# Patient Record
Sex: Male | Born: 1945
Health system: Southern US, Community
[De-identification: ages and names within clinical notes are randomized; demographics above are authoritative.]

## PROBLEM LIST (undated history)

## (undated) DIAGNOSIS — C61 Malignant neoplasm of prostate: Secondary | ICD-10-CM

## (undated) DIAGNOSIS — R351 Nocturia: Secondary | ICD-10-CM

## (undated) DIAGNOSIS — E119 Type 2 diabetes mellitus without complications: Secondary | ICD-10-CM

## (undated) DIAGNOSIS — Z923 Personal history of irradiation: Secondary | ICD-10-CM

## (undated) DIAGNOSIS — E785 Hyperlipidemia, unspecified: Secondary | ICD-10-CM

## (undated) DIAGNOSIS — H9313 Tinnitus, bilateral: Secondary | ICD-10-CM

## (undated) DIAGNOSIS — F1021 Alcohol dependence, in remission: Secondary | ICD-10-CM

## (undated) DIAGNOSIS — T884XXA Failed or difficult intubation, initial encounter: Secondary | ICD-10-CM

## (undated) HISTORY — DX: Failed or difficult intubation, initial encounter: T88.4XXA

## (undated) HISTORY — DX: Hyperlipidemia, unspecified: E78.5

---

## 1998-02-09 ENCOUNTER — Ambulatory Visit (HOSPITAL_COMMUNITY): Admission: RE | Admit: 1998-02-09 | Discharge: 1998-02-09 | Payer: Self-pay | Admitting: Family Medicine

## 2002-02-07 HISTORY — PX: COLONOSCOPY: SHX174

## 2005-06-29 ENCOUNTER — Ambulatory Visit: Payer: Self-pay | Admitting: Internal Medicine

## 2005-07-05 ENCOUNTER — Ambulatory Visit: Payer: Self-pay | Admitting: Internal Medicine

## 2005-07-28 ENCOUNTER — Ambulatory Visit: Payer: Self-pay | Admitting: Internal Medicine

## 2005-08-02 ENCOUNTER — Encounter: Admission: RE | Admit: 2005-08-02 | Discharge: 2005-10-31 | Payer: Self-pay | Admitting: Internal Medicine

## 2005-11-25 ENCOUNTER — Ambulatory Visit: Payer: Self-pay | Admitting: Internal Medicine

## 2005-11-25 LAB — CONVERTED CEMR LAB
ALT: 16 units/L (ref 0–40)
AST: 25 units/L (ref 0–37)
Creatinine,U: 58 mg/dL
Hgb A1c MFr Bld: 7 % — ABNORMAL HIGH (ref 4.6–6.0)
Microalb Creat Ratio: 3.4 mg/g (ref 0.0–30.0)
Microalb, Ur: 0.2 mg/dL (ref 0.0–1.9)

## 2005-11-29 ENCOUNTER — Encounter: Admission: RE | Admit: 2005-11-29 | Discharge: 2006-02-27 | Payer: Self-pay | Admitting: Internal Medicine

## 2011-10-27 ENCOUNTER — Encounter: Payer: Self-pay | Admitting: Gastroenterology

## 2011-10-28 ENCOUNTER — Encounter: Payer: Self-pay | Admitting: Internal Medicine

## 2011-10-28 DIAGNOSIS — E1165 Type 2 diabetes mellitus with hyperglycemia: Secondary | ICD-10-CM

## 2011-10-28 DIAGNOSIS — E119 Type 2 diabetes mellitus without complications: Secondary | ICD-10-CM | POA: Insufficient documentation

## 2011-10-28 DIAGNOSIS — Z0000 Encounter for general adult medical examination without abnormal findings: Secondary | ICD-10-CM | POA: Insufficient documentation

## 2011-11-02 ENCOUNTER — Encounter: Payer: Self-pay | Admitting: Internal Medicine

## 2011-11-02 ENCOUNTER — Other Ambulatory Visit (INDEPENDENT_AMBULATORY_CARE_PROVIDER_SITE_OTHER): Payer: Medicare HMO

## 2011-11-02 ENCOUNTER — Ambulatory Visit (INDEPENDENT_AMBULATORY_CARE_PROVIDER_SITE_OTHER): Payer: Medicare HMO | Admitting: Internal Medicine

## 2011-11-02 ENCOUNTER — Other Ambulatory Visit: Payer: Self-pay | Admitting: Internal Medicine

## 2011-11-02 VITALS — BP 142/98 | HR 67 | Temp 97.1°F | Ht 71.0 in | Wt 193.5 lb

## 2011-11-02 DIAGNOSIS — F22 Delusional disorders: Secondary | ICD-10-CM

## 2011-11-02 DIAGNOSIS — R972 Elevated prostate specific antigen [PSA]: Secondary | ICD-10-CM

## 2011-11-02 DIAGNOSIS — R030 Elevated blood-pressure reading, without diagnosis of hypertension: Secondary | ICD-10-CM

## 2011-11-02 DIAGNOSIS — Z125 Encounter for screening for malignant neoplasm of prostate: Secondary | ICD-10-CM

## 2011-11-02 DIAGNOSIS — Z0000 Encounter for general adult medical examination without abnormal findings: Secondary | ICD-10-CM

## 2011-11-02 DIAGNOSIS — IMO0001 Reserved for inherently not codable concepts without codable children: Secondary | ICD-10-CM

## 2011-11-02 DIAGNOSIS — Z7289 Other problems related to lifestyle: Secondary | ICD-10-CM | POA: Insufficient documentation

## 2011-11-02 LAB — CBC WITH DIFFERENTIAL/PLATELET
Basophils Relative: 0.6 % (ref 0.0–3.0)
Eosinophils Absolute: 0.1 10*3/uL (ref 0.0–0.7)
Eosinophils Relative: 1 % (ref 0.0–5.0)
Hemoglobin: 14 g/dL (ref 13.0–17.0)
Lymphocytes Relative: 15.2 % (ref 12.0–46.0)
MCHC: 32 g/dL (ref 30.0–36.0)
Neutro Abs: 5.3 10*3/uL (ref 1.4–7.7)
Neutrophils Relative %: 76.9 % (ref 43.0–77.0)
RBC: 5.34 Mil/uL (ref 4.22–5.81)
WBC: 6.8 10*3/uL (ref 4.5–10.5)

## 2011-11-02 LAB — URINALYSIS, ROUTINE W REFLEX MICROSCOPIC
Ketones, ur: NEGATIVE
Specific Gravity, Urine: 1.01 (ref 1.000–1.030)
Total Protein, Urine: NEGATIVE
Urine Glucose: NEGATIVE
Urobilinogen, UA: 0.2 (ref 0.0–1.0)
pH: 7 (ref 5.0–8.0)

## 2011-11-02 LAB — BASIC METABOLIC PANEL
BUN: 16 mg/dL (ref 6–23)
Calcium: 9.5 mg/dL (ref 8.4–10.5)
Creatinine, Ser: 1 mg/dL (ref 0.4–1.5)

## 2011-11-02 LAB — HEPATIC FUNCTION PANEL
ALT: 14 U/L (ref 0–53)
Bilirubin, Direct: 0.1 mg/dL (ref 0.0–0.3)
Total Bilirubin: 0.6 mg/dL (ref 0.3–1.2)
Total Protein: 7.5 g/dL (ref 6.0–8.3)

## 2011-11-02 LAB — LIPID PANEL
Cholesterol: 183 mg/dL (ref 0–200)
HDL: 55 mg/dL (ref >39.00)
LDL Cholesterol: 116 mg/dL — ABNORMAL HIGH (ref 0–99)
Total CHOL/HDL Ratio: 3
Triglycerides: 60 mg/dL (ref 0.0–149.0)

## 2011-11-02 LAB — PSA: PSA: 5.19 ng/mL — ABNORMAL HIGH (ref 0.10–4.00)

## 2011-11-02 LAB — TSH: TSH: 0.34 u[IU]/mL — ABNORMAL LOW (ref 0.35–5.50)

## 2011-11-02 LAB — HEMOGLOBIN A1C: Hgb A1c MFr Bld: 7.1 % — ABNORMAL HIGH (ref 4.6–6.5)

## 2011-11-02 LAB — MICROALBUMIN / CREATININE URINE RATIO: Microalb Creat Ratio: 1.6 mg/g (ref 0.0–30.0)

## 2011-11-02 MED ORDER — ASPIRIN 81 MG PO TBEC: 81.0000 mg | DELAYED_RELEASE_TABLET | Freq: Every day | ORAL | Status: DC

## 2011-11-02 NOTE — Assessment & Plan Note (Addendum)
Overall doing well, age appropriate education and counseling updated, referrals for preventative services and immunizations addressed, dietary and smoking counseling addressed, most recent labs and ECG reviewed.  I have personally reviewed and have noted: 1) the patient's medical and social history 2) The pt's use of alcohol, tobacco, and illicit drugs 3) The patient's current medications and supplements 4) Functional ability including ADL's, fall risk, home safety risk, hearing and visual impairment 5) Diet and physical activities 6) Evidence for depression or mood disorder 7) The patient's height, weight, and BMI have been recorded in the chart I have made referrals, and provided counseling and education based on review of the above Due for colonoscopy  - will refer, ECG reviewed as per emr

## 2011-11-02 NOTE — Patient Instructions (Addendum)
Please start the Aspirin at 81 mg per day - Enteric Coated only Please call if you change your mind about medication for blood pressure, and the flu shot, and the pneumonia shot Please check your blood pressure on a regular basis; your goal is to be less than 140/90 Please go to LAB in the Basement for the blood and/or urine tests to be done today You will be contacted by phone if any changes need to be made immediately.  Otherwise, you will receive a letter about your results with an explanation. Please remember to sign up for My Chart at your earliest convenience, as this will be important to you in the future with finding out test results. You will be contacted regarding the referral for: colonoscopy Please return in 1 year for your yearly visit, or sooner if needed, with Lab testing done 3-5 days before

## 2011-11-05 ENCOUNTER — Encounter: Payer: Self-pay | Admitting: Internal Medicine

## 2011-11-05 DIAGNOSIS — R030 Elevated blood-pressure reading, without diagnosis of hypertension: Secondary | ICD-10-CM | POA: Insufficient documentation

## 2011-11-05 NOTE — Assessment & Plan Note (Signed)
Pt declines low dose ace/arb today,  to f/u any worsening symptoms or concerns

## 2011-11-05 NOTE — Progress Notes (Signed)
Subjective:    Patient ID: Samuel Krueger, male    DOB: Jan 26, 1946, 66 y.o.   MRN: [id number]  HPI  Here for wellness and to establish as new pt;  Overall doing ok;  Pt denies CP, worsening SOB, DOE, wheezing, orthopnea, PND, worsening LE edema, palpitations, dizziness or syncope.  Pt denies neurological change such as new Headache, facial or extremity weakness.  Pt denies polydipsia, polyuria, or low sugar symptoms. Pt states overall good compliance with treatment and medications, good tolerability, and trying to follow lower cholesterol diet.  Pt denies worsening depressive symptoms, suicidal ideation or panic. No fever, wt loss, night sweats, loss of appetite, or other constitutional symptoms.  Pt states good ability with ADL's, low fall risk, home safety reviewed and adequate, no significant changes in hearing or vision, and occasionally active with exercise.  No acute complaints.  Due for colonoscopy, declines immunizations.  Past Medical History  Diagnosis Date  . Type II or unspecified type diabetes mellitus without mention of complication, uncontrolled 10/28/2011  . Abuse, drug or alcohol   . Alcohol use 11/02/2011    None for 20 yrs, s/p rehab  . Elevated PSA 11/02/2011   History reviewed. No pertinent past surgical history.  reports that he has never smoked. He has never used smokeless tobacco. He reports that he does not drink alcohol or use illicit drugs. family history includes Alcohol abuse in his other; Heart disease in his other; Hypertension in his other; and Mental illness in his other. Allergies  Allergen Reactions  . Metformin And Related Other (See Comments)    Chest tightness   No current outpatient prescriptions on file prior to visit.   Review of Systems Review of Systems  Constitutional: Negative for diaphoresis, activity change, appetite change and unexpected weight change.  HENT: Negative for hearing loss, ear pain, facial swelling, mouth sores and neck stiffness.     Eyes: Negative for pain, redness and visual disturbance.  Respiratory: Negative for shortness of breath and wheezing.   Cardiovascular: Negative for chest pain and palpitations.  Gastrointestinal: Negative for diarrhea, blood in stool, abdominal distention and rectal pain.  Genitourinary: Negative for hematuria, flank pain and decreased urine volume.  Musculoskeletal: Negative for myalgias and joint swelling.  Skin: Negative for color change and wound.  Neurological: Negative for syncope and numbness.  Hematological: Negative for adenopathy.  Psychiatric/Behavioral: Negative for hallucinations, self-injury, decreased concentration and agitation.      Objective:   Physical Exam BP 142/98  Pulse 67  Temp 97.1 F (36.2 C) (Oral)  Ht 5\' 11"  (1.803 m)  Wt 193 lb 8 oz (87.771 kg)  BMI 26.99 kg/m2  SpO2 98% Physical Exam  VS noted Constitutional: Pt is oriented to person, place, and time. Appears well-developed and well-nourished.  HENT:  Head: Normocephalic and atraumatic.  Right Ear: External ear normal.  Left Ear: External ear normal.  Nose: Nose normal.  Mouth/Throat: Oropharynx is clear and moist.  Eyes: Conjunctivae and EOM are normal. Pupils are equal, round, and reactive to light.  Neck: Normal range of motion. Neck supple. No JVD present. No tracheal deviation present.  Cardiovascular: Normal rate, regular rhythm, normal heart sounds and intact distal pulses.   Pulmonary/Chest: Effort normal and breath sounds normal.  Abdominal: Soft. Bowel sounds are normal. There is no tenderness.  Musculoskeletal: Normal range of motion. Exhibits no edema.  Lymphadenopathy:  Has no cervical adenopathy.  Neurological: Pt is alert and oriented to person, place, and time. Pt has normal reflexes.  No cranial nerve deficit.  Skin: Skin is warm and dry. No rash noted.  Psychiatric:  Has  normal mood and affect. Behavior is normal.     Assessment & Plan:

## 2011-11-05 NOTE — Assessment & Plan Note (Signed)
Diet controlled, to check a1c

## 2011-11-10 ENCOUNTER — Encounter: Payer: Self-pay | Admitting: Internal Medicine

## 2012-03-24 ENCOUNTER — Other Ambulatory Visit: Payer: Self-pay

## 2012-09-12 ENCOUNTER — Other Ambulatory Visit: Payer: Self-pay

## 2012-12-13 ENCOUNTER — Other Ambulatory Visit: Payer: Self-pay

## 2014-03-25 ENCOUNTER — Ambulatory Visit: Payer: Medicare HMO | Admitting: Internal Medicine

## 2014-03-26 DIAGNOSIS — C61 Malignant neoplasm of prostate: Secondary | ICD-10-CM | POA: Diagnosis not present

## 2014-03-26 HISTORY — PX: PROSTATE BIOPSY: SHX241

## 2014-05-19 DIAGNOSIS — C61 Malignant neoplasm of prostate: Secondary | ICD-10-CM | POA: Diagnosis not present

## 2014-05-21 ENCOUNTER — Encounter: Payer: Self-pay | Admitting: Radiation Oncology

## 2014-05-21 NOTE — Progress Notes (Signed)
GU Location of Tumor / Histology: Adenocarcinoma of the Prostate   If Prostate Cancer, Gleason Score is (3 + 4)  In 5 and 10% of 2 cores at the right apex and gleason 6 at the left apex with HGPIN at the right base. PSA is (7.75)  Frazier Richards     Past/Anticipated interventions by urology, if any:  Dr. Lowella Bandy - Biopsy of the Prostate  Past/Anticipated interventions by medical oncology, if any: None  Weight changes, if any:   Bowel/Bladder complaints, if any:  Nocturia x 2, frequency, weak stream intermittently.    Nausea/Vomiting, if any:   Pain issues, if any:    SAFETY ISSUES:  Prior radiation? No  Pacemaker/ICD? No  Possible current pregnancy? N/A  Is the patient on methotrexate? No  Current Complaints / other details:

## 2014-05-22 ENCOUNTER — Ambulatory Visit
Admission: RE | Admit: 2014-05-22 | Discharge: 2014-05-22 | Disposition: A | Discharge status: Home / Self Care | Payer: Commercial Managed Care - HMO | Source: Ambulatory Visit | Attending: Radiation Oncology | Admitting: Radiation Oncology

## 2014-05-22 ENCOUNTER — Ambulatory Visit: Payer: Commercial Managed Care - HMO

## 2014-05-22 DIAGNOSIS — C61 Malignant neoplasm of prostate: Secondary | ICD-10-CM | POA: Insufficient documentation

## 2014-05-22 HISTORY — DX: Malignant neoplasm of prostate: C61

## 2014-05-27 ENCOUNTER — Encounter: Payer: Self-pay | Admitting: Radiation Oncology

## 2014-05-27 ENCOUNTER — Ambulatory Visit
Admission: RE | Admit: 2014-05-27 | Discharge: 2014-05-27 | Disposition: A | Discharge status: Home / Self Care | Payer: Commercial Managed Care - HMO | Source: Ambulatory Visit | Attending: Radiation Oncology | Admitting: Radiation Oncology

## 2014-05-27 VITALS — BP 140/83 | HR 77 | Temp 98.6°F | Ht 71.0 in | Wt 194.8 lb

## 2014-05-27 DIAGNOSIS — C61 Malignant neoplasm of prostate: Secondary | ICD-10-CM | POA: Insufficient documentation

## 2014-05-27 NOTE — Progress Notes (Signed)
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 male who is seen today through the courtesy of Dr. Janice Norrie for discussion of possible radiation therapy in the management of his stage TIc intermediate risk adenocarcinoma prostate.  He presented with a PSA of 5.7 and was found have Gleason 6 (3+3) disease in 3 of 12 biopsies.  He elected for active surveillance but was poorly compliant for follow-up.  He returned for follow-up in November 2015 and his PSA was 7.75.  He underwent ultrasound-guided biopsies on 03/26/2014 and he was found have Gleason 7 (3+4) involving 5% and 10% of 2 cores from right lateral apex and Gleason 6 (3+3) in 5% of one core from the left lateral apex.  He is doing well from a GU and GI standpoint.  He states that he is able to have erections but he is not sexually active.  His I PSS score today is 8.  PREVIOUS RADIATION THERAPY: No   PAST MEDICAL HISTORY:  has a past medical history of Type II or unspecified type diabetes mellitus without mention of complication, uncontrolled (10/28/2011); Abuse, drug or alcohol; Alcohol use (11/02/2011); Elevated PSA (11/02/2011); and Prostate cancer.     PAST SURGICAL HISTORY:  Past Surgical History  Procedure Laterality Date  . Prostate biopsy  03/26/14     FAMILY HISTORY: family history includes Alcohol abuse in his mother; HIV/AIDS in his brother; Heart disease in his mother; Hypertension in his mother; Mental illness in his other.     SOCIAL HISTORY:  reports that he has never smoked. He has never used smokeless tobacco. He reports that he does not drink alcohol or use illicit drugs.  Widower for the past 32  years, one daughter age 62, 2 grandchildren ages 57 and 6.  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   ALLERGIES: Metformin and related   MEDICATIONS:  Current Outpatient Prescriptions  Medication Sig Dispense Refill  . aspirin 81 MG EC tablet Take 1 tablet (81 mg total) by mouth daily. Swallow whole. 30 tablet 12   No current facility-administered medications for this encounter.     REVIEW OF SYSTEMS:  Pertinent items are noted in HPI.    PHYSICAL EXAM:  height is 5\' 11"  (1.803 m) and weight is 194 lb 12.8 oz (88.361 kg). His temperature is 98.6 F (37 C). His blood pressure is 140/83 and his pulse is 77.   Alert and oriented 69 year old African-American male appearing younger than his stated age.  Nodes: Without palpable cervical or supraclavicular lymphadenopathy.  Chest: Lungs clear.  Abdomen: Without masses organomegaly.  Genitalia: Unremarkable to inspection.  Rectal: The prostate gland is slightly enlarged and is without focal induration or nodularity.  Extremities: Without edema.   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  I explained to the patient that his prognosis is related to his stage, PSA level, and Gleason score.  His stage and PSA level are favorable while his Gleason score of 7 is of intermediate favorability.  Other prognostic factors include disease volume.  He has low volume Gleason 7.  Management options include robotic surgery, continued active surveillance, or  radiation therapy.  Radiation therapy options include seed implantation with or without 5 weeks of external beam radiation therapy (3-D conformal) or 8 weeks of external beam/IMRT.  Since he does have low volume disease, I feel that he would be a candidate for seed implantation alone.  We discussed the potential acute and late toxicities of radiation therapy.  He does have a large gland volume, but he may not need downsizing if he has an open pubic arch.  After careful consideration, he would like to consider seed implantation alone.  We will move ahead with a CT arch study today.  Consent is signed today.  If he has a narrow arch relative to his prostate volume then he may be a candidate for downsizing of his gland for 3 months with androgen deprivation therapy.  We can discuss this further depending on the results of his CT arch study.   PLAN: As discussed above.  We will move ahead with his CT arch study.   I spent 45  minutes face to face with the patient and more than 50% of that time was spent in counseling and/or coordination of care.

## 2014-05-27 NOTE — Progress Notes (Signed)
CC: Dr. Lowella Bandy  Treatment planning/simulation note: The patient was taken to the CT simulator.  His pelvis was scanned.  The CT data set was sent to the planning system where contoured his prostate.  This was projected over the pubic arch.  His pubic arch is open.  His prostate volume is 66.6 mL compared to Dr. Sammie Bench measurement of 57.5 mL.  He is a candidate for seed implantation.  I plan to deliver 14,500 cGy utilizing I-125 seeds with the [employer] system.  We will get him scheduled with Dr. Janice Norrie in approximately 4-6 weeks.

## 2014-05-27 NOTE — Addendum Note (Signed)
Encounter addended by: Benn Moulder, RN on: 05/27/2014  3:27 PM<BR>     Documentation filed: Charges VN, Flowsheet VN

## 2014-05-28 ENCOUNTER — Telehealth: Payer: Self-pay | Admitting: *Deleted

## 2014-05-28 ENCOUNTER — Other Ambulatory Visit: Payer: Self-pay | Admitting: Urology

## 2014-05-28 NOTE — Telephone Encounter (Signed)
CALLED PATIENT TO INFORM OF IMPLANT DATE, LVM FOR A RETURN CALL 

## 2014-05-29 ENCOUNTER — Encounter (HOSPITAL_BASED_OUTPATIENT_CLINIC_OR_DEPARTMENT_OTHER): Admission: RE | Admit: 2014-05-29 | Payer: Commercial Managed Care - HMO | Source: Ambulatory Visit

## 2014-05-29 ENCOUNTER — Telehealth: Payer: Self-pay | Admitting: *Deleted

## 2014-05-29 ENCOUNTER — Ambulatory Visit (HOSPITAL_BASED_OUTPATIENT_CLINIC_OR_DEPARTMENT_OTHER)
Admission: RE | Admit: 2014-05-29 | Discharge: 2014-05-29 | Disposition: A | Discharge status: Home / Self Care | Payer: Commercial Managed Care - HMO | Source: Ambulatory Visit | Attending: Urology | Admitting: Urology

## 2014-05-29 DIAGNOSIS — E119 Type 2 diabetes mellitus without complications: Secondary | ICD-10-CM | POA: Insufficient documentation

## 2014-05-29 NOTE — Telephone Encounter (Signed)
Called patient to inform that implant has been moved from 07-08-14 to 07-15-14, spoke with patient and he is aware of this change.

## 2014-07-03 ENCOUNTER — Telehealth: Payer: Self-pay | Admitting: *Deleted

## 2014-07-03 NOTE — Telephone Encounter (Signed)
CALLED PATIENT TO REMIND OF LABS FOR 07-08-14 FOR IMPLANT ON 07-15-14, SPOKE WITH PATIENT AND HE IS AWARE OF THESE LABS

## 2014-07-04 ENCOUNTER — Encounter (HOSPITAL_BASED_OUTPATIENT_CLINIC_OR_DEPARTMENT_OTHER): Payer: Self-pay | Admitting: *Deleted

## 2014-07-08 ENCOUNTER — Encounter (HOSPITAL_BASED_OUTPATIENT_CLINIC_OR_DEPARTMENT_OTHER): Payer: Self-pay | Admitting: *Deleted

## 2014-07-08 DIAGNOSIS — R351 Nocturia: Secondary | ICD-10-CM | POA: Diagnosis not present

## 2014-07-08 DIAGNOSIS — Z91013 Allergy to seafood: Secondary | ICD-10-CM | POA: Diagnosis not present

## 2014-07-08 DIAGNOSIS — Z888 Allergy status to other drugs, medicaments and biological substances status: Secondary | ICD-10-CM | POA: Diagnosis not present

## 2014-07-08 DIAGNOSIS — Z7982 Long term (current) use of aspirin: Secondary | ICD-10-CM | POA: Diagnosis not present

## 2014-07-08 DIAGNOSIS — E119 Type 2 diabetes mellitus without complications: Secondary | ICD-10-CM | POA: Diagnosis not present

## 2014-07-08 DIAGNOSIS — C61 Malignant neoplasm of prostate: Secondary | ICD-10-CM | POA: Diagnosis not present

## 2014-07-08 DIAGNOSIS — Z87891 Personal history of nicotine dependence: Secondary | ICD-10-CM | POA: Diagnosis not present

## 2014-07-08 DIAGNOSIS — F1010 Alcohol abuse, uncomplicated: Secondary | ICD-10-CM | POA: Diagnosis not present

## 2014-07-08 LAB — CBC
HEMATOCRIT: 43.5 % (ref 39.0–52.0)
HEMOGLOBIN: 13.8 g/dL (ref 13.0–17.0)
MCH: 26.1 pg (ref 26.0–34.0)
MCHC: 31.7 g/dL (ref 30.0–36.0)
MCV: 82.2 fL (ref 78.0–100.0)
Platelets: 175 10*3/uL (ref 150–400)
RBC: 5.29 MIL/uL (ref 4.22–5.81)
RDW: 13.5 % (ref 11.5–15.5)
WBC: 7.4 10*3/uL (ref 4.0–10.5)

## 2014-07-08 LAB — COMPREHENSIVE METABOLIC PANEL
ALT: 13 U/L — AB (ref 17–63)
AST: 22 U/L (ref 15–41)
Albumin: 4.3 g/dL (ref 3.5–5.0)
Alkaline Phosphatase: 50 U/L (ref 38–126)
Anion gap: 9 (ref 5–15)
BUN: 17 mg/dL (ref 6–20)
CALCIUM: 9.5 mg/dL (ref 8.9–10.3)
CO2: 27 mmol/L (ref 22–32)
CREATININE: 0.94 mg/dL (ref 0.61–1.24)
Chloride: 104 mmol/L (ref 101–111)
GFR calc Af Amer: >60 mL/min (ref >60)
GFR calc non Af Amer: >60 mL/min (ref >60)
Glucose, Bld: 162 mg/dL — ABNORMAL HIGH (ref 65–99)
POTASSIUM: 4.2 mmol/L (ref 3.5–5.1)
Sodium: 140 mmol/L (ref 135–145)
Total Bilirubin: 0.5 mg/dL (ref 0.3–1.2)
Total Protein: 7.8 g/dL (ref 6.5–8.1)

## 2014-07-08 LAB — PROTIME-INR
INR: 0.99 (ref 0.00–1.49)
PROTHROMBIN TIME: 13.3 s (ref 11.6–15.2)

## 2014-07-08 LAB — APTT: aPTT: 33 seconds (ref 24–37)

## 2014-07-08 NOTE — Progress Notes (Signed)
NPO AFTER MN.  ARRIVE AT 0800.  CURRENT LAB WORK DONE TODAY, CXR , AND EKG IN CHART AND EPIC. WILL TAKE FLEET ENEMA AM DOS.

## 2014-07-11 ENCOUNTER — Telehealth: Payer: Self-pay | Admitting: *Deleted

## 2014-07-11 NOTE — Telephone Encounter (Signed)
CALLED PATIENT TO REMIND OF PROCEDURE FOR 07-15-14, SPOKE WITH PATIENT AND HE IS AWARE OF THIS PROCEDURE.

## 2014-07-14 DIAGNOSIS — C61 Malignant neoplasm of prostate: Secondary | ICD-10-CM | POA: Diagnosis not present

## 2014-07-14 NOTE — H&P (Signed)
Urology Admission H&P  Chief Complaint: Adenocarcinoma of prostate, stage TIc  History of Present Illness: Patient is a 69 years old male who was diagnosed with adenocarcinoma of prostate Gleason score 6 in February 2014. His PSA at that time was 5.71. He did not return for his follow-up visit. He came back to the office 2 years later. He came back to the office 2 years later. In February 2016 he had another prostate biopsy that revealed Gleason 3+4 at the right apex and 3+3 at the left apex. He was seen in consultation by Dr. Valere Dross and he elected then to have seeds implantation. He is scheduled today for the procedure.  Past Medical History  Diagnosis Date  . Recovering alcoholic in remission     since rehab 20 yrs ago-- approx 1995  . Prostate cancer urologist-- dr Janice Norrie    active survellance since dx Feb 2014--  now has Stage T1c, Gleason (3+4)7, PSA 7.75  . Tinnitus of both ears   . Type 2 diabetes, diet controlled   . Nocturia    Past Surgical History  Procedure Laterality Date  . Prostate biopsy  03/26/14    Home Medications:  No prescriptions prior to admission   Allergies:  Allergies  Allergen Reactions  . Metformin And Related Other (See Comments)    Chest tightness  . Shrimp [Shellfish Allergy] Other (See Comments)    "strange GI reaction"    Family History  Problem Relation Age of Onset  . Alcohol abuse Mother   . Heart disease Mother   . Hypertension Mother   . Mental illness Other   . HIV/AIDS Brother    Social History:  reports that he quit smoking about 14 years ago. His smoking use included Cigarettes. He quit after .8 years of use. He has never used smokeless tobacco. He reports that he does not drink alcohol or use illicit drugs.  Review of Systems  Constitutional: Negative.   HENT: Negative.   Eyes: Negative.   Respiratory: Negative.   Cardiovascular: Negative.   Gastrointestinal: Negative.   Genitourinary: Negative.   Musculoskeletal: Negative.    Neurological: Negative.   Endo/Heme/Allergies: Negative.   Psychiatric/Behavioral: Negative.     Physical Exam:  Vital signs in last 24 hours:   Physical Exam  Constitutional: He is oriented to person, place, and time. He appears well-developed and well-nourished.  HENT:  Head: Normocephalic.  Eyes: Pupils are equal, round, and reactive to light.  Neck: Normal range of motion. No tracheal deviation present. No thyromegaly present.  Cardiovascular: Normal rate and normal heart sounds.  Exam reveals no gallop.   No murmur heard. Respiratory: Breath sounds normal. No respiratory distress. He exhibits no tenderness.  GI: Soft. Bowel sounds are normal. He exhibits no distension and no mass. There is no tenderness. There is no rebound and no guarding.  Genitourinary: Rectum normal, prostate normal and penis normal.  Musculoskeletal: Normal range of motion. He exhibits no edema.  Lymphadenopathy:    He has no cervical adenopathy.  Neurological: He is alert and oriented to person, place, and time.  Skin: Skin is warm and dry.  Psychiatric: He has a normal mood and affect.    Laboratory Data:  No results found for this or any previous visit (from the past 24 hour(s)). No results found for this or any previous visit (from the past 240 hour(s)). Creatinine:  Recent Labs  07/08/14 0849  CREATININE 0.94     Impression/Assessment:  Adenocarcinoma of prostate, stage TIc  Plan:  I-125 seeds implantation.  Arvil Persons 07/14/2014, 7:23 PM

## 2014-07-15 ENCOUNTER — Ambulatory Visit (HOSPITAL_COMMUNITY): Payer: Commercial Managed Care - HMO

## 2014-07-15 ENCOUNTER — Encounter: Payer: Self-pay | Admitting: Radiation Oncology

## 2014-07-15 ENCOUNTER — Encounter (HOSPITAL_BASED_OUTPATIENT_CLINIC_OR_DEPARTMENT_OTHER): Payer: Self-pay | Admitting: Anesthesiology

## 2014-07-15 ENCOUNTER — Ambulatory Visit (HOSPITAL_BASED_OUTPATIENT_CLINIC_OR_DEPARTMENT_OTHER): Payer: Commercial Managed Care - HMO | Admitting: Anesthesiology

## 2014-07-15 ENCOUNTER — Encounter (HOSPITAL_BASED_OUTPATIENT_CLINIC_OR_DEPARTMENT_OTHER)
Admission: RE | Disposition: A | Discharge status: Home / Self Care | Payer: Self-pay | Source: Ambulatory Visit | Attending: Urology

## 2014-07-15 ENCOUNTER — Ambulatory Visit (HOSPITAL_BASED_OUTPATIENT_CLINIC_OR_DEPARTMENT_OTHER)
Admission: RE | Admit: 2014-07-15 | Discharge: 2014-07-15 | Disposition: A | Discharge status: Home / Self Care | Payer: Commercial Managed Care - HMO | Source: Ambulatory Visit | Attending: Urology | Admitting: Urology

## 2014-07-15 DIAGNOSIS — Z91013 Allergy to seafood: Secondary | ICD-10-CM | POA: Diagnosis not present

## 2014-07-15 DIAGNOSIS — Z888 Allergy status to other drugs, medicaments and biological substances status: Secondary | ICD-10-CM | POA: Diagnosis not present

## 2014-07-15 DIAGNOSIS — Z87891 Personal history of nicotine dependence: Secondary | ICD-10-CM | POA: Insufficient documentation

## 2014-07-15 DIAGNOSIS — C61 Malignant neoplasm of prostate: Secondary | ICD-10-CM | POA: Insufficient documentation

## 2014-07-15 DIAGNOSIS — E119 Type 2 diabetes mellitus without complications: Secondary | ICD-10-CM | POA: Diagnosis not present

## 2014-07-15 DIAGNOSIS — Z923 Personal history of irradiation: Secondary | ICD-10-CM

## 2014-07-15 DIAGNOSIS — R351 Nocturia: Secondary | ICD-10-CM | POA: Insufficient documentation

## 2014-07-15 DIAGNOSIS — Z7982 Long term (current) use of aspirin: Secondary | ICD-10-CM | POA: Insufficient documentation

## 2014-07-15 DIAGNOSIS — Z01818 Encounter for other preprocedural examination: Secondary | ICD-10-CM

## 2014-07-15 DIAGNOSIS — F1010 Alcohol abuse, uncomplicated: Secondary | ICD-10-CM | POA: Insufficient documentation

## 2014-07-15 HISTORY — DX: Nocturia: R35.1

## 2014-07-15 HISTORY — DX: Alcohol dependence, in remission: F10.21

## 2014-07-15 HISTORY — DX: Personal history of irradiation: Z92.3

## 2014-07-15 HISTORY — DX: Type 2 diabetes mellitus without complications: E11.9

## 2014-07-15 HISTORY — DX: Tinnitus, bilateral: H93.13

## 2014-07-15 HISTORY — PX: RADIOACTIVE SEED IMPLANT: SHX5150

## 2014-07-15 LAB — GLUCOSE, CAPILLARY
Glucose-Capillary: 95 mg/dL (ref 65–99)
Glucose-Capillary: 172 mg/dL — ABNORMAL HIGH (ref 65–99)

## 2014-07-15 SURGERY — INSERTION, RADIATION SOURCE, PROSTATE
Anesthesia: General | Site: Rectum

## 2014-07-15 MED ORDER — LACTATED RINGERS IV SOLN
INTRAVENOUS | Status: DC
  Administered 2014-07-15 (×3): via INTRAVENOUS
  Filled 2014-07-15: qty 1000

## 2014-07-15 MED ORDER — EPHEDRINE SULFATE 50 MG/ML IJ SOLN
INTRAMUSCULAR | Status: DC | PRN
  Administered 2014-07-15: 10 mg via INTRAVENOUS

## 2014-07-15 MED ORDER — IOHEXOL 350 MG/ML SOLN
INTRAVENOUS | Status: DC | PRN
  Administered 2014-07-15: 7 mL

## 2014-07-15 MED ORDER — SUCCINYLCHOLINE CHLORIDE 20 MG/ML IJ SOLN
INTRAMUSCULAR | Status: DC | PRN
  Administered 2014-07-15: 100 mg via INTRAVENOUS

## 2014-07-15 MED ORDER — ACETAMINOPHEN 10 MG/ML IV SOLN
INTRAVENOUS | Status: DC | PRN
  Administered 2014-07-15: 1000 mg via INTRAVENOUS

## 2014-07-15 MED ORDER — ONDANSETRON HCL 4 MG/2ML IJ SOLN
INTRAMUSCULAR | Status: DC | PRN
  Administered 2014-07-15: 4 mg via INTRAVENOUS

## 2014-07-15 MED ORDER — DEXAMETHASONE SODIUM PHOSPHATE 4 MG/ML IJ SOLN
INTRAMUSCULAR | Status: DC | PRN
  Administered 2014-07-15: 10 mg via INTRAVENOUS

## 2014-07-15 MED ORDER — FLEET ENEMA 7-19 GM/118ML RE ENEM
1.0000 | ENEMA | Freq: Once | RECTAL | Status: DC
Start: 2014-07-16 — End: 2014-07-15
  Filled 2014-07-15: qty 1

## 2014-07-15 MED ORDER — CIPROFLOXACIN IN D5W 400 MG/200ML IV SOLN
INTRAVENOUS | Status: AC
  Filled 2014-07-15: qty 200

## 2014-07-15 MED ORDER — FENTANYL CITRATE (PF) 100 MCG/2ML IJ SOLN
INTRAMUSCULAR | Status: AC
  Filled 2014-07-15: qty 4

## 2014-07-15 MED ORDER — KETOROLAC TROMETHAMINE 30 MG/ML IJ SOLN
INTRAMUSCULAR | Status: DC | PRN
  Administered 2014-07-15: 30 mg via INTRAVENOUS

## 2014-07-15 MED ORDER — LIDOCAINE HCL (CARDIAC) 20 MG/ML IV SOLN
INTRAVENOUS | Status: DC | PRN
  Administered 2014-07-15: 100 mg via INTRAVENOUS

## 2014-07-15 MED ORDER — STERILE WATER FOR IRRIGATION IR SOLN
Status: DC | PRN
  Administered 2014-07-15: 3500 mL

## 2014-07-15 MED ORDER — CIPROFLOXACIN IN D5W 400 MG/200ML IV SOLN
400.0000 mg | INTRAVENOUS | Status: AC
  Administered 2014-07-15: 400 mg via INTRAVENOUS
  Filled 2014-07-15: qty 200

## 2014-07-15 MED ORDER — FENTANYL CITRATE (PF) 100 MCG/2ML IJ SOLN
INTRAMUSCULAR | Status: DC | PRN
  Administered 2014-07-15 (×2): 50 ug via INTRAVENOUS

## 2014-07-15 MED ORDER — PROPOFOL 10 MG/ML IV BOLUS
INTRAVENOUS | Status: DC | PRN
Start: 2014-07-15 — End: 2014-07-15
  Administered 2014-07-15: 200 mg via INTRAVENOUS
  Administered 2014-07-15: 50 mg via INTRAVENOUS
  Administered 2014-07-15: 150 mg via INTRAVENOUS
  Administered 2014-07-15: 50 mg via INTRAVENOUS

## 2014-07-15 MED ORDER — MIDAZOLAM HCL 2 MG/2ML IJ SOLN
INTRAMUSCULAR | Status: AC
  Filled 2014-07-15: qty 2

## 2014-07-15 MED ORDER — NEOSTIGMINE METHYLSULFATE 10 MG/10ML IV SOLN
INTRAVENOUS | Status: DC | PRN
  Administered 2014-07-15: 5 mg via INTRAVENOUS

## 2014-07-15 MED ORDER — GLYCOPYRROLATE 0.2 MG/ML IJ SOLN
INTRAMUSCULAR | Status: DC | PRN
  Administered 2014-07-15: 0.6 mg via INTRAVENOUS
  Administered 2014-07-15: 0.2 mg via INTRAVENOUS

## 2014-07-15 MED ORDER — ROCURONIUM BROMIDE 100 MG/10ML IV SOLN
INTRAVENOUS | Status: DC | PRN
  Administered 2014-07-15: 50 mg via INTRAVENOUS
  Administered 2014-07-15: 10 mg via INTRAVENOUS

## 2014-07-15 SURGICAL SUPPLY — 25 items
BAG URINE DRAINAGE (UROLOGICAL SUPPLIES) ×3 IMPLANT
BLADE CLIPPER SURG (BLADE) ×3 IMPLANT
CATH FOLEY 2WAY SLVR  5CC 16FR (CATHETERS) ×4
CATH FOLEY 2WAY SLVR 5CC 16FR (CATHETERS) ×2 IMPLANT
CATH ROBINSON RED A/P 20FR (CATHETERS) ×3 IMPLANT
CLOTH BEACON ORANGE TIMEOUT ST (SAFETY) ×3 IMPLANT
COVER BACK TABLE 60X90IN (DRAPES) ×3 IMPLANT
COVER MAYO STAND STRL (DRAPES) ×3 IMPLANT
DRSG TEGADERM 4X4.75 (GAUZE/BANDAGES/DRESSINGS) ×3 IMPLANT
DRSG TEGADERM 8X12 (GAUZE/BANDAGES/DRESSINGS) ×3 IMPLANT
GLOVE BIO SURGEON STRL SZ 6.5 (GLOVE) ×1 IMPLANT
GLOVE BIO SURGEON STRL SZ7 (GLOVE) ×6 IMPLANT
GLOVE BIO SURGEON STRL SZ7.5 (GLOVE) ×8 IMPLANT
GLOVE BIO SURGEONS STRL SZ 6.5 (GLOVE) ×1
GLOVE BIOGEL PI IND STRL 6.5 (GLOVE) IMPLANT
GLOVE BIOGEL PI INDICATOR 6.5 (GLOVE) ×2
GLOVE SURG SS PI 7.5 STRL IVOR (GLOVE) ×2 IMPLANT
HOLDER FOLEY CATH W/STRAP (MISCELLANEOUS) ×3 IMPLANT
IV WATER IRRIG STERILE 3000ML (IV SOLUTION) ×3 IMPLANT
PACK CYSTO (CUSTOM PROCEDURE TRAY) ×3 IMPLANT
SPONGE GAUZE 4X4 12PLY STER LF (GAUZE/BANDAGES/DRESSINGS) ×2 IMPLANT
SYRINGE 10CC LL (SYRINGE) ×3 IMPLANT
UNDERPAD 30X30 INCONTINENT (UNDERPADS AND DIAPERS) ×6 IMPLANT
WATER STERILE IRR 500ML POUR (IV SOLUTION) ×3 IMPLANT
selectSeed I-125 ×148 IMPLANT

## 2014-07-15 NOTE — Progress Notes (Signed)
CC: Dr. 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  Narrative: The patient appears to have undergone a successful Nucletron seed implant with Dr. Janice Norrie.  Plan: Follow-up visit with Dr. Janice Norrie and myself and approximate 3 weeks.  We will obtain a CT scan at that time to assess the quality of his implant by performing post implant dosimetry.

## 2014-07-15 NOTE — Anesthesia Preprocedure Evaluation (Addendum)
Anesthesia Evaluation  Patient identified by MRN, date of birth, ID band Patient awake    Reviewed: Allergy & Precautions, NPO status , Patient's Chart, lab work & pertinent test results  Airway Mallampati: II  TM Distance: >3 FB Neck ROM: Full    Dental no notable dental hx. (+) Teeth Intact, Poor Dentition, Dental Advisory Given,    Pulmonary neg pulmonary ROS, former smoker,  breath sounds clear to auscultation  Pulmonary exam normal       Cardiovascular Exercise Tolerance: Good negative cardio ROS Normal cardiovascular examRhythm:Regular Rate:Normal     Neuro/Psych negative neurological ROS  negative psych ROS   GI/Hepatic negative GI ROS, Neg liver ROS,   Endo/Other  diabetes, Well Controlled, Type 2  Renal/GU negative Renal ROS  negative genitourinary   Musculoskeletal negative musculoskeletal ROS (+)   Abdominal   Peds negative pediatric ROS (+)  Hematology negative hematology ROS (+)   Anesthesia Other Findings Pt states he is a  "Recovering alcoholic"  Post 16+ yrs (please taper pain medicine as needed).  Reproductive/Obstetrics negative OB ROS                           Anesthesia Physical Anesthesia Plan  ASA: II  Anesthesia Plan: General   Post-op Pain Management:    Induction: Intravenous  Airway Management Planned: LMA  Additional Equipment:   Intra-op Plan:   Post-operative Plan: Extubation in OR  Informed Consent: I have reviewed the patients History and Physical, chart, labs and discussed the procedure including the risks, benefits and alternatives for the proposed anesthesia with the patient or authorized representative who has indicated his/her understanding and acceptance.   Dental advisory given  Plan Discussed with: CRNA and Surgeon  Anesthesia Plan Comments:         Anesthesia Quick Evaluation

## 2014-07-15 NOTE — Progress Notes (Signed)
[facility] Brachytherapy Operative Procedure Note  Name: Samuel Krueger MRN: [id number]  Date:   05/28/2014           DOB: Jul 14, 1945  Status:outpatient    YV:OPFYT Jenny Reichmann, MD  Dr. Lowella Bandy DIAGNOSIS: A 69 year old gentlemen with stage T1c adenocarcinoma of the prostate with a Gleason of 7 and a PSA of 7.75.  PROCEDURE: Insertion of radioactive I-125 seeds into the prostate gland.  RADIATION DOSE: 145 Gy, definitive therapy.  TECHNIQUE: Travaughn Vue was brought to the operating room with Dr. Janice Norrie. He was placed in the dorsolithotomy position. He was catheterized and a rectal tube was inserted. The perineum was shaved, prepped and draped. The ultrasound probe was then introduced into the rectum to see the prostate gland.  TREATMENT DEVICE: A needle grid was attached to the ultrasound probe stand and anchor needles were placed.  COMPLEX ISODOSE CALCULATION: The prostate was imaged in 3D using a sagittal sweep of the prostate probe. These images were transferred to the planning computer. There, the prostate, urethra and rectum were defined on each axial reconstructed image. Then, the software created an optimized plan and a few seed positions were adjusted. Then the accepted plan was uploaded to the seed Selectron afterloading unit.  SPECIAL TREATMENT PROCEDURE/SUPERVISION AND HANDLING: The Nucletron FIRST system was used to place the needles under sagittal guidance. A total of 30 needles were used to deposit 74 seeds in the prostate gland. The individual seed activity was 0.658 mCi for a total implant activity of 48.69 mCi.  COMPLEX SIMULATION: At the end of the procedure, an anterior radiograph of the pelvis was obtained to document seed positioning and count. Cystoscopy was performed to check the urethra and bladder.  MICRODOSIMETRY: At the end of the procedure, the patient was emitting 0.14 mrem/hr at 1 meter. Accordingly, he was considered safe for hospital  discharge.  PLAN: The patient will return to the radiation oncology clinic for post implant CT dosimetry in three weeks.

## 2014-07-15 NOTE — Anesthesia Postprocedure Evaluation (Signed)
  Anesthesia Post-op Note  Patient: Samuel Krueger  Procedure(s) Performed: Procedure(s) (LRB): RADIOACTIVE SEED IMPLANT/BRACHYTHERAPY IMPLANT (N/A)  Patient Location: PACU  Anesthesia Type: General  Level of Consciousness: awake and alert   Airway and Oxygen Therapy: Patient Spontanous Breathing  Post-op Pain: mild  Post-op Assessment: Post-op Vital signs reviewed, Patient's Cardiovascular Status Stable, Respiratory Function Stable, Patent Airway and No signs of Nausea or vomiting  Last Vitals:  Filed Vitals:   07/15/14 1219  BP: 133/84  Pulse: 77  Temp: 35.6 C  Resp: 13    Post-op Vital Signs: stable   Complications: No apparent anesthesia complications

## 2014-07-15 NOTE — Anesthesia Procedure Notes (Signed)
Procedure Name: Intubation Date/Time: 07/15/2014 9:45 AM Performed by: Wanita Chamberlain Pre-anesthesia Checklist: Patient identified, Timeout performed, Emergency Drugs available, Patient being monitored and Suction available Patient Re-evaluated:Patient Re-evaluated prior to inductionOxygen Delivery Method: Circle system utilized Preoxygenation: Pre-oxygenation with 100% oxygen Intubation Type: IV induction and Cricoid Pressure applied Ventilation: Mask ventilation without difficulty Laryngoscope Size: Mac and 4 Grade View: Grade III Tube type: Oral Tube size: 8.0 mm Number of attempts: 4 Airway Equipment and Method: Bougie stylet and Stylet Placement Confirmation: ETT inserted through vocal cords under direct vision,  positive ETCO2 and breath sounds checked- equal and bilateral Secured at: 23 cm Tube secured with: Tape Dental Injury: Teeth and Oropharynx as per pre-operative assessment  Difficulty Due To: Difficulty was unanticipated and Difficult Airway- due to reduced neck mobility Comments: "floppy epiglottis" Laryngoscopy X 2 Vayda Dungee one with bougie. Dr Kalman Shan laryngoscopy X 2 intubated with bougie

## 2014-07-15 NOTE — Op Note (Signed)
Samuel Krueger is a 69 y.o.   07/15/2014  General  Preop diagnosis: Adenocarcinoma of prostate, stage TIc.  Postop diagnosis: Same  Procedure done: I-125 seeds implantation, cystoscopy  Surgeons: Charlene Brooke. Conner Muegge and Arloa Koh  Anesthesia: Gen.  Indication: Patient is a 69 years old male who was diagnosed with prostate cancer Gleason 6 in February 2014. His PSA at diagnosis was 5.71. He was on active surveillance. His PSA went up to 7.75 in November 2015. Repeat biopsy showed a Gleason 7 at the right apex and 6 at the left apex. He then so Dr. Valere Dross in consultation and he elected to have seeds implantation. He is scheduled today for the procedure.  Procedure: Patient was identified by his wrist band and proper timeout was taken.  Under general anesthesia he was prepped and draped and placed in the dorsolithotomy position. A #16 French Foley catheter was inserted in the bladder. Then ultrasound planning was done by Dr. Valere Dross. When planning was completed on ultrasound guidance and with the Nucletron a total of 74 seeds through 30 needles were inserted in the prostate. There appears to be good seeds distribution on fluoroscopy. Then the Foley catheter was removed. A flexible cystoscope was then inserted in the bladder. The anterior urethra is normal. There is trilobar prostatic hypertrophy. The bladder mucosa is normal. There is no stone or tumor in the bladder. The ureteral orifices are in normal position and shape. There are no seeds in the bladder. The cystoscope was then removed.  A #16 French Foley catheter was then reinserted in the bladder.  Patient tolerated the procedure well and left the OR in satisfactory condition to postanesthesia care unit   EBL: Minimal

## 2014-07-15 NOTE — Transfer of Care (Signed)
Immediate Anesthesia Transfer of Care Note  Patient: Samuel Krueger  Procedure(s) Performed: Procedure(s): RADIOACTIVE SEED IMPLANT/BRACHYTHERAPY IMPLANT (N/A)  Patient Location: PACU  Anesthesia Type:General  Level of Consciousness: awake, alert , oriented and patient cooperative  Airway & Oxygen Therapy: Patient Spontanous Breathing and Patient connected to nasal cannula oxygen  Post-op Assessment: Report given to RN and Post -op Vital signs reviewed and stable  Post vital signs: Reviewed and stable  Last Vitals:  Filed Vitals:   07/15/14 0900  BP: 142/80  Pulse: 62  Temp: 37 C  Resp: 16    Complications: No apparent anesthesia complications

## 2014-07-15 NOTE — Discharge Instructions (Signed)
Radioactive Seed Implant Home Care Instructions   Activity:    Rest for the remainder of the day.  Do not drive or operate equipment today.  You may resume normal  activities in a few days as instructed by your physician, without risk of harmful radiation exposure to those around you, provided you follow the time and distance precautions on the Radiation Oncology Instruction Sheet.   Meals: Drink plenty of lipuids and eat light foods, such as gelatin or soup this evening .  You may return to normal meal plan tomorrow.  Return To Work: You may return to work as instructed by [employer].  Special Instruction:   If any seeds are found, use tweezers to pick up seeds and place in a glass container of any kind and bring to your physician's office.  Call your physician if any of these symptoms occur:   Persistent or heavy bleeding  Urine stream diminishes or stops completely after catheter is removed  Fever equal to or greater than 101 degrees F  Cloudy urine with a strong foul odor  Severe pain  You may feel some burning pain and/or hesitancy when you urinate after the catheter is removed.  These symptoms may increase over the next few weeks, but should diminish within forur to six weeks.  Applying moist heat to the lower abdomen or a hot tub bath may help relieve the pain.  If the discomfort becomes severe, please call your physician for additional medications.  Follow-up (Date of Return Visit to Physician):   Indwelling Urinary Catheter Care You have been given a flexible tube (catheter) used to drain the bladder. Catheters are often used when a person has difficulty urinating due to blockage, bleeding, infection, or inability to control bladder or bowel movements (incontinence). A catheter requires daily care to prevent infection and blockage. HOME CARE INSTRUCTIONS  Do the following to reduce the risk of infection. Antibiotic medicines cannot prevent infections. Limit the number  of bacteria entering your bladder  Wash your hands for 2 minutes with soapy water before and after handling the catheter.  Wash your bottom and the entire catheter twice daily, as well as after each bowel movement. Wash the tip of the penis or just above the vaginal opening with soap and warm water, rinse, and then wash the rectal area. Always wash from front to back.  When changing from the leg bag to overnight bag or from the overnight bag to leg bag, thoroughly clean the end of the catheter where it connects to the tubing with an alcohol wipe.  Clean the leg bag and overnight bag daily after use. Replace your drainage bags weekly.  Always keep the tubing and bag below the level of your bladder. This allows your urine to drain properly. Lifting the bag or tubing above the level of your bladder will cause dirty urine to flow back into your bladder. If you must briefly lift the bag higher than your bladder, pinch the catheter or tubing to prevent backflow.  Drink enough water and fluids to keep your urine clear or pale yellow, or as directed by your caregiver. This will flush bacteria out of the bladder. Protect tissues from injury  Attach the catheter to your leg so there is no tension on the catheter. Use adhesive tape or a leg strap. If you are using adhesive tape, remove any sticky residue left behind by the previous tape you used.  Place your leg bag on your lower leg. Fasten the straps  securely and comfortably.  Do not remove the catheter yourself unless you have been instructed how to do so. Keep the urinary pathway open  Check throughout the day to be sure your catheter is working and urine is draining freely. Make sure the tubing does not become kinked.  Do not let the drainage bag overfill. SEEK IMMEDIATE MEDICAL CARE IF:   The catheter becomes blocked. Urine is not draining.  Urine is leaking.  You have any pain.  You have a fever. Document Released: 01/24/2005 Document  Revised: 01/11/2012 Document Reviewed: 06/25/2009 [facility] Patient Information 2015 [place], [place]. This information is not intended to replace advice given to you by your health care provider. Make sure you discuss any questions you have with your health care provider.        Post Anesthesia Home Care Instructions  Activity: Get plenty of rest for the remainder of the day. A responsible adult should stay with you for 24 hours following the procedure.  For the next 24 hours, DO NOT: -Drive a car -Paediatric nurse -Drink alcoholic beverages -Take any medication unless instructed by your physician -Make any legal decisions or sign important papers.  Meals: Start with liquid foods such as gelatin or soup. Progress to regular foods as tolerated. Avoid greasy, spicy, heavy foods. If nausea and/or vomiting occur, drink only clear liquids until the nausea and/or vomiting subsides. Call your physician if vomiting continues.  Special Instructions/Symptoms: Your throat may feel dry or sore from the anesthesia or the breathing tube placed in your throat during surgery. If this causes discomfort, gargle with warm salt water. The discomfort should disappear within 24 hours.  If you had a scopolamine patch placed behind your ear for the management of post- operative nausea and/or vomiting:  1. The medication in the patch is effective for 72 hours, after which it should be removed.  Wrap patch in a tissue and discard in the trash. Wash hands thoroughly with soap and water. 2. You may remove the patch earlier than 72 hours if you experience unpleasant side effects which may include dry mouth, dizziness or visual disturbances. 3. Avoid touching the patch. Wash your hands with soap and water after contact with the patch.

## 2014-07-16 ENCOUNTER — Encounter (HOSPITAL_BASED_OUTPATIENT_CLINIC_OR_DEPARTMENT_OTHER): Payer: Self-pay | Admitting: Urology

## 2014-07-17 DIAGNOSIS — N401 Enlarged prostate with lower urinary tract symptoms: Secondary | ICD-10-CM | POA: Diagnosis not present

## 2014-08-04 ENCOUNTER — Other Ambulatory Visit: Payer: Self-pay

## 2014-08-05 ENCOUNTER — Encounter: Payer: Self-pay | Admitting: Radiation Oncology

## 2014-08-06 ENCOUNTER — Telehealth: Payer: Self-pay | Admitting: *Deleted

## 2014-08-06 NOTE — Telephone Encounter (Signed)
Called patient to remind of appts. For 08-07-14, spoke with patient and he is aware of these appts.

## 2014-08-07 ENCOUNTER — Ambulatory Visit
Admission: RE | Admit: 2014-08-07 | Discharge: 2014-08-07 | Disposition: A | Discharge status: Home / Self Care | Payer: Commercial Managed Care - HMO | Source: Ambulatory Visit | Attending: Radiation Oncology | Admitting: Radiation Oncology

## 2014-08-07 VITALS — BP 124/69 | HR 77 | Temp 98.7°F | Ht 71.0 in | Wt 192.6 lb

## 2014-08-07 DIAGNOSIS — C61 Malignant neoplasm of prostate: Secondary | ICD-10-CM

## 2014-08-07 HISTORY — DX: Personal history of irradiation: Z92.3

## 2014-08-07 NOTE — Progress Notes (Signed)
Complex simulation note: The patient was taken to the CT simulator.  His pelvis was scanned.  The CT data set was sent to the planning system where contoured his prostate and rectum.  He is now ready for post implant dosimetry to assess the quality of his implant.

## 2014-08-07 NOTE — Progress Notes (Signed)
CC: Dr. Lowella Bandy, Dr. Cathlean Cower  Follow-up note:  History: Mr. Lowenthal returns today almost 4 weeks following his prostate seed implant with Dr. Janice Norrie in the management of his intermediate risk adenocarcinoma prostate.  Following his implant he went into urinary retention and had a catheter for a week.  He was started on tamsulosin.  He is now entering his bladder without difficulty although he does have occasional hesitancy.  No GI difficulties.  His CT scan from this afternoon shows what appears to be a satisfactory seed distribution.  Physical examination: Alert and oriented. Filed Vitals:   08/07/14 1434  BP: 124/69  Pulse: 77  Temp: 98.7 F (37.1 C)   Rectal examination not performed today.  Impression: Satisfactory progress although he did have urinary retention following his implant.  Hopefully, his urination will improve.  I did explain to him that the half-life of I-125 seeds approximate 2 months so he may notice more urgency and frequency over the next one to 2 months.  He would like to stop his tamsulosin and I told him that he could discuss this with Dr. Janice Norrie and we will see tomorrow.  Plan: Follow visit with Dr. Janice Norrie tomorrow.  We will move ahead with his post implant dosimetry and forward his results to Dr. Janice Norrie in the near future.  I've not scheduled the patient for a formal follow-up visit and I ask that Dr. Janice Norrie keep me posted on his progress.

## 2014-08-07 NOTE — Progress Notes (Signed)
Samuel Krueger here s/p prostate deed implant completed on 07/15/14.  He reported that after his catheter was removed he had urinary retention and had re-insertion of catheter for 1 week.  After 2nd removal he was able to void.  He reports that he is emptying his bladder witout difficutly, but at times his stream will stop, and he has to wait a "few" minutes for his stream to restart.  However, he states flow is getting better.  He had an episode of ejaculation without any semen and he read that this can happen with the use of Flomax, therefore he wants to stop taking this med at this time.  Denies any diarrhea at this time, nor rectal irritation.  Good appetite. "I feel a little fatigue at this time."

## 2014-08-08 DIAGNOSIS — B961 Klebsiella pneumoniae [K. pneumoniae] as the cause of diseases classified elsewhere: Secondary | ICD-10-CM | POA: Diagnosis not present

## 2014-08-08 DIAGNOSIS — N390 Urinary tract infection, site not specified: Secondary | ICD-10-CM | POA: Diagnosis not present

## 2014-08-27 ENCOUNTER — Encounter: Payer: Self-pay | Admitting: Radiation Oncology

## 2014-08-27 DIAGNOSIS — C61 Malignant neoplasm of prostate: Secondary | ICD-10-CM | POA: Diagnosis not present

## 2014-08-27 NOTE — Progress Notes (Signed)
CC: Dr. Lowella Bandy, Dr. Cathlean Cower  Post implant CT dosimetry note:  The patient completed post implant dosimetry to assess the quality of his prostate seed implant.  His intraoperative prostate volume by ultrasound was 71.9 mL and his postoperative prostate volume by CT was 69.9, a good correlation.  Dose volume histograms were obtained to assess the quality of his implant.  His prostate D 90 is 97.7%, and V100  88.3%, both excellent.  Only 0.01 mL of rectum received the prescribed dose of 14,500 cGy.  In summary, the patient appears to have excellent post implant dosimetry with a low risk for late rectal toxicity.

## 2014-10-28 DIAGNOSIS — C61 Malignant neoplasm of prostate: Secondary | ICD-10-CM | POA: Diagnosis not present

## 2014-12-22 ENCOUNTER — Emergency Department (HOSPITAL_COMMUNITY)
Admission: EM | Admit: 2014-12-22 | Discharge: 2014-12-22 | Disposition: A | Discharge status: Home / Self Care | Payer: Commercial Managed Care - HMO | Attending: Emergency Medicine | Admitting: Emergency Medicine

## 2014-12-22 ENCOUNTER — Emergency Department (HOSPITAL_COMMUNITY): Payer: Commercial Managed Care - HMO

## 2014-12-22 ENCOUNTER — Encounter (HOSPITAL_COMMUNITY): Payer: Self-pay | Admitting: Emergency Medicine

## 2014-12-22 DIAGNOSIS — Z8546 Personal history of malignant neoplasm of prostate: Secondary | ICD-10-CM | POA: Diagnosis not present

## 2014-12-22 DIAGNOSIS — M064 Inflammatory polyarthropathy: Secondary | ICD-10-CM | POA: Diagnosis not present

## 2014-12-22 DIAGNOSIS — Y9289 Other specified places as the place of occurrence of the external cause: Secondary | ICD-10-CM | POA: Insufficient documentation

## 2014-12-22 DIAGNOSIS — M13842 Other specified arthritis, left hand: Secondary | ICD-10-CM | POA: Diagnosis not present

## 2014-12-22 DIAGNOSIS — Z7982 Long term (current) use of aspirin: Secondary | ICD-10-CM | POA: Insufficient documentation

## 2014-12-22 DIAGNOSIS — Z79899 Other long term (current) drug therapy: Secondary | ICD-10-CM | POA: Insufficient documentation

## 2014-12-22 DIAGNOSIS — Y9389 Activity, other specified: Secondary | ICD-10-CM | POA: Insufficient documentation

## 2014-12-22 DIAGNOSIS — R2232 Localized swelling, mass and lump, left upper limb: Secondary | ICD-10-CM | POA: Diagnosis not present

## 2014-12-22 DIAGNOSIS — Z87891 Personal history of nicotine dependence: Secondary | ICD-10-CM | POA: Diagnosis not present

## 2014-12-22 DIAGNOSIS — X58XXXA Exposure to other specified factors, initial encounter: Secondary | ICD-10-CM | POA: Insufficient documentation

## 2014-12-22 DIAGNOSIS — M7989 Other specified soft tissue disorders: Secondary | ICD-10-CM | POA: Diagnosis not present

## 2014-12-22 DIAGNOSIS — Z8669 Personal history of other diseases of the nervous system and sense organs: Secondary | ICD-10-CM | POA: Diagnosis not present

## 2014-12-22 DIAGNOSIS — E119 Type 2 diabetes mellitus without complications: Secondary | ICD-10-CM | POA: Diagnosis not present

## 2014-12-22 DIAGNOSIS — S6992XA Unspecified injury of left wrist, hand and finger(s), initial encounter: Secondary | ICD-10-CM | POA: Diagnosis not present

## 2014-12-22 DIAGNOSIS — M79642 Pain in left hand: Secondary | ICD-10-CM | POA: Diagnosis not present

## 2014-12-22 DIAGNOSIS — M1990 Unspecified osteoarthritis, unspecified site: Secondary | ICD-10-CM

## 2014-12-22 DIAGNOSIS — Y998 Other external cause status: Secondary | ICD-10-CM | POA: Diagnosis not present

## 2014-12-22 DIAGNOSIS — M25532 Pain in left wrist: Secondary | ICD-10-CM | POA: Diagnosis not present

## 2014-12-22 LAB — BASIC METABOLIC PANEL
ANION GAP: 10 (ref 5–15)
BUN: 16 mg/dL (ref 6–20)
CO2: 24 mmol/L (ref 22–32)
Calcium: 9.6 mg/dL (ref 8.9–10.3)
Chloride: 102 mmol/L (ref 101–111)
Creatinine, Ser: 1.03 mg/dL (ref 0.61–1.24)
GFR calc Af Amer: >60 mL/min (ref >60)
Glucose, Bld: 139 mg/dL — ABNORMAL HIGH (ref 65–99)
Potassium: 4.1 mmol/L (ref 3.5–5.1)
SODIUM: 136 mmol/L (ref 135–145)

## 2014-12-22 LAB — CBC WITH DIFFERENTIAL/PLATELET
BASOS PCT: 0 %
Basophils Absolute: 0 10*3/uL (ref 0.0–0.1)
Eosinophils Absolute: 0.1 10*3/uL (ref 0.0–0.7)
Eosinophils Relative: 1 %
HCT: 44.6 % (ref 39.0–52.0)
HEMOGLOBIN: 14.6 g/dL (ref 13.0–17.0)
LYMPHS PCT: 8 %
Lymphs Abs: 0.8 10*3/uL (ref 0.7–4.0)
MCH: 26.6 pg (ref 26.0–34.0)
MCHC: 32.7 g/dL (ref 30.0–36.0)
MCV: 81.4 fL (ref 78.0–100.0)
MONOS PCT: 7 %
Monocytes Absolute: 0.7 10*3/uL (ref 0.1–1.0)
NEUTROS ABS: 8.8 10*3/uL — AB (ref 1.7–7.7)
Neutrophils Relative %: 84 %
PLATELETS: 175 10*3/uL (ref 150–400)
RBC: 5.48 MIL/uL (ref 4.22–5.81)
RDW: 13.3 % (ref 11.5–15.5)
WBC: 10.4 10*3/uL (ref 4.0–10.5)

## 2014-12-22 LAB — URIC ACID: URIC ACID, SERUM: 5 mg/dL (ref 4.4–7.6)

## 2014-12-22 MED ORDER — CEFAZOLIN SODIUM 1-5 GM-% IV SOLN
1.0000 g | Freq: Once | INTRAVENOUS | Status: AC
  Administered 2014-12-22: 1 g via INTRAVENOUS
  Filled 2014-12-22: qty 50

## 2014-12-22 MED ORDER — PREDNISONE 20 MG PO TABS
40.0000 mg | ORAL_TABLET | Freq: Once | ORAL | Status: AC
  Administered 2014-12-22: 40 mg via ORAL
  Filled 2014-12-22: qty 2

## 2014-12-22 MED ORDER — TRAMADOL HCL 50 MG PO TABS
50.0000 mg | ORAL_TABLET | Freq: Once | ORAL | Status: AC
  Administered 2014-12-22: 50 mg via ORAL
  Filled 2014-12-22: qty 1

## 2014-12-22 MED ORDER — SODIUM CHLORIDE 0.9 % IV SOLN
INTRAVENOUS | Status: DC
  Administered 2014-12-22: 13:00:00 via INTRAVENOUS

## 2014-12-22 MED ORDER — CEPHALEXIN 500 MG PO CAPS: 500.0000 mg | ORAL_CAPSULE | Freq: Four times a day (QID) | ORAL | Status: DC
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
## 2014-12-22 NOTE — ED Notes (Signed)
MD at bedside. 

## 2014-12-22 NOTE — Discharge Instructions (Signed)
It was our pleasure to provide your ER care today - we hope that you feel better.  Keep your hand elevated to help with the swelling.  Take keflex (antibiotic) as prescribed.  Take motrin or aleve as need for pain/inflammation.  Follow up with your primary care doctor in the next 1-2 days for recheck - call office today, or in AM tomorrow, to arrange appointment.  Return to ER right away if symptoms are getting worse, spreading redness, increased swelling, severe pain, fevers, other concern.
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
## 2014-12-27 LAB — CULTURE, BLOOD (ROUTINE X 2)
Culture: NO GROWTH
Culture: NO GROWTH

## 2015-01-21 DIAGNOSIS — C61 Malignant neoplasm of prostate: Secondary | ICD-10-CM | POA: Diagnosis not present

## 2015-02-27 ENCOUNTER — Telehealth: Payer: Self-pay | Admitting: Internal Medicine

## 2015-02-27 NOTE — Telephone Encounter (Signed)
Please disregard my last request on the grounds of the following....  This patient was last seen on September 2013. At this point, are you willing to accept him back as a patient? If so, do we need to do an OV before any referral would be done? My apologies for not paying attention to begin with

## 2015-02-27 NOTE — Telephone Encounter (Signed)
Patient states that dr Acie Fredrickson @ [facility] has retired, and he needs a new referral for dr Enid Derry.

## 2015-02-27 NOTE — Telephone Encounter (Signed)
Pt will need an OV, thanks

## 2015-03-18 NOTE — Telephone Encounter (Signed)
Ok with me, though I do not treat chronic pain so I would not be able to provide chronic narcotic medications if he is now being treated in this way

## 2015-03-18 NOTE — Telephone Encounter (Signed)
I just wanted to get approval from you if he can re establish with you.  Please advise

## 2015-03-19 NOTE — Telephone Encounter (Signed)
appt made

## 2015-03-26 ENCOUNTER — Encounter: Payer: Self-pay | Admitting: Internal Medicine

## 2015-03-26 ENCOUNTER — Ambulatory Visit (INDEPENDENT_AMBULATORY_CARE_PROVIDER_SITE_OTHER): Payer: Commercial Managed Care - HMO | Admitting: Internal Medicine

## 2015-03-26 ENCOUNTER — Other Ambulatory Visit (INDEPENDENT_AMBULATORY_CARE_PROVIDER_SITE_OTHER): Payer: Commercial Managed Care - HMO

## 2015-03-26 VITALS — BP 140/84 | HR 65 | Temp 98.6°F | Resp 20 | Wt 195.0 lb

## 2015-03-26 DIAGNOSIS — Z23 Encounter for immunization: Secondary | ICD-10-CM | POA: Diagnosis not present

## 2015-03-26 DIAGNOSIS — E119 Type 2 diabetes mellitus without complications: Secondary | ICD-10-CM | POA: Diagnosis not present

## 2015-03-26 DIAGNOSIS — Z0000 Encounter for general adult medical examination without abnormal findings: Secondary | ICD-10-CM | POA: Diagnosis not present

## 2015-03-26 DIAGNOSIS — R030 Elevated blood-pressure reading, without diagnosis of hypertension: Secondary | ICD-10-CM

## 2015-03-26 DIAGNOSIS — Z8546 Personal history of malignant neoplasm of prostate: Secondary | ICD-10-CM | POA: Insufficient documentation

## 2015-03-26 DIAGNOSIS — C61 Malignant neoplasm of prostate: Secondary | ICD-10-CM

## 2015-03-26 DIAGNOSIS — F1021 Alcohol dependence, in remission: Secondary | ICD-10-CM | POA: Insufficient documentation

## 2015-03-26 LAB — URINALYSIS, ROUTINE W REFLEX MICROSCOPIC
BILIRUBIN URINE: NEGATIVE
HGB URINE DIPSTICK: NEGATIVE
KETONES UR: NEGATIVE
LEUKOCYTES UA: NEGATIVE
NITRITE: NEGATIVE
Specific Gravity, Urine: 1.02 (ref 1.000–1.030)
Total Protein, Urine: NEGATIVE
UROBILINOGEN UA: 0.2 (ref 0.0–1.0)
Urine Glucose: NEGATIVE
pH: 6 (ref 5.0–8.0)

## 2015-03-26 LAB — CBC WITH DIFFERENTIAL/PLATELET
BASOS ABS: 0 10*3/uL (ref 0.0–0.1)
Basophils Relative: 0.4 % (ref 0.0–3.0)
Eosinophils Absolute: 0.1 10*3/uL (ref 0.0–0.7)
Eosinophils Relative: 1.8 % (ref 0.0–5.0)
HCT: 41.8 % (ref 39.0–52.0)
HEMOGLOBIN: 13.9 g/dL (ref 13.0–17.0)
LYMPHS ABS: 0.8 10*3/uL (ref 0.7–4.0)
Lymphocytes Relative: 12.8 % (ref 12.0–46.0)
MCHC: 33.2 g/dL (ref 30.0–36.0)
MCV: 79.2 fl (ref 78.0–100.0)
MONO ABS: 0.4 10*3/uL (ref 0.1–1.0)
MONOS PCT: 5.9 % (ref 3.0–12.0)
NEUTROS PCT: 79.1 % — AB (ref 43.0–77.0)
Neutro Abs: 4.9 10*3/uL (ref 1.4–7.7)
Platelets: 183 10*3/uL (ref 150.0–400.0)
RBC: 5.28 Mil/uL (ref 4.22–5.81)
RDW: 14 % (ref 11.5–15.5)
WBC: 6.3 10*3/uL (ref 4.0–10.5)

## 2015-03-26 LAB — HEPATIC FUNCTION PANEL
ALBUMIN: 4.3 g/dL (ref 3.5–5.2)
ALK PHOS: 45 U/L (ref 39–117)
ALT: 8 U/L (ref 0–53)
AST: 16 U/L (ref 0–37)
Bilirubin, Direct: 0.1 mg/dL (ref 0.0–0.3)
TOTAL PROTEIN: 7.3 g/dL (ref 6.0–8.3)
Total Bilirubin: 0.4 mg/dL (ref 0.2–1.2)

## 2015-03-26 LAB — TSH: TSH: 0.49 u[IU]/mL (ref 0.35–4.50)

## 2015-03-26 LAB — BASIC METABOLIC PANEL
BUN: 16 mg/dL (ref 6–23)
CALCIUM: 9.5 mg/dL (ref 8.4–10.5)
CO2: 32 meq/L (ref 19–32)
Chloride: 102 mEq/L (ref 96–112)
Creatinine, Ser: 0.98 mg/dL (ref 0.40–1.50)
GFR: 97.07 mL/min (ref >60.00)
GLUCOSE: 126 mg/dL — AB (ref 70–99)
Potassium: 4.3 mEq/L (ref 3.5–5.1)
SODIUM: 139 meq/L (ref 135–145)

## 2015-03-26 LAB — LIPID PANEL
CHOLESTEROL: 171 mg/dL (ref 0–200)
HDL: 52.4 mg/dL (ref >39.00)
LDL CALC: 106 mg/dL — AB (ref 0–99)
NonHDL: 118.83
TRIGLYCERIDES: 64 mg/dL (ref 0.0–149.0)
Total CHOL/HDL Ratio: 3
VLDL: 12.8 mg/dL (ref 0.0–40.0)

## 2015-03-26 LAB — MICROALBUMIN / CREATININE URINE RATIO
Creatinine,U: 136.5 mg/dL
MICROALB UR: 0.8 mg/dL (ref 0.0–1.9)
Microalb Creat Ratio: 0.6 mg/g (ref 0.0–30.0)

## 2015-03-26 LAB — HEMOGLOBIN A1C: HEMOGLOBIN A1C: 7.6 % — AB (ref 4.6–6.5)

## 2015-03-26 LAB — PSA: PSA: 2.31 ng/mL (ref 0.10–4.00)

## 2015-03-26 NOTE — Progress Notes (Signed)
Subjective:    Patient ID: Samuel Krueger, male    DOB: 1945-04-15, 70 y.o.   MRN: [id number]  HPI  Here for wellness and re-establish as new pt; last seen 2013;  Overall doing ok;  Pt denies Chest pain, worsening SOB, DOE, wheezing, orthopnea, PND, worsening LE edema, palpitations, dizziness or syncope.  Pt denies neurological change such as new headache, facial or extremity weakness.  Pt denies polydipsia, polyuria, or low sugar symptoms. Pt states overall good compliance with treatment and medications, good tolerability, and has been trying to follow appropriate diet.  Pt denies worsening depressive symptoms, suicidal ideation or panic. No fever, night sweats, wt loss, loss of appetite, or other constitutional symptoms.  Pt states good ability with ADL's, has low fall risk, home safety reviewed and adequate, no other significant changes in hearing or vision, and only occasionally active with exercise.  Asks for Podiatry referral with hx of DM, and Needs referral for urology f/u as prior MD Dr Janice Norrie retired. Declines flu shot and pna shot, will take tetanus.  Past Medical History  Diagnosis Date  . Recovering alcoholic in remission ([place])     since rehab 20 yrs ago-- approx 1995  . Prostate cancer [facility]) urologist-- dr Janice Norrie    active survellance since dx Feb 2014--  now has Stage T1c, Gleason (3+4)7, PSA 7.75  . Tinnitus of both ears   . Type 2 diabetes, diet controlled ([place])   . Nocturia   . S/P radiation therapy 07/15/14    Radioactive Seed Implant- Prostate/proximal seminal vesicles, 14,500 cGy utilizing 74 I-125 seeds implanted in 30 needles with an individual seed activity 0.658 mCi per seed for a total implant activity of 48.6.millicuries.   Past Surgical History  Procedure Laterality Date  . Prostate biopsy  03/26/14  . Radioactive seed implant N/A 07/15/2014    Procedure: RADIOACTIVE SEED IMPLANT/BRACHYTHERAPY IMPLANT;  Surgeon: Lowella Bandy, MD;  Location: [facility];   Service: Urology;  Laterality: N/A;    reports that he quit smoking about 14 years ago. His smoking use included Cigarettes. He quit after .8 years of use. He has never used smokeless tobacco. He reports that he does not drink alcohol or use illicit drugs. family history includes Alcohol abuse in his mother; HIV/AIDS in his brother; Heart disease in his mother; Hypertension in his mother; Mental illness in his other. Allergies  Allergen Reactions  . Metformin And Related Other (See Comments)    Chest tightness  . Shrimp [Shellfish Allergy] Other (See Comments)    "strange GI reaction"   No current outpatient prescriptions on file prior to visit.   No current facility-administered medications on file prior to visit.    Review of Systems Constitutional: Negative for increased diaphoresis, other activity, appetite or siginficant weight change other than noted HENT: Negative for worsening hearing loss, ear pain, facial swelling, mouth sores and neck stiffness.   Eyes: Negative for other worsening pain, redness or visual disturbance.  Respiratory: Negative for shortness of breath and wheezing  Cardiovascular: Negative for chest pain and palpitations.  Gastrointestinal: Negative for diarrhea, blood in stool, abdominal distention or other pain Genitourinary: Negative for hematuria, flank pain or change in urine volume.  Musculoskeletal: Negative for myalgias or other joint complaints.  Skin: Negative for color change and wound or drainage.  Neurological: Negative for syncope and numbness. other than noted Hematological: Negative for adenopathy. or other swelling Psychiatric/Behavioral: Negative for hallucinations, SI, self-injury, decreased concentration or other worsening agitation.  Objective:   Physical Exam BP 140/84 mmHg  Pulse 65  Temp(Src) 98.6 F (37 C) (Oral)  Resp 20  Wt 195 lb (88.451 kg)  SpO2 97% VS noted,  Constitutional: Pt is oriented to person, place, and time.  Appears well-developed and well-nourished, in no significant distress Head: Normocephalic and atraumatic.  Right Ear: External ear normal.  Left Ear: External ear normal.  Nose: Nose normal.  Mouth/Throat: Oropharynx is clear and moist.  Eyes: Conjunctivae and EOM are normal. Pupils are equal, round, and reactive to light.  Neck: Normal range of motion. Neck supple. No JVD present. No tracheal deviation present or significant neck LA or mass Cardiovascular: Normal rate, regular rhythm, normal heart sounds and intact distal pulses.   Pulmonary/Chest: Effort normal and breath sounds without rales or wheezing  Abdominal: Soft. Bowel sounds are normal. NT. No HSM  Musculoskeletal: Normal range of motion. Exhibits no edema.  Lymphadenopathy:  Has no cervical adenopathy.  Neurological: Pt is alert and oriented to person, place, and time. Pt has normal reflexes. No cranial nerve deficit. Motor grossly intact Skin: Skin is warm and dry. No rash noted.  Psychiatric:  Has normal mood and affect. Behavior is normal.     Assessment & Plan:

## 2015-03-26 NOTE — Progress Notes (Signed)
Pre visit review using our clinic review tool, if applicable. No additional management support is needed unless otherwise documented below in the visit note. 

## 2015-03-26 NOTE — Patient Instructions (Addendum)
Please continue all other medications as before, and refills have been done if requested.  Please have the pharmacy call with any other refills you may need.  Please continue your efforts at being more active, low cholesterol diet, and weight control.  You are otherwise up to date with prevention measures today.  Please keep your appointments with your specialists as you may have planned  You will be contacted regarding the referral for: urology, podiatry and colonoscopy  Please go to the LAB in the Basement (turn left off the elevator) for the tests to be done today  You will be contacted by phone if any changes need to be made immediately.  Otherwise, you will receive a letter about your results with an explanation, but please check with MyChart first.  Please remember to sign up for MyChart if you have not done so, as this will be important to you in the future with finding out test results, communicating by private email, and scheduling acute appointments online when needed.  Please return in 6 months, or sooner if needed, with Lab testing done 3-5 days before

## 2015-03-27 ENCOUNTER — Telehealth: Payer: Self-pay | Admitting: Internal Medicine

## 2015-03-27 LAB — HEPATITIS C ANTIBODY: HCV AB: NEGATIVE

## 2015-03-27 MED ORDER — ASPIRIN EC 81 MG PO TBEC: 81.0000 mg | DELAYED_RELEASE_TABLET | Freq: Every day | ORAL | Status: DC

## 2015-03-27 MED ORDER — GLIPIZIDE ER 2.5 MG PO TB24: 2.5000 mg | ORAL_TABLET | Freq: Every day | ORAL | Status: DC

## 2015-03-27 NOTE — Telephone Encounter (Signed)
Corinne to disregard note about metformin, as pt is allergic  To let pt know -   OK to Not take the metformin, but to take Glipizide ER 2.5 mg daily  Also, please start ASA 81 mg - 1 per day, to help reduce risk of heart disease and stroke

## 2015-03-28 NOTE — Assessment & Plan Note (Signed)
stable overall by history and exam, recent data reviewed with pt, and pt to continue medical treatment as before,  to f/u any worsening symptoms or concerns Lab Results  Component Value Date   HGBA1C 7.6* 03/26/2015

## 2015-03-28 NOTE — Assessment & Plan Note (Signed)

## 2015-03-28 NOTE — Assessment & Plan Note (Signed)
stable overall by history and exam, recent data reviewed with pt, and pt to continue medical treatment as before,  to f/u any worsening symptoms or concerns BP Readings from Last 3 Encounters:  03/26/15 140/84  12/22/14 128/83  08/07/14 124/69

## 2015-03-28 NOTE — Assessment & Plan Note (Addendum)
Also for psa as he is due,  to f/u any worsening symptoms or concerns, for urology referral

## 2015-04-15 ENCOUNTER — Encounter: Payer: Self-pay | Admitting: Internal Medicine

## 2015-04-29 ENCOUNTER — Encounter: Payer: Self-pay | Admitting: Internal Medicine

## 2015-05-06 ENCOUNTER — Encounter: Payer: Self-pay | Admitting: Podiatry

## 2015-05-06 ENCOUNTER — Ambulatory Visit (INDEPENDENT_AMBULATORY_CARE_PROVIDER_SITE_OTHER): Payer: Commercial Managed Care - HMO | Admitting: Podiatry

## 2015-05-06 VITALS — BP 122/88 | HR 77 | Resp 12

## 2015-05-06 DIAGNOSIS — M79675 Pain in left toe(s): Secondary | ICD-10-CM | POA: Diagnosis not present

## 2015-05-06 DIAGNOSIS — E0842 Diabetes mellitus due to underlying condition with diabetic polyneuropathy: Secondary | ICD-10-CM | POA: Diagnosis not present

## 2015-05-06 DIAGNOSIS — B351 Tinea unguium: Secondary | ICD-10-CM | POA: Diagnosis not present

## 2015-05-06 DIAGNOSIS — M79674 Pain in right toe(s): Secondary | ICD-10-CM | POA: Diagnosis not present

## 2015-05-06 NOTE — Patient Instructions (Signed)
Diabetes and Foot Care Diabetes may cause you to have problems because of poor blood supply (circulation) to your feet and legs. This may cause the skin on your feet to become thinner, break easier, and heal more slowly. Your skin may become dry, and the skin may peel and crack. You may also have nerve damage in your legs and feet causing decreased feeling in them. You may not notice minor injuries to your feet that could lead to infections or more serious problems. Taking care of your feet is one of the most important things you can do for yourself.  HOME CARE INSTRUCTIONS  Wear shoes at all times, even in the house. Do not go barefoot. Bare feet are easily injured.  Check your feet daily for blisters, cuts, and redness. If you cannot see the bottom of your feet, use a mirror or ask someone for help.  Wash your feet with warm water (do not use hot water) and mild soap. Then pat your feet and the areas between your toes until they are completely dry. Do not soak your feet as this can dry your skin.  Apply a moisturizing lotion or petroleum jelly (that does not contain alcohol and is unscented) to the skin on your feet and to dry, brittle toenails. Do not apply lotion between your toes.  Trim your toenails straight across. Do not dig under them or around the cuticle. File the edges of your nails with an emery board or nail file.  Do not cut corns or calluses or try to remove them with medicine.  Wear clean socks or stockings every day. Make sure they are not too tight. Do not wear knee-high stockings since they may decrease blood flow to your legs.  Wear shoes that fit properly and have enough cushioning. To break in new shoes, wear them for just a few hours a day. This prevents you from injuring your feet. Always look in your shoes before you put them on to be sure there are no objects inside.  Do not cross your legs. This may decrease the blood flow to your feet.  If you find a minor scrape,  cut, or break in the skin on your feet, keep it and the skin around it clean and dry. These areas may be cleansed with mild soap and water. Do not cleanse the area with peroxide, alcohol, or iodine.  When you remove an adhesive bandage, be sure not to damage the skin around it.  If you have a wound, look at it several times a day to make sure it is healing.  Do not use heating pads or hot water bottles. They may burn your skin. If you have lost feeling in your feet or legs, you may not know it is happening until it is too late.  Make sure your health care provider performs a complete foot exam at least annually or more often if you have foot problems. Report any cuts, sores, or bruises to your health care provider immediately. SEEK MEDICAL CARE IF:   You have an injury that is not healing.  You have cuts or breaks in the skin.  You have an ingrown nail.  You notice redness on your legs or feet.  You feel burning or tingling in your legs or feet.  You have pain or cramps in your legs and feet.  Your legs or feet are numb.  Your feet always feel cold. SEEK IMMEDIATE MEDICAL CARE IF:   There is increasing redness,   swelling, or pain in or around a wound.  There is a red line that goes up your leg.  Pus is coming from a wound.  You develop a fever or as directed by your health care provider.  You notice a bad smell coming from an ulcer or wound.   This information is not intended to replace advice given to you by your health care provider. Make sure you discuss any questions you have with your health care provider.   Document Released: 01/22/2000 Document Revised: 09/26/2012 Document Reviewed: 07/03/2012 Elsevier Interactive Patient Education 2016 [employer]  

## 2015-05-06 NOTE — Progress Notes (Signed)
   Subjective:    Patient ID: Samuel Krueger, male    DOB: [birth year], 70 y.o.   MRN: [id number]  HPI N  This patient presents today with a multiple year history of thickened and elongated toenails that have become progressively thicker and longer over time. Patient has attempted to self trim the nails, however, is unable to trim the nails at this time because of extreme thickness and deformity in the toenails and is requesting toenail debridement today.  Patient is a diabetic and denies any history of foot ulcerations, claudication or amputation  Review of Systems  Genitourinary: Positive for frequency.  Skin: Positive for color change.       Objective:   Physical Exam  Orientated 3  Vascular: No peripheral edema noted bilaterally DP pulses 2/4 bilaterally PT pulses 2/4 bilaterally Capillary reflex immediate bilaterally  Neurological: Sensation to 10 g monofilament wire intact 3/5 right 2/5 left Vibratory sensation reactive right nonreactive left Ankle reflexes equal and reactive bilaterally  Dermatological: No open skin lesions bilaterally The toenails are extremely hypertrophic, deformed, discolored up to 1/2-1 inch thick 10  Musculoskeletal: No deformities noted bilaterally No restrictions or crepitus upon range of motion of ankle, subtalar, midtarsal joints bilaterally      Assessment & Plan:   Assessment: Satisfactory vascular status Peripheral neuropathy that may be associated with diabetes or history of alcoholism Symptomatic onychomycoses 6-10 Type II diabetic  Plan: Debridement of toenails 6-10 mechanically and electrically without any bleeding  Reappoint 4 months for nail debridement or sooner if patient has concern

## 2015-06-18 DIAGNOSIS — C61 Malignant neoplasm of prostate: Secondary | ICD-10-CM | POA: Diagnosis not present

## 2015-06-18 DIAGNOSIS — Z0000 Encounter for general adult medical examination without abnormal findings: Secondary | ICD-10-CM | POA: Diagnosis not present

## 2015-09-02 ENCOUNTER — Ambulatory Visit: Payer: Commercial Managed Care - HMO | Admitting: Podiatry

## 2015-09-23 ENCOUNTER — Other Ambulatory Visit (INDEPENDENT_AMBULATORY_CARE_PROVIDER_SITE_OTHER): Payer: Commercial Managed Care - HMO

## 2015-09-23 ENCOUNTER — Ambulatory Visit (INDEPENDENT_AMBULATORY_CARE_PROVIDER_SITE_OTHER): Payer: Commercial Managed Care - HMO | Admitting: Internal Medicine

## 2015-09-23 ENCOUNTER — Encounter: Payer: Self-pay | Admitting: Internal Medicine

## 2015-09-23 VITALS — BP 140/76 | HR 73 | Temp 98.0°F | Resp 20 | Wt 193.0 lb

## 2015-09-23 DIAGNOSIS — Z0001 Encounter for general adult medical examination with abnormal findings: Secondary | ICD-10-CM

## 2015-09-23 DIAGNOSIS — E119 Type 2 diabetes mellitus without complications: Secondary | ICD-10-CM

## 2015-09-23 DIAGNOSIS — Z7289 Other problems related to lifestyle: Secondary | ICD-10-CM

## 2015-09-23 DIAGNOSIS — R6889 Other general symptoms and signs: Secondary | ICD-10-CM

## 2015-09-23 DIAGNOSIS — R030 Elevated blood-pressure reading, without diagnosis of hypertension: Secondary | ICD-10-CM | POA: Diagnosis not present

## 2015-09-23 DIAGNOSIS — Z789 Other specified health status: Secondary | ICD-10-CM | POA: Diagnosis not present

## 2015-09-23 LAB — BASIC METABOLIC PANEL
BUN: 15 mg/dL (ref 6–23)
CHLORIDE: 103 meq/L (ref 96–112)
CO2: 30 mEq/L (ref 19–32)
CREATININE: 1.05 mg/dL (ref 0.40–1.50)
Calcium: 9.7 mg/dL (ref 8.4–10.5)
GFR: 89.51 mL/min (ref >60.00)
GLUCOSE: 135 mg/dL — AB (ref 70–99)
Potassium: 4.3 mEq/L (ref 3.5–5.1)
Sodium: 138 mEq/L (ref 135–145)

## 2015-09-23 LAB — HEPATIC FUNCTION PANEL
ALBUMIN: 4.4 g/dL (ref 3.5–5.2)
ALT: 9 U/L (ref 0–53)
AST: 18 U/L (ref 0–37)
Alkaline Phosphatase: 45 U/L (ref 39–117)
BILIRUBIN TOTAL: 0.3 mg/dL (ref 0.2–1.2)
Bilirubin, Direct: 0.1 mg/dL (ref 0.0–0.3)
TOTAL PROTEIN: 7.5 g/dL (ref 6.0–8.3)

## 2015-09-23 LAB — LIPID PANEL
CHOLESTEROL: 190 mg/dL (ref 0–200)
HDL: 57.6 mg/dL (ref >39.00)
LDL CALC: 115 mg/dL — AB (ref 0–99)
NONHDL: 131.9
Total CHOL/HDL Ratio: 3
Triglycerides: 87 mg/dL (ref 0.0–149.0)
VLDL: 17.4 mg/dL (ref 0.0–40.0)

## 2015-09-23 LAB — HEMOGLOBIN A1C: HEMOGLOBIN A1C: 7.8 % — AB (ref 4.6–6.5)

## 2015-09-23 NOTE — Progress Notes (Signed)
Pre visit review using our clinic review tool, if applicable. No additional management support is needed unless otherwise documented below in the visit note. 

## 2015-09-23 NOTE — Assessment & Plan Note (Signed)
stable overall by history and exam, recent data reviewed with pt, and pt to continue medical treatment as before,  to f/u any worsening symptoms or concerns BP Readings from Last 3 Encounters:  09/23/15 140/76  05/06/15 122/88  03/26/15 140/84   Declines low dose ace today

## 2015-09-23 NOTE — Progress Notes (Signed)
Subjective:    Patient ID: Samuel Krueger, male    DOB: [birth year], 70 y.o.   MRN: [id number]  HPI  Here to f/u; overall doing ok,  Pt denies chest pain, increasing sob or doe, wheezing, orthopnea, PND, increased LE swelling, palpitations, dizziness or syncope.  Pt denies new neurological symptoms such as new headache, or facial or extremity weakness or numbness.  Pt denies polydipsia, polyuria, or low sugar episode.   Pt denies new neurological symptoms such as new headache, or facial or extremity weakness or numbness.   Pt states overall good compliance with meds, mostly trying to follow appropriate diet, with wt overall stable,  but little exercise however.  Not taking the metformin, wanted to try diet and exercise first, back to gym every day x 2wks.  No recent ETOH use, trying to abstain. Past Medical History:  Diagnosis Date  . Nocturia   . Prostate cancer [facility]) urologist-- dr Janice Norrie   active survellance since dx Feb 2014--  now has Stage T1c, Gleason (3+4)7, PSA 7.75  . Recovering alcoholic in remission ([place])    since rehab 20 yrs ago-- approx 1995  . S/P radiation therapy 07/15/14   Radioactive Seed Implant- Prostate/proximal seminal vesicles, 14,500 cGy utilizing 74 I-125 seeds implanted in 30 needles with an individual seed activity 0.658 mCi per seed for a total implant activity of 48.6.millicuries.  . Tinnitus of both ears   . Type 2 diabetes, diet controlled ([place])    Past Surgical History:  Procedure Laterality Date  . PROSTATE BIOPSY  03/26/14  . RADIOACTIVE SEED IMPLANT N/A 07/15/2014   Procedure: RADIOACTIVE SEED IMPLANT/BRACHYTHERAPY IMPLANT;  Surgeon: Lowella Bandy, MD;  Location: [facility];  Service: Urology;  Laterality: N/A;    reports that he quit smoking about 15 years ago. His smoking use included Cigarettes. He quit after 0.80 years of use. He has never used smokeless tobacco. He reports that he does not drink alcohol or use drugs. family history includes  Alcohol abuse in his mother; HIV/AIDS in his brother; Heart disease in his mother; Hypertension in his mother; Mental illness in his other. Allergies  Allergen Reactions  . Metformin And Related Other (See Comments)    Chest tightness  . Shrimp [Shellfish Allergy] Other (See Comments)    "strange GI reaction"   No current outpatient prescriptions on file prior to visit.   No current facility-administered medications on file prior to visit.    Review of Systems  Constitutional: Negative for unusual diaphoresis or night sweats HENT: Negative for ear swelling or discharge Eyes: Negative for worsening visual haziness  Respiratory: Negative for choking and stridor.   Gastrointestinal: Negative for distension or worsening eructation Genitourinary: Negative for retention or change in urine volume.  Musculoskeletal: Negative for other MSK pain or swelling Skin: Negative for color change and worsening wound Neurological: Negative for tremors and numbness other than noted  Psychiatric/Behavioral: Negative for decreased concentration or agitation other than above  '    Objective:   Physical Exam BP 140/76   Pulse 73   Temp 98 F (36.7 C) (Oral)   Resp 20   Wt 193 lb (87.5 kg)   SpO2 96%   BMI 26.92 kg/m  VS noted,  Constitutional: Pt appears in no apparent distress HENT: Head: NCAT.  Right Ear: External ear normal.  Left Ear: External ear normal.  Eyes: . Pupils are equal, round, and reactive to light. Conjunctivae and EOM are normal Neck: Normal range of  motion. Neck supple.  Cardiovascular: Normal rate and regular rhythm.   Pulmonary/Chest: Effort normal and breath sounds without rales or wheezing.  Abd:  Soft, NT, ND, + BS Neurological: Pt is alert. Not confused , motor grossly intact Skin: Skin is warm. No rash, no LE edema Psychiatric: Pt behavior is normal. No agitation.      Assessment & Plan:

## 2015-09-23 NOTE — Assessment & Plan Note (Signed)
None, ok to follow

## 2015-09-23 NOTE — Patient Instructions (Signed)

## 2015-09-23 NOTE — Assessment & Plan Note (Signed)
stable overall by history and exam, recent data reviewed with pt, and pt to continue medical treatment as before,  to f/u any worsening symptoms or concerns Lab Results  Component Value Date   HGBA1C 7.6 (H) 03/26/2015

## 2015-09-25 ENCOUNTER — Other Ambulatory Visit: Payer: Self-pay | Admitting: Internal Medicine

## 2015-09-25 MED ORDER — GLIPIZIDE ER 5 MG PO TB24: 5.0000 mg | ORAL_TABLET | Freq: Every day | ORAL | 3 refills | Status: DC

## 2015-09-25 MED ORDER — ROSUVASTATIN CALCIUM 10 MG PO TABS: 10.0000 mg | ORAL_TABLET | Freq: Every day | ORAL | 3 refills | Status: DC

## 2015-09-25 MED ORDER — ASPIRIN EC 81 MG PO TBEC: 81.0000 mg | DELAYED_RELEASE_TABLET | Freq: Every day | ORAL | 11 refills | Status: DC

## 2015-11-17 IMAGING — CR DG CHEST 2V
2 series · 2 of 2 positions shown · non-contrast
Comparison: None.

CLINICAL DATA: Diabetes.

EXAM:
CHEST  2 VIEW

[w chest pa]
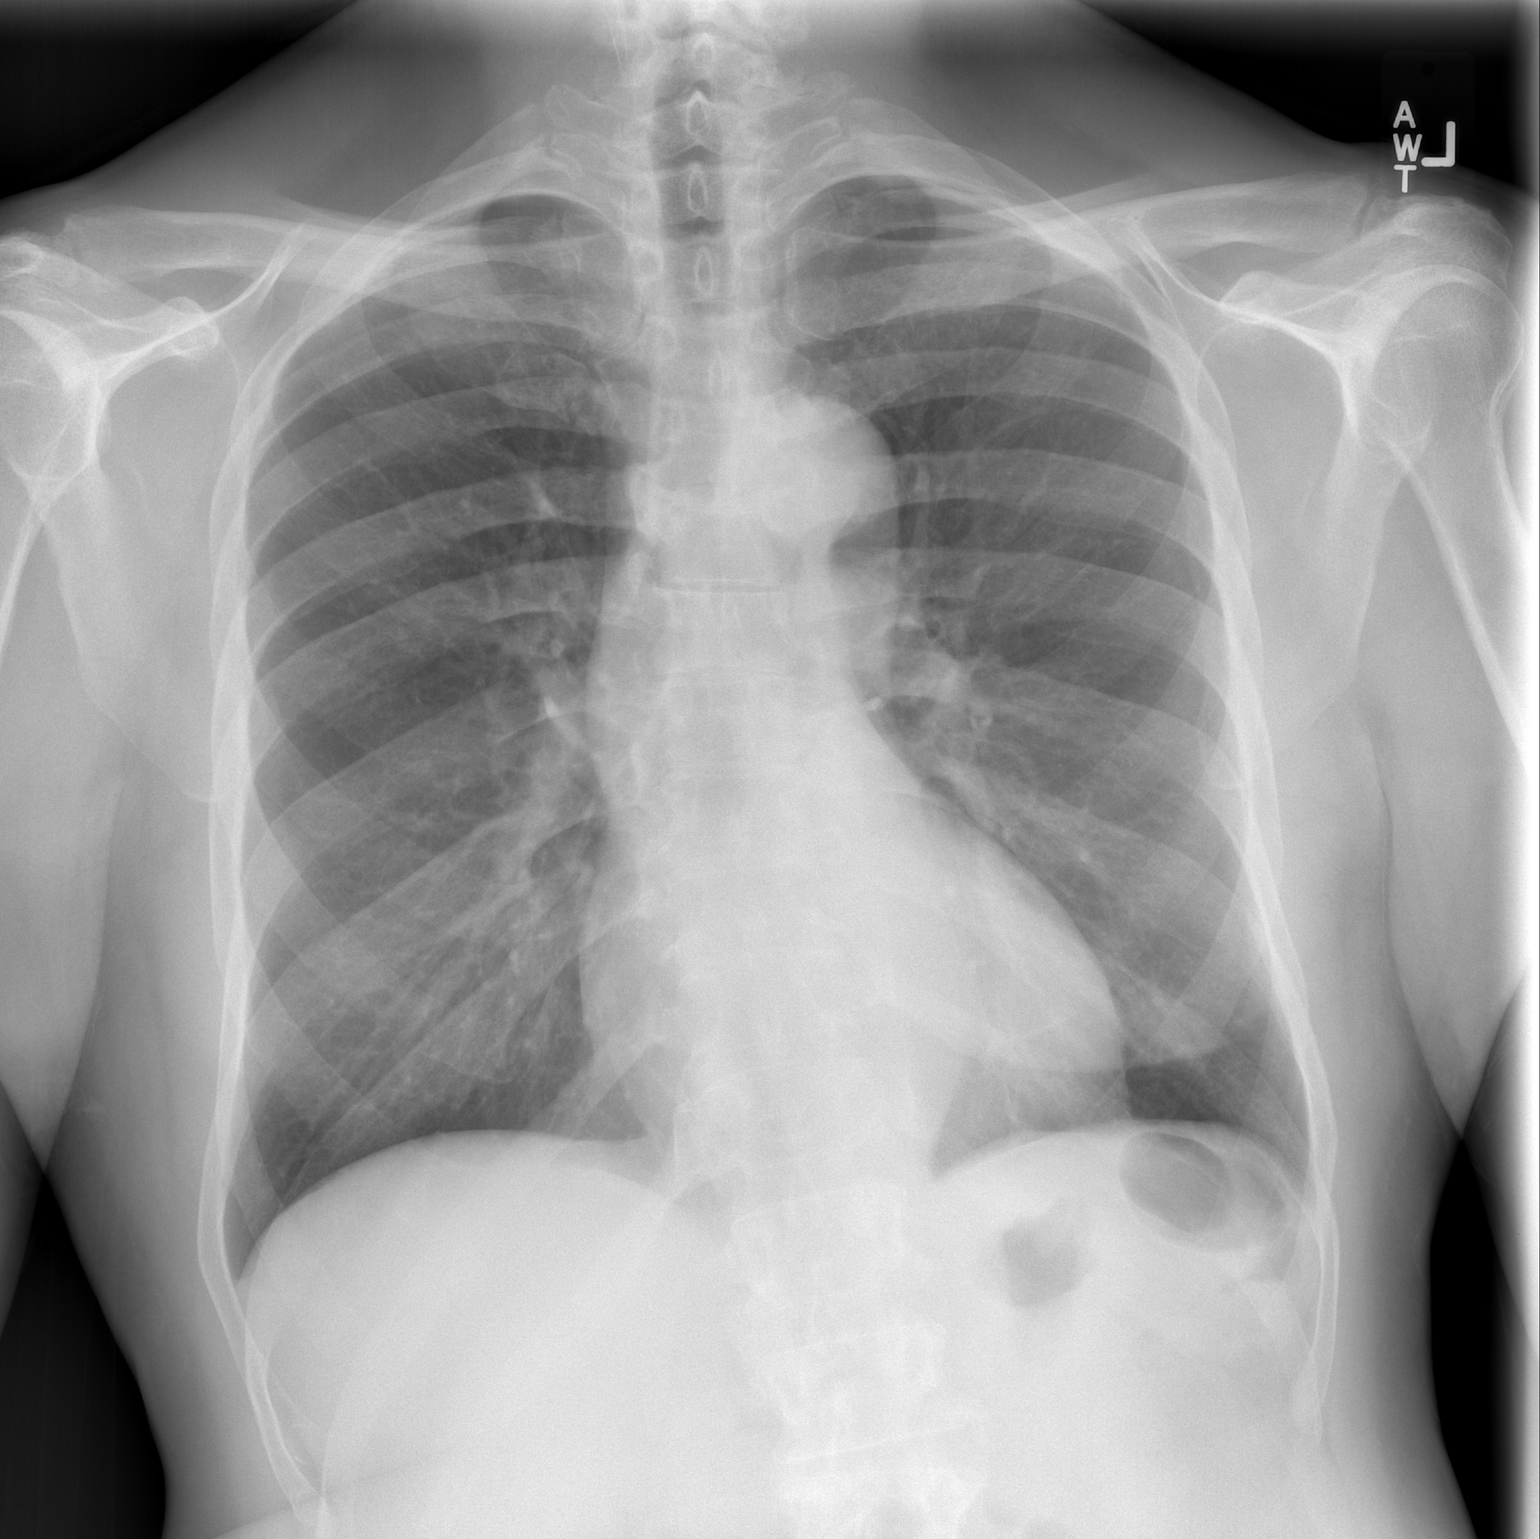

[w chest lat]
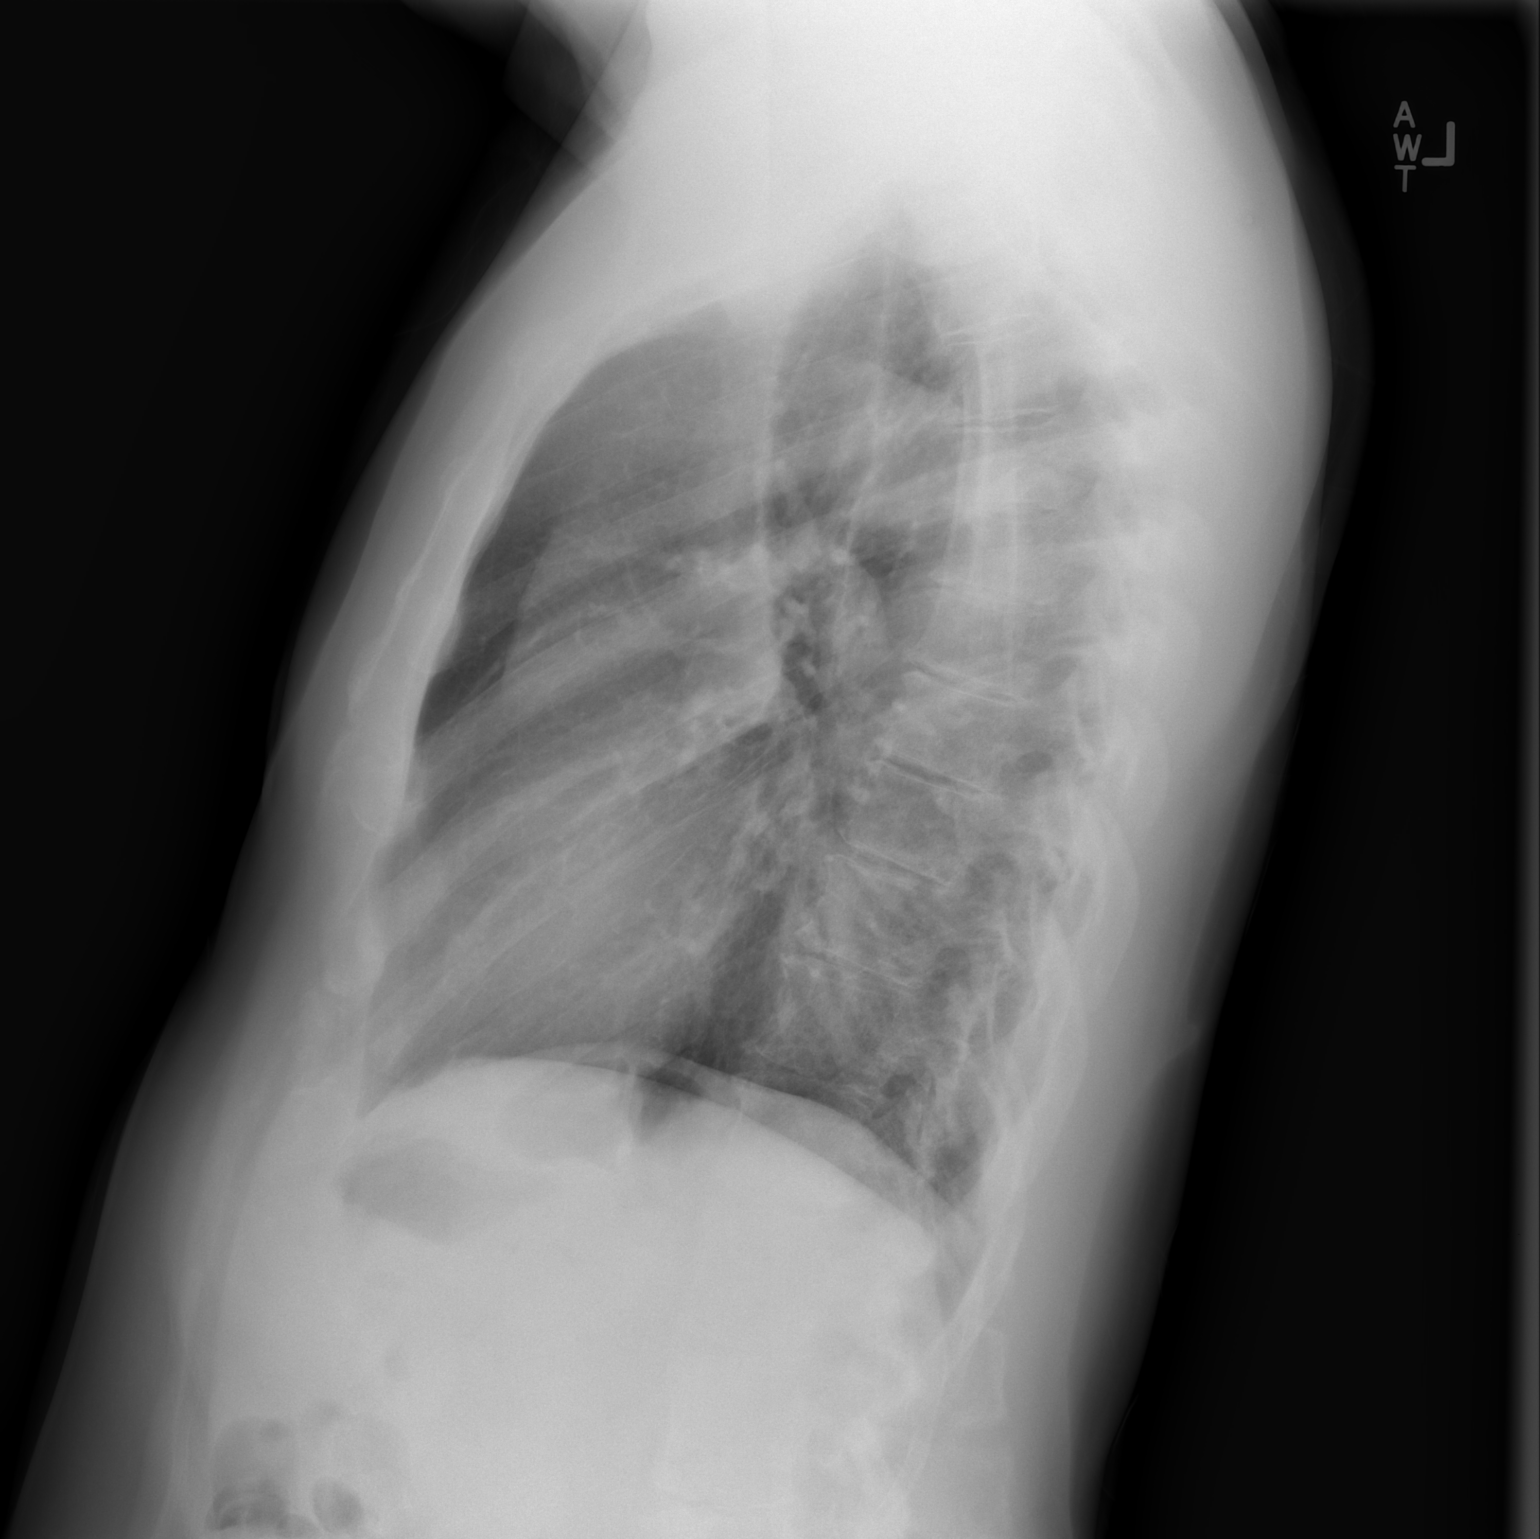

[2 of 2 positions shown; findings below may reference images not displayed]

FINDINGS: Mediastinum and hilar structures are normal. Lungs are clear. Heart
size normal. No pleural effusion or pneumothorax. Degenerative
changes thoracic spine.
IMPRESSION: No acute cardiopulmonary disease.

## 2015-12-14 DIAGNOSIS — C61 Malignant neoplasm of prostate: Secondary | ICD-10-CM | POA: Diagnosis not present

## 2015-12-21 DIAGNOSIS — C61 Malignant neoplasm of prostate: Secondary | ICD-10-CM | POA: Diagnosis not present

## 2016-03-30 ENCOUNTER — Other Ambulatory Visit: Payer: Self-pay | Admitting: Internal Medicine

## 2016-03-30 ENCOUNTER — Ambulatory Visit (INDEPENDENT_AMBULATORY_CARE_PROVIDER_SITE_OTHER): Payer: Medicare HMO | Admitting: Internal Medicine

## 2016-03-30 ENCOUNTER — Other Ambulatory Visit (INDEPENDENT_AMBULATORY_CARE_PROVIDER_SITE_OTHER): Payer: Medicare HMO

## 2016-03-30 ENCOUNTER — Encounter: Payer: Self-pay | Admitting: Internal Medicine

## 2016-03-30 VITALS — BP 138/84 | HR 73 | Temp 98.5°F | Ht 71.0 in | Wt 199.0 lb

## 2016-03-30 DIAGNOSIS — R030 Elevated blood-pressure reading, without diagnosis of hypertension: Secondary | ICD-10-CM

## 2016-03-30 DIAGNOSIS — E119 Type 2 diabetes mellitus without complications: Secondary | ICD-10-CM

## 2016-03-30 DIAGNOSIS — E785 Hyperlipidemia, unspecified: Secondary | ICD-10-CM

## 2016-03-30 DIAGNOSIS — Z0000 Encounter for general adult medical examination without abnormal findings: Secondary | ICD-10-CM

## 2016-03-30 LAB — CBC WITH DIFFERENTIAL/PLATELET
BASOS PCT: 1.3 % (ref 0.0–3.0)
Basophils Absolute: 0.1 10*3/uL (ref 0.0–0.1)
EOS PCT: 2.5 % (ref 0.0–5.0)
Eosinophils Absolute: 0.2 10*3/uL (ref 0.0–0.7)
HCT: 40 % (ref 39.0–52.0)
HEMOGLOBIN: 13.2 g/dL (ref 13.0–17.0)
Lymphocytes Relative: 15.7 % (ref 12.0–46.0)
Lymphs Abs: 1.1 10*3/uL (ref 0.7–4.0)
MCHC: 33.1 g/dL (ref 30.0–36.0)
MCV: 80.4 fl (ref 78.0–100.0)
MONO ABS: 0.5 10*3/uL (ref 0.1–1.0)
Monocytes Relative: 7.5 % (ref 3.0–12.0)
Neutro Abs: 5.2 10*3/uL (ref 1.4–7.7)
Neutrophils Relative %: 73 % (ref 43.0–77.0)
Platelets: 179 10*3/uL (ref 150.0–400.0)
RBC: 4.98 Mil/uL (ref 4.22–5.81)
RDW: 14.4 % (ref 11.5–15.5)
WBC: 7.1 10*3/uL (ref 4.0–10.5)

## 2016-03-30 LAB — URINALYSIS, ROUTINE W REFLEX MICROSCOPIC
Bilirubin Urine: NEGATIVE
Hgb urine dipstick: NEGATIVE
Ketones, ur: NEGATIVE
Leukocytes, UA: NEGATIVE
NITRITE: NEGATIVE
RBC / HPF: NONE SEEN (ref 0–?)
SPECIFIC GRAVITY, URINE: 1.02 (ref 1.000–1.030)
Total Protein, Urine: NEGATIVE
Urine Glucose: NEGATIVE
Urobilinogen, UA: 1 (ref 0.0–1.0)
WBC UA: NONE SEEN (ref 0–?)
pH: 6.5 (ref 5.0–8.0)

## 2016-03-30 LAB — HEPATIC FUNCTION PANEL
ALK PHOS: 43 U/L (ref 39–117)
ALT: 9 U/L (ref 0–53)
AST: 18 U/L (ref 0–37)
Albumin: 4.2 g/dL (ref 3.5–5.2)
BILIRUBIN DIRECT: 0.1 mg/dL (ref 0.0–0.3)
BILIRUBIN TOTAL: 0.5 mg/dL (ref 0.2–1.2)
Total Protein: 6.7 g/dL (ref 6.0–8.3)

## 2016-03-30 LAB — BASIC METABOLIC PANEL
BUN: 17 mg/dL (ref 6–23)
CO2: 30 mEq/L (ref 19–32)
Calcium: 9.1 mg/dL (ref 8.4–10.5)
Chloride: 105 mEq/L (ref 96–112)
Creatinine, Ser: 1.17 mg/dL (ref 0.40–1.50)
GFR: 78.89 mL/min (ref >60.00)
Glucose, Bld: 137 mg/dL — ABNORMAL HIGH (ref 70–99)
POTASSIUM: 4.1 meq/L (ref 3.5–5.1)
SODIUM: 140 meq/L (ref 135–145)

## 2016-03-30 LAB — TSH: TSH: 0.46 u[IU]/mL (ref 0.35–4.50)

## 2016-03-30 LAB — LIPID PANEL
CHOL/HDL RATIO: 3
Cholesterol: 180 mg/dL (ref 0–200)
HDL: 52.4 mg/dL (ref >39.00)
LDL CALC: 92 mg/dL (ref 0–99)
NONHDL: 127.99
Triglycerides: 179 mg/dL — ABNORMAL HIGH (ref 0.0–149.0)
VLDL: 35.8 mg/dL (ref 0.0–40.0)

## 2016-03-30 LAB — MICROALBUMIN / CREATININE URINE RATIO
CREATININE, U: 199.1 mg/dL
MICROALB/CREAT RATIO: 0.4 mg/g (ref 0.0–30.0)
Microalb, Ur: <0.7 mg/dL (ref 0.0–1.9)

## 2016-03-30 LAB — PSA: PSA: 0.9 ng/mL (ref 0.10–4.00)

## 2016-03-30 LAB — HEMOGLOBIN A1C: Hgb A1c MFr Bld: 7.4 % — ABNORMAL HIGH (ref 4.6–6.5)

## 2016-03-30 MED ORDER — SITAGLIPTIN PHOSPHATE 50 MG PO TABS: 50.0000 mg | ORAL_TABLET | Freq: Every day | ORAL | 3 refills | Status: DC

## 2016-03-30 NOTE — Patient Instructions (Addendum)
Please continue all other medications as before, and refills have been done if requested.  Please have the pharmacy call with any other refills you may need.  Please continue your efforts at being more active, low cholesterol diet, and weight control.  You are otherwise up to date with prevention measures today.  Please keep your appointments with your specialists as you may have planned  You will be contacted regarding the referral for: colonoscopy, and eye doctor  Please go to the LAB in the Basement (turn left off the elevator) for the tests to be done today  You will be contacted by phone if any changes need to be made immediately.  Otherwise, you will receive a letter about your results with an explanation, but please check with MyChart first.  Please remember to sign up for MyChart if you have not done so, as this will be important to you in the future with finding out test results, communicating by private email, and scheduling acute appointments online when needed.  If you have Medicare related insurance (such as traditoinal Medicare, [employer] or [employer], or similar), Please make an appointment at the Scheduling desk with Maudie Mercury, the [employer], for your Wellness Visit in this office, which is a benefit with your insurance.  Please return in 6 months, or sooner if needed, with Lab testing done 3-5 days before

## 2016-03-30 NOTE — Progress Notes (Signed)
Subjective:    Patient ID: Samuel Krueger, male    DOB: 02/06/1946, 71 y.o.   MRN: [id number]  HPI  Here for wellness and f/u;  Overall doing ok;  Pt denies Chest pain, worsening SOB, DOE, wheezing, orthopnea, PND, worsening LE edema, palpitations, dizziness or syncope.  Pt denies neurological change such as new headache, facial or extremity weakness.  Pt denies polydipsia, polyuria, or low sugar symptoms. Pt states overall good compliance with treatment and medications, good tolerability, and has been trying to follow appropriate diet.  Pt denies worsening depressive symptoms, suicidal ideation or panic. No fever, night sweats, wt loss, loss of appetite, or other constitutional symptoms.  Pt states good ability with ADL's, has low fall risk, home safety reviewed and adequate, no other significant changes in hearing or vision, and only occasionally active with exercise.  Has stable nocturia 2-3 times per night. Declines flu shot  Never took the OHA or statin, and only started the asa 3 days ago.  No other new hx Past Medical History:  Diagnosis Date  . Nocturia   . Prostate cancer [facility]) urologist-- dr Janice Norrie   active survellance since dx Feb 2014--  now has Stage T1c, Gleason (3+4)7, PSA 7.75  . Recovering alcoholic in remission ([place])    since rehab 20 yrs ago-- approx 1995  . S/P radiation therapy 07/15/14   Radioactive Seed Implant- Prostate/proximal seminal vesicles, 14,500 cGy utilizing 74 I-125 seeds implanted in 30 needles with an individual seed activity 0.658 mCi per seed for a total implant activity of 48.6.millicuries.  . Tinnitus of both ears   . Type 2 diabetes, diet controlled ([place])    Past Surgical History:  Procedure Laterality Date  . PROSTATE BIOPSY  03/26/14  . RADIOACTIVE SEED IMPLANT N/A 07/15/2014   Procedure: RADIOACTIVE SEED IMPLANT/BRACHYTHERAPY IMPLANT;  Surgeon: Lowella Bandy, MD;  Location: [facility];  Service: Urology;  Laterality: N/A;    reports that he  quit smoking about 15 years ago. His smoking use included Cigarettes. He quit after 0.80 years of use. He has never used smokeless tobacco. He reports that he does not drink alcohol or use drugs. family history includes Alcohol abuse in his mother; HIV/AIDS in his brother; Heart disease in his mother; Hypertension in his mother; Mental illness in his other. Allergies  Allergen Reactions  . Metformin And Related Other (See Comments)    Chest tightness  . Shrimp [Shellfish Allergy] Other (See Comments)    "strange GI reaction"   Current Outpatient Prescriptions on File Prior to Visit  Medication Sig Dispense Refill  . aspirin EC 81 MG tablet Take 1 tablet (81 mg total) by mouth daily. 90 tablet 11  . glipiZIDE (GLUCOTROL XL) 5 MG 24 hr tablet Take 1 tablet (5 mg total) by mouth daily with breakfast. 90 tablet 3  . rosuvastatin (CRESTOR) 10 MG tablet Take 1 tablet (10 mg total) by mouth daily. 90 tablet 3   No current facility-administered medications on file prior to visit.    Review of Systems Constitutional: Negative for increased diaphoresis, or other activity, appetite or siginficant weight change other than noted HENT: Negative for worsening hearing loss, ear pain, facial swelling, mouth sores and neck stiffness.   Eyes: Negative for other worsening pain, redness or visual disturbance.  Respiratory: Negative for choking or stridor Cardiovascular: Negative for other chest pain and palpitations.  Gastrointestinal: Negative for worsening diarrhea, blood in stool, or abdominal distention Genitourinary: Negative for hematuria, flank pain  or change in urine volume.  Musculoskeletal: Negative for myalgias or other joint complaints.  Skin: Negative for other color change and wound or drainage.  Neurological: Negative for syncope and numbness. other than noted Hematological: Negative for adenopathy. or other swelling Psychiatric/Behavioral: Negative for hallucinations, SI, self-injury,  decreased concentration or other worsening agitation.  All other system neg per pt    Objective:   Physical Exam BP 138/84   Pulse 73   Temp 98.5 F (36.9 C)   Ht 5\' 11"  (1.803 m)   Wt 199 lb (90.3 kg)   SpO2 99%   BMI 27.75 kg/m  VS noted,  Constitutional: Pt is oriented to person, place, and time. Appears well-developed and well-nourished, in no significant distress Head: Normocephalic and atraumatic  Eyes: Conjunctivae and EOM are normal. Pupils are equal, round, and reactive to light Right Ear: External ear normal.  Left Ear: External ear normal Nose: Nose normal.  Mouth/Throat: Oropharynx is clear and moist  Neck: Normal range of motion. Neck supple. No JVD present. No tracheal deviation present or significant neck LA or mass Cardiovascular: Normal rate, regular rhythm, normal heart sounds and intact distal pulses.   Pulmonary/Chest: Effort normal and breath sounds without rales or wheezing  Abdominal: Soft. Bowel sounds are normal. NT. No HSM  Musculoskeletal: Normal range of motion. Exhibits no edema Lymphadenopathy: Has no cervical adenopathy.  Neurological: Pt is alert and oriented to person, place, and time. Pt has normal reflexes. No cranial nerve deficit. Motor grossly intact Skin: Skin is warm and dry. No rash noted or new ulcers Psychiatric:  Has normal mood and affect. Behavior is normal.  No other new exam findings  ECG today I have personally intepreted Sinus  Rhythm  -RSR(V1) -nondiagnostic.  -  NORMAL    Assessment & Plan:

## 2016-04-03 NOTE — Assessment & Plan Note (Signed)
stable overall by history and exam, recent data reviewed with pt, and pt to continue medical treatment as before,  to f/u any worsening symptoms or concerns BP Readings from Last 3 Encounters:  03/30/16 138/84  09/23/15 140/76  05/06/15 122/88

## 2016-04-03 NOTE — Assessment & Plan Note (Signed)

## 2016-04-03 NOTE — Assessment & Plan Note (Signed)
Lab Results  Component Value Date   LDLCALC 92 03/30/2016   stable overall by history and exam, recent data reviewed with pt, and pt to continue medical treatment as before,  to f/u any worsening symptoms or concerns

## 2016-04-03 NOTE — Assessment & Plan Note (Signed)
stable overall by history and exam, recent data reviewed with pt, and pt to continue medical treatment as before,  to f/u any worsening symptoms or concerns Lab Results  Component Value Date   HGBA1C 7.4 (H) 03/30/2016

## 2016-04-04 ENCOUNTER — Encounter: Payer: Self-pay | Admitting: Gastroenterology

## 2016-04-28 ENCOUNTER — Ambulatory Visit (AMBULATORY_SURGERY_CENTER): Payer: Self-pay

## 2016-04-28 VITALS — Ht 70.5 in | Wt 188.0 lb

## 2016-04-28 DIAGNOSIS — Z1211 Encounter for screening for malignant neoplasm of colon: Secondary | ICD-10-CM

## 2016-04-28 MED ORDER — SUPREP BOWEL PREP KIT 17.5-3.13-1.6 GM/177ML PO SOLN: 1.0000 | Freq: Once | ORAL | 0 refills | Status: AC

## 2016-04-28 NOTE — Progress Notes (Signed)
No allergies to eggs or soy No home oxygen No diet meds No past problems with anesthesia  Declined emmi  

## 2016-05-03 ENCOUNTER — Encounter: Payer: Self-pay | Admitting: Gastroenterology

## 2016-05-12 ENCOUNTER — Encounter: Payer: Medicare HMO | Admitting: Gastroenterology

## 2016-06-11 IMAGING — DX DG HAND COMPLETE 3+V*L*
3 series · 3 of 3 positions shown · non-contrast
Comparison: None

CLINICAL DATA: Graft by the wrist very hard 2 days ago, LEFT wrist
pain, swelling LEFT wrist and hand

EXAM:
LEFT HAND - COMPLETE 3+ VIEW

[hand pa]
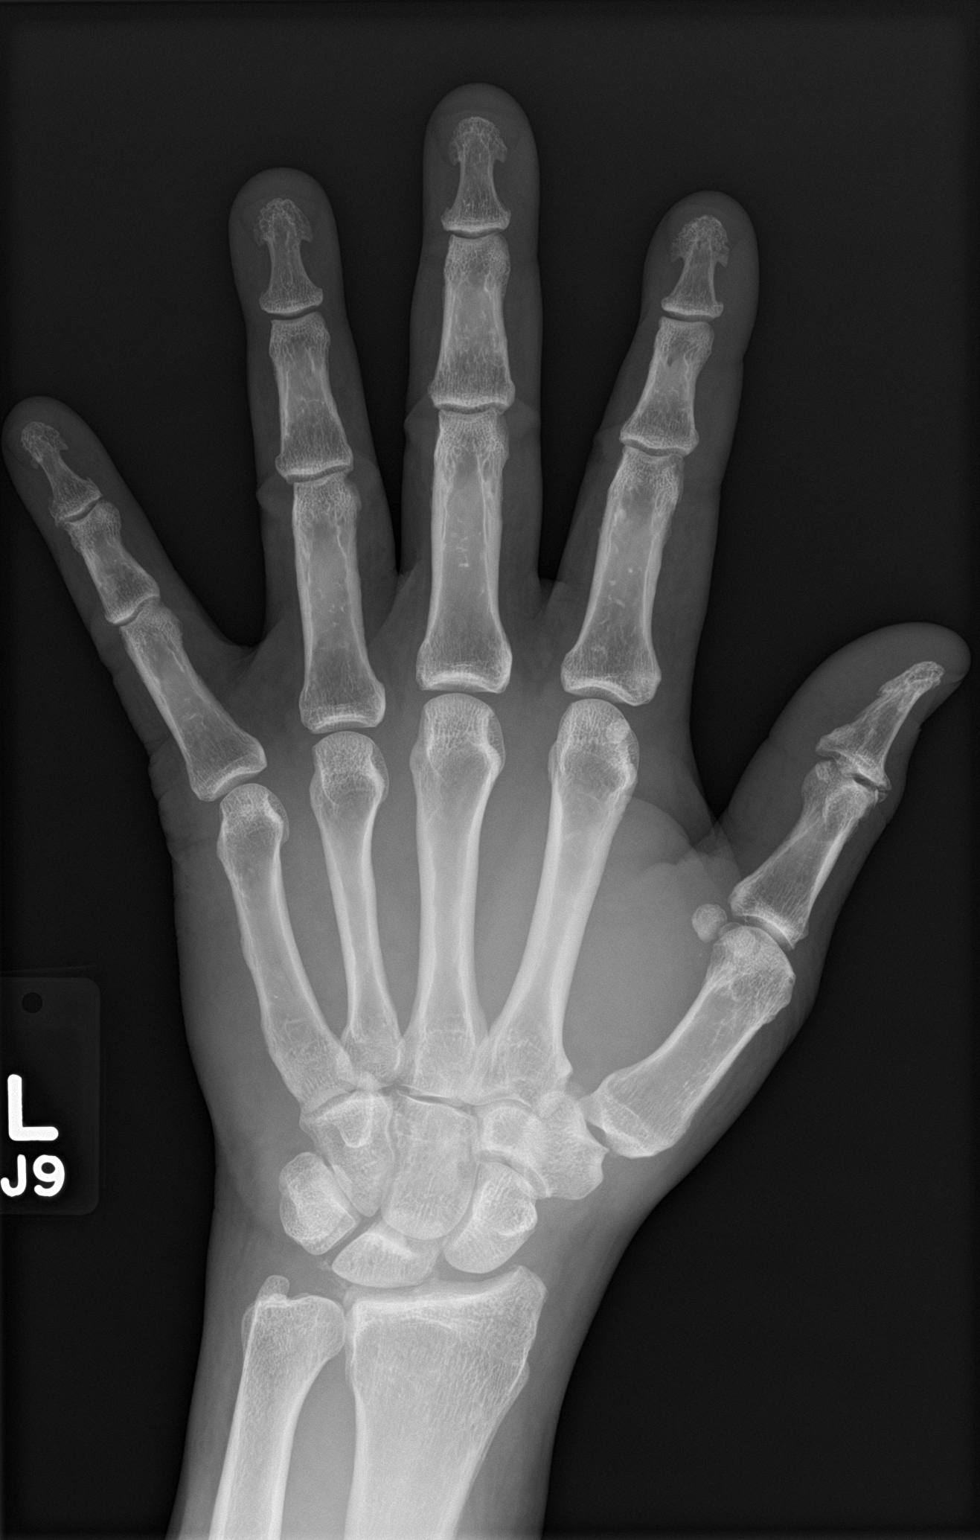

[hand obl]
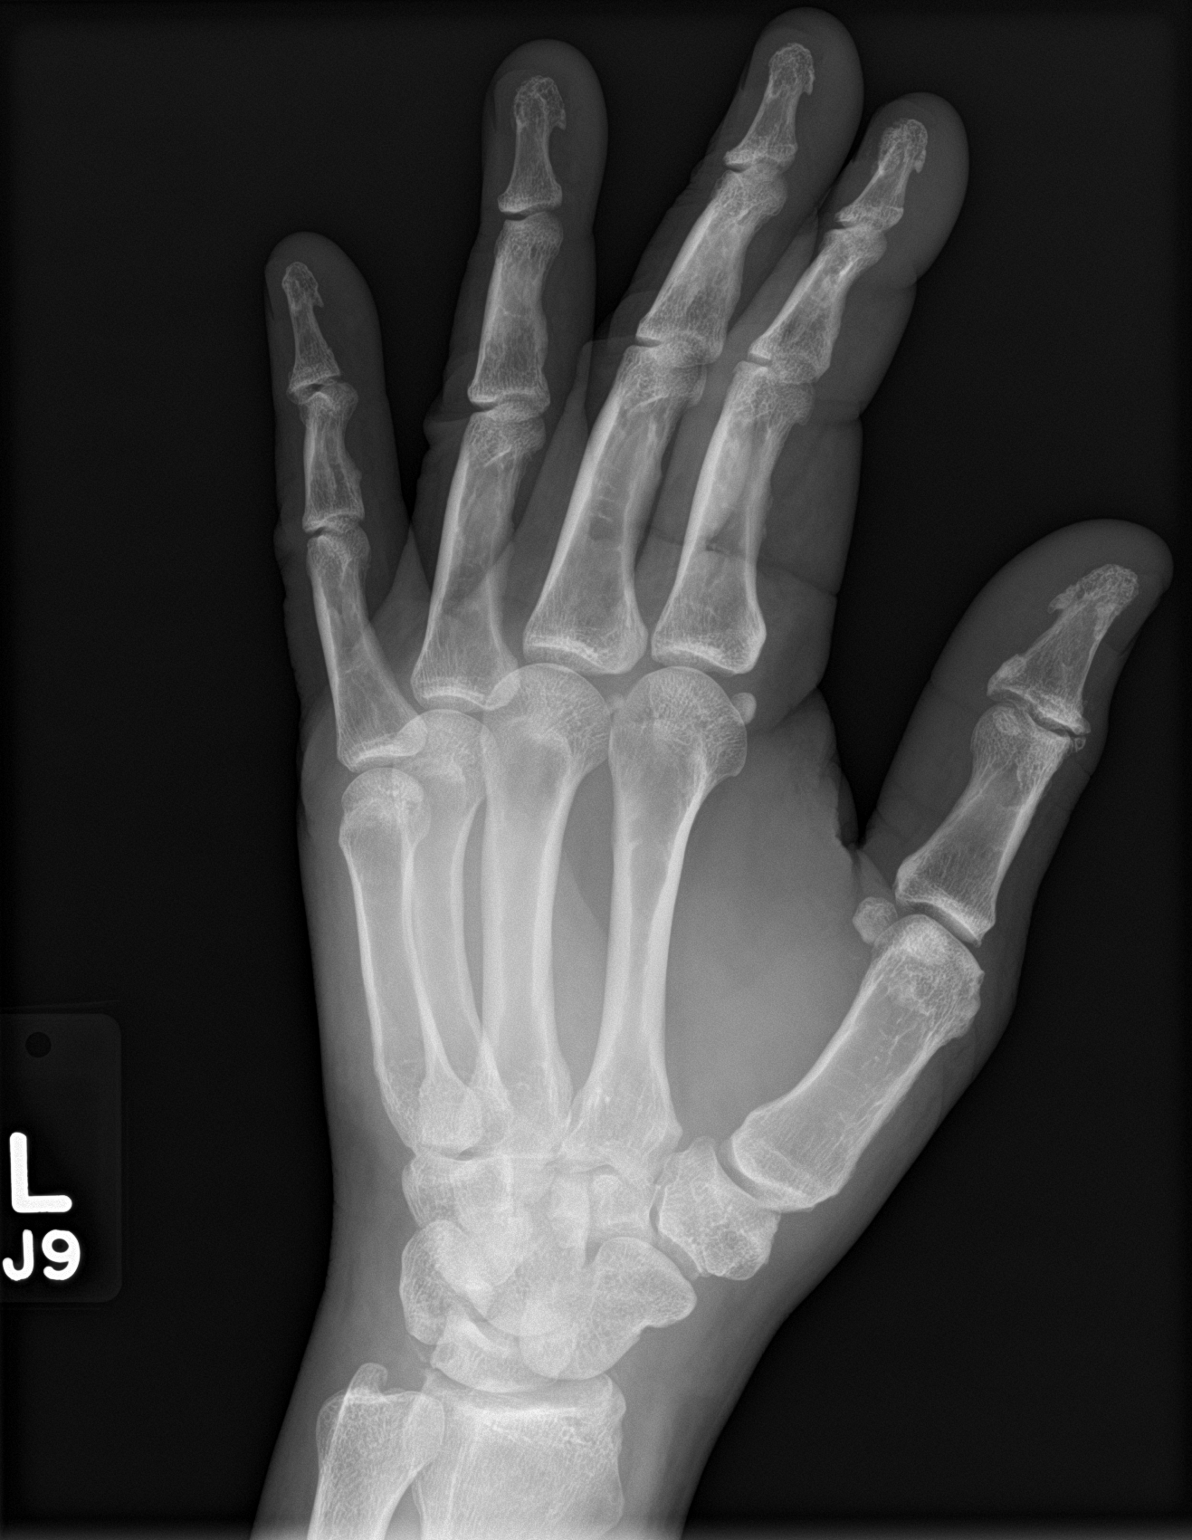

[hand lat]
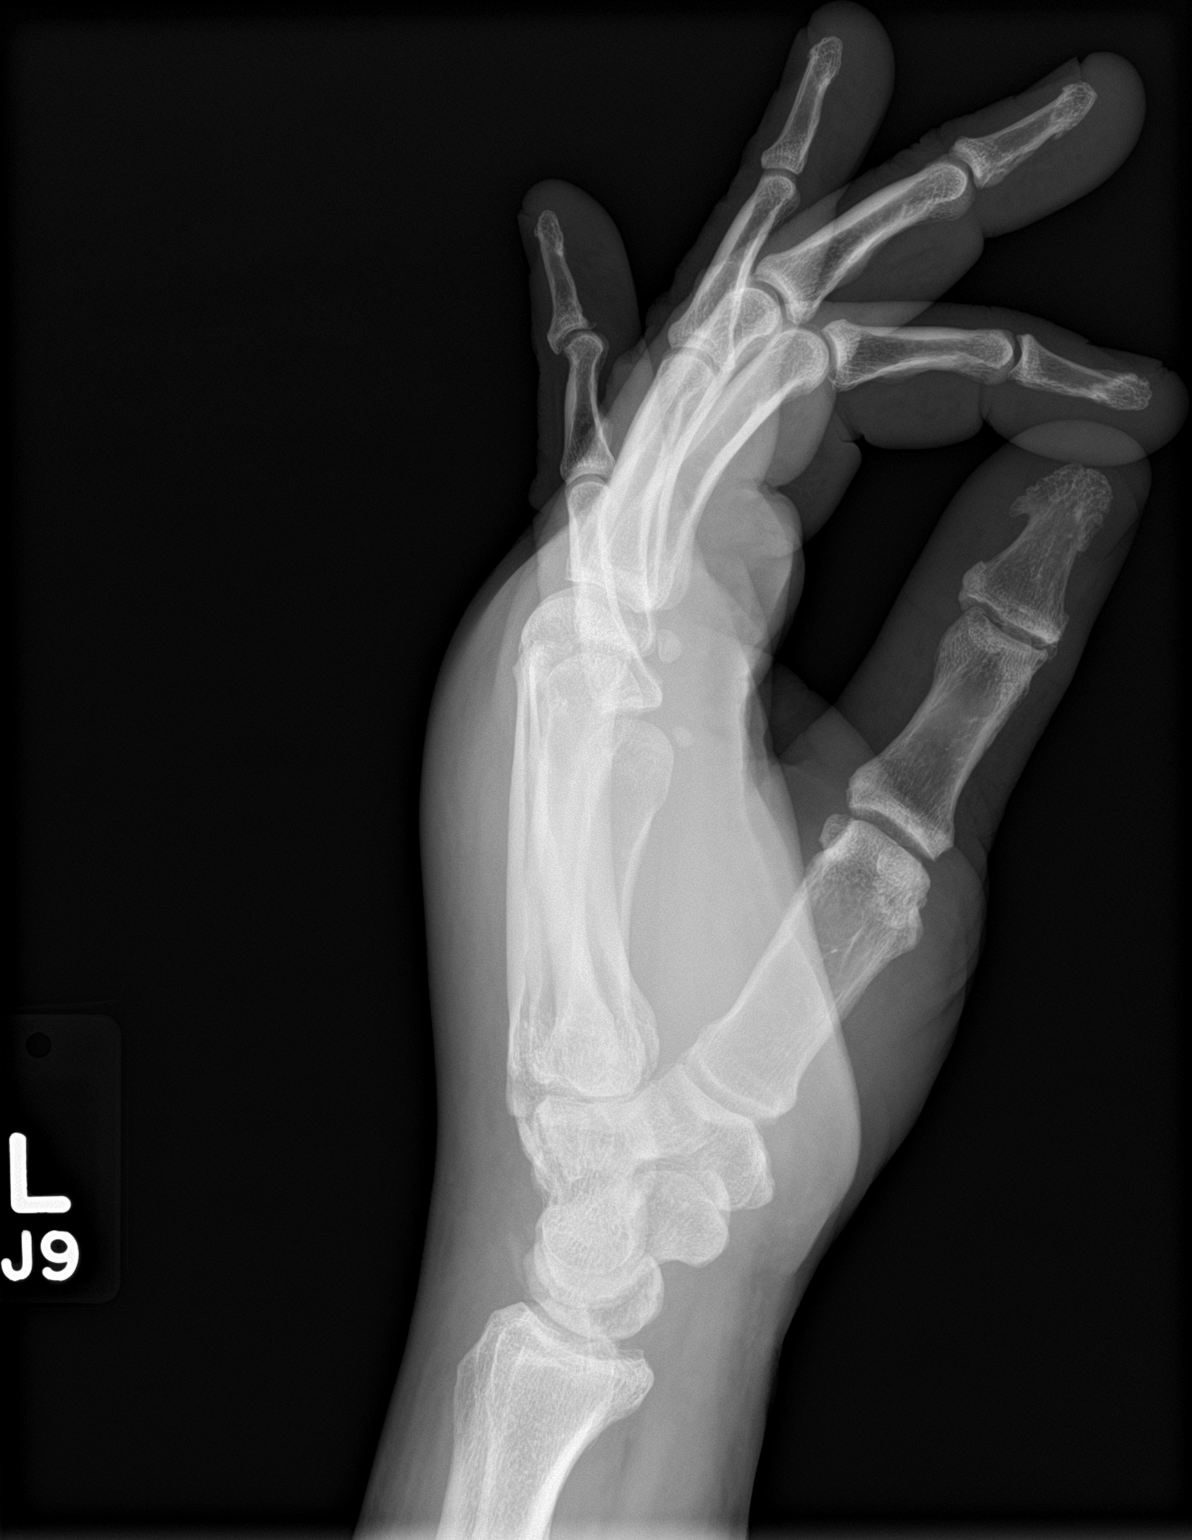

[3 of 3 positions shown; findings below may reference images not displayed]

FINDINGS: Mild soft tissue swelling.

Osseous mineralization normal.

Chondrocalcinosis at wrist.

Degenerative changes at IP joint thumb and at DIP joint little
finger.

Joint spaces otherwise preserved.

No acute fracture, dislocation or bone destruction.
IMPRESSION: Soft tissue swelling greatest at dorsum of hand without acute
fracture or dislocation.

Minimal degenerative changes at IP joints with chondrocalcinosis
question CPPD/pseudogout at wrist.

## 2016-06-20 DIAGNOSIS — C61 Malignant neoplasm of prostate: Secondary | ICD-10-CM | POA: Diagnosis not present

## 2016-07-14 NOTE — Progress Notes (Signed)
Pre visit review using our clinic review tool, if applicable. No additional management support is needed unless otherwise documented below in the visit note. 

## 2016-07-14 NOTE — Progress Notes (Addendum)
Subjective:   Samuel Krueger is a 71 y.o. male who presents for an Initial Medicare Annual Wellness Visit.  Review of Systems  No ROS.  Medicare Wellness Visit.  Cardiac Risk Factors include: advanced age (>22mn, >>101women);diabetes mellitus;hypertension Sleep patterns:  gets up 1-2 times nightly to void and sleeps 5-6 hours nightly. Patient reports insomnia issues, discussed recommended sleep tips and stress reduction tips.  Home Safety/Smoke Alarms: Feels safe in home. Smoke alarms in place.  Living environment; residence and Firearm Safety: 1-story house/ trailer, no firearms. Lives with roomates, no needs for DME Seat Belt Safety/Bike Helmet: Wears seat belt.   Counseling:   Eye Exam-  Has an upcoming appointment Dental- resources provided   Male:   CCS- Last 12/30/02, recall 10 years, [facility]  Lab Results  Component Value Date   PSA 0.90 03/30/2016   PSA 2.31 03/26/2015   PSA 5.19 (H) 11/02/2011      Objective:    Today's Vitals   07/15/16 1533  BP: 138/90  Pulse: 88  Resp: 20  SpO2: 98%  Weight: 189 lb (85.7 kg)  Height: 5' 11"  (1.803 m)   Body mass index is 26.36 kg/m.  Current Medications (verified) Outpatient Encounter Prescriptions as of 07/15/2016  Medication Sig  . aspirin EC 81 MG tablet Take 1 tablet (81 mg total) by mouth daily.  . [DISCONTINUED] glipiZIDE (GLUCOTROL XL) 5 MG 24 hr tablet Take 1 tablet (5 mg total) by mouth daily with breakfast. (Patient not taking: Reported on 04/28/2016)  . [DISCONTINUED] rosuvastatin (CRESTOR) 10 MG tablet Take 1 tablet (10 mg total) by mouth daily. (Patient not taking: Reported on 04/28/2016)   No facility-administered encounter medications on file as of 07/15/2016.     Allergies (verified) Metformin and related and Shrimp [shellfish allergy]   History: Past Medical History:  Diagnosis Date  . Hyperlipidemia   . Nocturia   . Prostate cancer ([facility] urolog[facility]-- dr nJanice Norrie  active  survellance since dx Feb 2014--  now has Stage T1c, Gleason (3+4)7, PSA 7.75  . [place]    since [place]hab 20 yrs ago-- approx 1995  . S/P radiation therapy 07/15/14   Radioactive Seed Implant- Prostate/proximal seminal vesicles, 14,500 cGy utilizing 74 I-125 seeds implanted in 30 needles with an individual seed activity 0.658 mCi per seed for a total implant activity of 48.6.millicuries.  . Tinnitus of both ears   . Type 2 diabetes, diet controlled [place]    Past Surgical History:  Procedure Laterality Date  . PROSTATE BIOPSY  03/26/14  . RADIOACTIVE SEED IMPLANT N/A 07/15/2014   Procedure: RADIOACTIVE SEED IMPLANT/BRACHYTHERAPY IMPLANT;  Surgeon: MLowella Bandy MD;  [facility]  Service: Urology;  Laterality: N/A;   Family History  Problem Relation Age of Onset  . Alcohol abuse Mother   . Heart disease Mother   . Hypertension Mother   . Mental illness Other   . HIV/AIDS Brother   . Colon cancer Neg Hx    Social History   Occupational History  . retired    Social History Main Topics  . Smoking status: Former Smoker    Years: 0.80    Types: Cigarettes    Quit date: 07/07/2000  . Smokeless tobacco: Never Used  . Alcohol use No     Comment: Recovering alcoholic since [id number] . Drug use: No  . Sexual activity: Not on file   Tobacco Counseling Counseling given: Not  Answered   Activities of Daily Living In your present state of health, do you have any difficulty performing the following activities: 07/15/2016  Hearing? N  Vision? N  Difficulty concentrating or making decisions? N  Walking or climbing stairs? N  Dressing or bathing? N  Doing errands, shopping? N  Preparing Food and eating ? N  Using the Toilet? N  In the past six months, have you accidently leaked urine? N  Do you have problems with loss of bowel control? N  Managing your Medications? N  Managing your Finances? N  Housekeeping or managing your Housekeeping? N    Some recent data might be hidden    Immunizations and Health Maintenance Immunization History  Administered Date(s) Administered  . Tdap 03/26/2015   Health Maintenance Due  Topic Date Due  . OPHTHALMOLOGY EXAM  08/09/1955  . PNA vac Low Risk Adult (1 of 2 - PCV13) 08/08/2009  . COLONOSCOPY  12/29/2012  . FOOT EXAM  03/25/2016    Patient Care Team: Biagio Borg, MD as PCP - General (Internal Medicine)  Indicate any recent Medical Services you may have received from other than [facility] providers in the past year (date may be approximate).    Assessment:   This is a routine wellness examination for [facility]. Physical assessment deferred to PCP.   Hearing/Vision screen Hearing Screening Comments:  Reports history of ringing of his ears. Able to hear conversational tones w/o difficulty.    Dietary issues and exercise activities discussed: Current Exercise Habits: Structured exercise class, Type of exercise: calisthenics;strength training/weights, Time (Minutes): 60, Frequency (Times/Week): 3, Weekly Exercise (Minutes/Week): 180, Intensity: Moderate, Exercise limited by: None identified  Diet (meal preparation, eat out, water intake, caffeinated beverages, dairy products, fruits and vegetables): in general, a "healthy" diet  , well balanced, diabetic, low fat/ cholesterol, low salt   Reviewed diabetic and heart healthy diet, encouraged patient to increase daily water intake, Diet education was attached to patient's AVS.  Goals    . Continue to be as healthy as I can possibly be          Continue to exercise and eat as healthy as possible    . Weight (lb) < 200 lb (90.7 kg)      Depression Screen PHQ 2/9 Scores 07/15/2016 03/30/2016 03/26/2015 08/07/2014  PHQ - 2 Score 0 0 1 1    Fall Risk Fall Risk  07/15/2016 03/30/2016 03/26/2015 08/07/2014 05/27/2014  Falls in the past year? No No No No No    Cognitive Function:       Ad8 score reviewed for issues:  Issues making decisions:  no  Less interest in hobbies / activities: no  Repeats questions, stories (family complaining): no  Trouble using ordinary gadgets (microwave, computer, phone): no  Forgets the month or year: no  Mismanaging finances: no  Remembering appts:no  Daily problems with thinking and/or memory: no Ad8 score is= 0  Screening Tests Health Maintenance  Topic Date Due  . OPHTHALMOLOGY EXAM  [birth year]09/1955  . PNA vac Low Risk Adult (1 of 2 - PCV13) 08/08/2009  . COLONOSCOPY  12/29/2012  . FOOT EXAM  03/25/2016  . INFLUENZA VACCINE  09/07/2016  . HEMOGLOBIN A1C  09/27/2016  . URINE MICROALBUMIN  03/30/2017  . TETANUS/TDAP  03/25/2025  . Hepatitis C Screening  Completed        Plan:    Continue doing brain stimulating activities (puzzles, reading, adult coloring books, staying active) to keep memory sharp.  Continue to eat heart healthy diet (full of fruits, vegetables, whole grains, lean protein, water--limit salt, fat, and sugar intake) and increase physical activity as tolerated.   I have personally reviewed and noted the following in the patient's chart:   . Medical and social history . Use of alcohol, tobacco or illicit drugs  . Current medications and supplements . Functional ability and status . Nutritional status . Physical activity . Advanced directives . List of other physicians . Vitals . Screenings to include cognitive, depression, and falls . Referrals and appointments  In addition, I have reviewed and discussed with patient certain preventive protocols, quality metrics, and best practice recommendations. A written personalized care plan for preventive services as well as general preventive health recommendations were provided to patient.     Michiel Cowboy, RN   07/15/2016    Medical screening examination/treatment/procedure(s) were performed by non-physician practitioner and as supervising physician I was immediately available for consultation/collaboration. I  agree with above. Binnie Rail, MD

## 2016-07-15 ENCOUNTER — Ambulatory Visit (INDEPENDENT_AMBULATORY_CARE_PROVIDER_SITE_OTHER): Payer: Medicare HMO | Admitting: *Deleted

## 2016-07-15 VITALS — BP 138/90 | HR 88 | Resp 20 | Ht 71.0 in | Wt 189.0 lb

## 2016-07-15 DIAGNOSIS — Z0000 Encounter for general adult medical examination without abnormal findings: Secondary | ICD-10-CM | POA: Diagnosis not present

## 2016-07-15 NOTE — Patient Instructions (Addendum)
Continue doing brain stimulating activities (puzzles, reading, adult coloring books, staying active) to keep memory sharp.   Continue to eat heart healthy diet (full of fruits, vegetables, whole grains, lean protein, water--limit salt, fat, and sugar intake) and increase physical activity as tolerated.   [facility] [place], [place] Address: [address], [place], [place] [address] Hours: Closed ? Tobie Poet Mon Phone: [phone] Call to make appointment to get toe nails cut  Carbohydrates 45-65 grams per meals  Diabetes Mellitus and Food It is important for you to manage your blood sugar (glucose) level. Your blood glucose level can be greatly affected by what you eat. Eating healthier foods in the appropriate amounts throughout the day at about the same time each day will help you control your blood glucose level. It can also help slow or prevent worsening of your diabetes mellitus. Healthy eating may even help you improve the level of your blood pressure and reach or maintain a healthy weight. General recommendations for healthful eating and cooking habits include:  Eating meals and snacks regularly. Avoid going long periods of time without eating to lose weight.  Eating a diet that consists mainly of plant-based foods, such as fruits, vegetables, nuts, legumes, and whole grains.  Using low-heat cooking methods, such as baking, instead of high-heat cooking methods, such as deep frying.  Work with your dietitian to make sure you understand how to use the Nutrition Facts information on food labels. How can food affect me? Carbohydrates Carbohydrates affect your blood glucose level more than any other type of food. Your dietitian will help you determine how many carbohydrates to eat at each meal and teach you how to count carbohydrates. Counting carbohydrates is important to keep your blood glucose at a healthy level, especially if you  are using insulin or taking certain medicines for diabetes mellitus. Alcohol Alcohol can cause sudden decreases in blood glucose (hypoglycemia), especially if you use insulin or take certain medicines for diabetes mellitus. Hypoglycemia can be a life-threatening condition. Symptoms of hypoglycemia (sleepiness, dizziness, and disorientation) are similar to symptoms of having too much alcohol. If your health care provider has given you approval to drink alcohol, do so in moderation and use the following guidelines:  Women should not have more than one drink per day, and men should not have more than two drinks per day. One drink is equal to: ? 12 oz of beer. ? 5 oz of wine. ? 1 oz of hard liquor.  Do not drink on an empty stomach.  Keep yourself hydrated. Have water, diet soda, or unsweetened iced tea.  Regular soda, juice, and other mixers might contain a lot of carbohydrates and should be counted.  What foods are not recommended? As you make food choices, it is important to remember that all foods are not the same. Some foods have fewer nutrients per serving than other foods, even though they might have the same number of calories or carbohydrates. It is difficult to get your body what it needs when you eat foods with fewer nutrients. Examples of foods that you should avoid that are high in calories and carbohydrates but low in nutrients include:  Trans fats (most processed foods list trans fats on the Nutrition Facts label).  Regular soda.  Juice.  Candy.  Sweets, such as cake, pie, doughnuts, and cookies.  Fried foods.  What foods can I eat? Eat nutrient-rich foods, which will nourish your body and  keep you healthy. The food you should eat also will depend on several factors, including:  The calories you need.  The medicines you take.  Your weight.  Your blood glucose level.  Your blood pressure level.  Your cholesterol level.  You should eat a variety of foods,  including:  Protein. ? Lean cuts of meat. ? Proteins low in saturated fats, such as fish, egg whites, and beans. Avoid processed meats.  Fruits and vegetables. ? Fruits and vegetables that may help control blood glucose levels, such as apples, mangoes, and yams.  Dairy products. ? Choose fat-free or low-fat dairy products, such as milk, yogurt, and cheese.  Grains, bread, pasta, and rice. ? Choose whole grain products, such as multigrain bread, whole oats, and brown rice. These foods may help control blood pressure.  Fats. ? Foods containing healthful fats, such as nuts, avocado, olive oil, canola oil, and fish.  Does everyone with diabetes mellitus have the same meal plan? Because every person with diabetes mellitus is different, there is not one meal plan that works for everyone. It is very important that you meet with a dietitian who will help you create a meal plan that is just right for you. This information is not intended to replace advice given to you by your health care provider. Make sure you discuss any questions you have with your health care provider. Document Released: 10/21/2004 Document Revised: 07/02/2015 Document Reviewed: 12/21/2012 Elsevier Interactive Patient Education  2017 [place] DASH stands for "Dietary Approaches to Stop Hypertension." The DASH eating plan is a healthy eating plan that has been shown to reduce high blood pressure (hypertension). It may also reduce your risk for type 2 diabetes, heart disease, and stroke. The DASH eating plan may also help with weight loss. What are tips for following this plan? General guidelines  Avoid eating more than 2,300 mg (milligrams) of salt (sodium) a day. If you have hypertension, you may need to reduce your sodium intake to 1,500 mg a day.  Limit alcohol intake to no more than 1 drink a day for nonpregnant women and 2 drinks a day for men. One drink equals 12 oz of beer, 5 oz of wine, or 1  oz of hard liquor.  Work with your health care provider to maintain a healthy body weight or to lose weight. Ask what an ideal weight is for you.  Get at least 30 minutes of exercise that causes your heart to beat faster (aerobic exercise) most days of the week. Activities may include walking, swimming, or biking.  Work with your health care provider or diet and nutrition specialist (dietitian) to adjust your eating plan to your individual calorie needs. Reading food labels  Check food labels for the amount of sodium per serving. Choose foods with less than 5 percent of the Daily Value of sodium. Generally, foods with less than 300 mg of sodium per serving fit into this eating plan.  To find whole grains, look for the word "whole" as the first word in the ingredient list. Shopping  Buy products labeled as "low-sodium" or "no salt added."  Buy fresh foods. Avoid canned foods and premade or frozen meals. Cooking  Avoid adding salt when cooking. Use salt-free seasonings or herbs instead of table salt or sea salt. Check with your health care provider or pharmacist before using salt substitutes.  Do not fry foods. Cook foods using healthy methods such as baking, boiling, grilling, and broiling instead.  Cook with heart-healthy oils, such as olive, canola, soybean, or sunflower oil. Meal planning   Eat a balanced diet that includes: ? 5 or more servings of fruits and vegetables each day. At each meal, try to fill half of your plate with fruits and vegetables. ? Up to 6-8 servings of whole grains each day. ? Less than 6 oz of lean meat, poultry, or fish each day. A 3-oz serving of meat is about the same size as a deck of cards. One egg equals 1 oz. ? 2 servings of low-fat dairy each day. ? A serving of nuts, seeds, or beans 5 times each week. ? Heart-healthy fats. Healthy fats called Omega-3 fatty acids are found in foods such as flaxseeds and coldwater fish, like sardines, salmon, and  mackerel.  Limit how much you eat of the following: ? Canned or prepackaged foods. ? Food that is high in trans fat, such as fried foods. ? Food that is high in saturated fat, such as fatty meat. ? Sweets, desserts, sugary drinks, and other foods with added sugar. ? Full-fat dairy products.  Do not salt foods before eating.  Try to eat at least 2 vegetarian meals each week.  Eat more home-cooked food and less restaurant, buffet, and fast food.  When eating at a restaurant, ask that your food be prepared with less salt or no salt, if possible. What foods are recommended? The items listed may not be a complete list. Talk with your dietitian about what dietary choices are best for you. Grains Whole-grain or whole-wheat bread. Whole-grain or whole-wheat pasta. Brown rice. [place]. Bulgur. Whole-grain and low-sodium cereals. Pita bread. Low-fat, low-sodium crackers. Whole-wheat flour tortillas. Vegetables Fresh or frozen vegetables (raw, steamed, roasted, or grilled). Low-sodium or reduced-sodium tomato and vegetable juice. Low-sodium or reduced-sodium tomato sauce and tomato paste. Low-sodium or reduced-sodium canned vegetables. Fruits All fresh, dried, or frozen fruit. Canned fruit in natural juice (without added sugar). Meat and other protein foods Skinless chicken or [place]. Ground chicken or [place]. Pork with fat trimmed off. Fish and seafood. Egg whites. Dried beans, peas, or lentils. Unsalted nuts, nut butters, and seeds. Unsalted canned beans. Lean cuts of beef with fat trimmed off. Low-sodium, lean deli meat. Dairy Low-fat (1%) or fat-free (skim) milk. Fat-free, low-fat, or reduced-fat cheeses. Nonfat, low-sodium ricotta or cottage cheese. Low-fat or nonfat yogurt. Low-fat, low-sodium cheese. Fats and oils Soft margarine without trans fats. Vegetable oil. Low-fat, reduced-fat, or light mayonnaise and salad dressings (reduced-sodium). Canola, safflower, olive, soybean, and  sunflower oils. Avocado. Seasoning and other foods Herbs. Spices. Seasoning mixes without salt. Unsalted popcorn and pretzels. Fat-free sweets. What foods are not recommended? The items listed may not be a complete list. Talk with your dietitian about what dietary choices are best for you. Grains Baked goods made with fat, such as croissants, muffins, or some breads. Dry pasta or rice meal packs. Vegetables Creamed or fried vegetables. Vegetables in a cheese sauce. Regular canned vegetables (not low-sodium or reduced-sodium). Regular canned tomato sauce and paste (not low-sodium or reduced-sodium). Regular tomato and vegetable juice (not low-sodium or reduced-sodium). Angie Fava. Olives. Fruits Canned fruit in a light or heavy syrup. Fried fruit. Fruit in cream or butter sauce. Meat and other protein foods Fatty cuts of meat. Ribs. Fried meat. [name]. Sausage. Bologna and other processed lunch meats. Salami. Fatback. Hotdogs. Bratwurst. Salted nuts and seeds. Canned beans with added salt. Canned or smoked fish. Whole eggs or egg yolks. Chicken or [place] with skin. Dairy  Whole or 2% milk, cream, and half-and-half. Whole or full-fat cream cheese. Whole-fat or sweetened yogurt. Full-fat cheese. Nondairy creamers. Whipped toppings. Processed cheese and cheese spreads. Fats and oils Butter. Stick margarine. Lard. Shortening. Ghee. Bacon fat. Tropical oils, such as coconut, palm kernel, or palm oil. Seasoning and other foods Salted popcorn and pretzels. Onion salt, garlic salt, seasoned salt, table salt, and sea salt. Worcestershire sauce. Tartar sauce. Barbecue sauce. Teriyaki sauce. Soy sauce, including reduced-sodium. Steak sauce. Canned and packaged gravies. Fish sauce. Oyster sauce. Cocktail sauce. Horseradish that you find on the shelf. Ketchup. Mustard. Meat flavorings and tenderizers. Bouillon cubes. Hot sauce and Tabasco sauce. Premade or packaged marinades. Premade or packaged taco seasonings.  Relishes. Regular salad dressings. Where to find more information:  National Heart, Lung, and [place]: https://wilson-eaton.com/  American Heart Association: www.heart.org Summary  The DASH eating plan is a healthy eating plan that has been shown to reduce high blood pressure (hypertension). It may also reduce your risk for type 2 diabetes, heart disease, and stroke.  With the DASH eating plan, you should limit salt (sodium) intake to 2,300 mg a day. If you have hypertension, you may need to reduce your sodium intake to 1,500 mg a day.  When on the DASH eating plan, aim to eat more fresh fruits and vegetables, whole grains, lean proteins, low-fat dairy, and heart-healthy fats.  Work with your health care provider or diet and nutrition specialist (dietitian) to adjust your eating plan to your individual calorie needs. This information is not intended to replace advice given to you by your health care provider. Make sure you discuss any questions you have with your health care provider. Document Released: 01/13/2011 Document Revised: 01/18/2016 Document Reviewed: 01/18/2016 Elsevier Interactive Patient Education  2017 [place]. Insomnia Insomnia is a sleep disorder that makes it difficult to fall asleep or to stay asleep. Insomnia can cause tiredness (fatigue), low energy, difficulty concentrating, mood swings, and poor performance at work or school. There are three different ways to classify insomnia:  Difficulty falling asleep.  Difficulty staying asleep.  Waking up too early in the morning.  Any type of insomnia can be long-term (chronic) or short-term (acute). Both are common. Short-term insomnia usually lasts for three months or less. Chronic insomnia occurs at least three times a week for longer than three months. What are the causes? Insomnia may be caused by another condition, situation, or substance, such as:  Anxiety.  Certain medicines.  Gastroesophageal reflux  disease (GERD) or other gastrointestinal conditions.  Asthma or other breathing conditions.  Restless legs syndrome, sleep apnea, or other sleep disorders.  Chronic pain.  Menopause. This may include hot flashes.  Stroke.  Abuse of alcohol, tobacco, or illegal drugs.  Depression.  Caffeine.  Neurological disorders, such as Alzheimer disease.  An overactive thyroid (hyperthyroidism).  The cause of insomnia may not be known. What increases the risk? Risk factors for insomnia include:  Gender. Women are more commonly affected than men.  Age. Insomnia is more common as you get older.  Stress. This may involve your professional or personal life.  Income. Insomnia is more common in people with lower income.  Lack of exercise.  Irregular work schedule or night shifts.  Traveling between different time zones.  What are the signs or symptoms? If you have insomnia, trouble falling asleep or trouble staying asleep is the main symptom. This may lead to other symptoms, such as:  Feeling fatigued.  Feeling nervous about going to sleep.  Not feeling rested in the morning.  Having trouble concentrating.  Feeling irritable, anxious, or depressed.  How is this treated? Treatment for insomnia depends on the cause. If your insomnia is caused by an underlying condition, treatment will focus on addressing the condition. Treatment may also include:  Medicines to help you sleep.  Counseling or therapy.  Lifestyle adjustments.  Follow these instructions at home:  Take medicines only as directed by your health care provider.  Keep regular sleeping and waking hours. Avoid naps.  Keep a sleep diary to help you and your health care provider figure out what could be causing your insomnia. Include: ? When you sleep. ? When you wake up during the night. ? How well you sleep. ? How rested you feel the next day. ? Any side effects of medicines you are taking. ? What you eat  and drink.  Make your bedroom a comfortable place where it is easy to fall asleep: ? Put up shades or special blackout curtains to block light from outside. ? Use a white noise machine to block noise. ? Keep the temperature cool.  Exercise regularly as directed by your health care provider. Avoid exercising right before bedtime.  Use relaxation techniques to manage stress. Ask your health care provider to suggest some techniques that may work well for you. These may include: ? Breathing exercises. ? Routines to release muscle tension. ? Visualizing peaceful scenes.  Cut back on alcohol, caffeinated beverages, and cigarettes, especially close to bedtime. These can disrupt your sleep.  Do not overeat or eat spicy foods right before bedtime. This can lead to digestive discomfort that can make it hard for you to sleep.  Limit screen use before bedtime. This includes: ? Watching TV. ? Using your smartphone, tablet, and computer.  Stick to a routine. This can help you fall asleep faster. Try to do a quiet activity, brush your teeth, and go to bed at the same time each night.  Get out of bed if you are still awake after 15 minutes of trying to sleep. Keep the lights down, but try reading or doing a quiet activity. When you feel sleepy, go back to bed.  Make sure that you drive carefully. Avoid driving if you feel very sleepy.  Keep all follow-up appointments as directed by your health care provider. This is important. Contact a health care provider if:  You are tired throughout the day or have trouble in your daily routine due to sleepiness.  You continue to have sleep problems or your sleep problems get worse. Get help right away if:  You have serious thoughts about hurting yourself or someone else. This information is not intended to replace advice given to you by your health care provider. Make sure you discuss any questions you have with your health care provider. Document  Released: 01/22/2000 Document Revised: 06/26/2015 Document Reviewed: 10/25/2013 Elsevier Interactive Patient Education  [employer].

## 2016-07-18 DIAGNOSIS — H25813 Combined forms of age-related cataract, bilateral: Secondary | ICD-10-CM | POA: Diagnosis not present

## 2016-07-18 DIAGNOSIS — H04123 Dry eye syndrome of bilateral lacrimal glands: Secondary | ICD-10-CM | POA: Diagnosis not present

## 2016-07-18 DIAGNOSIS — H25811 Combined forms of age-related cataract, right eye: Secondary | ICD-10-CM | POA: Diagnosis not present

## 2016-07-18 DIAGNOSIS — H25812 Combined forms of age-related cataract, left eye: Secondary | ICD-10-CM | POA: Diagnosis not present

## 2016-07-18 DIAGNOSIS — H40013 Open angle with borderline findings, low risk, bilateral: Secondary | ICD-10-CM | POA: Diagnosis not present

## 2016-07-18 DIAGNOSIS — E119 Type 2 diabetes mellitus without complications: Secondary | ICD-10-CM | POA: Diagnosis not present

## 2016-09-28 ENCOUNTER — Ambulatory Visit: Payer: Medicare HMO | Admitting: Internal Medicine

## 2016-10-17 DIAGNOSIS — C61 Malignant neoplasm of prostate: Secondary | ICD-10-CM | POA: Diagnosis not present

## 2016-10-17 DIAGNOSIS — R350 Frequency of micturition: Secondary | ICD-10-CM | POA: Diagnosis not present

## 2016-10-19 ENCOUNTER — Ambulatory Visit (INDEPENDENT_AMBULATORY_CARE_PROVIDER_SITE_OTHER): Payer: Medicare HMO | Admitting: Internal Medicine

## 2016-10-19 ENCOUNTER — Other Ambulatory Visit (INDEPENDENT_AMBULATORY_CARE_PROVIDER_SITE_OTHER): Payer: Medicare HMO

## 2016-10-19 ENCOUNTER — Other Ambulatory Visit: Payer: Self-pay | Admitting: Internal Medicine

## 2016-10-19 ENCOUNTER — Encounter: Payer: Self-pay | Admitting: Internal Medicine

## 2016-10-19 VITALS — BP 122/82 | HR 85 | Temp 98.4°F | Ht 71.0 in | Wt 189.0 lb

## 2016-10-19 DIAGNOSIS — R030 Elevated blood-pressure reading, without diagnosis of hypertension: Secondary | ICD-10-CM | POA: Diagnosis not present

## 2016-10-19 DIAGNOSIS — Z0000 Encounter for general adult medical examination without abnormal findings: Secondary | ICD-10-CM

## 2016-10-19 DIAGNOSIS — E785 Hyperlipidemia, unspecified: Secondary | ICD-10-CM

## 2016-10-19 DIAGNOSIS — E119 Type 2 diabetes mellitus without complications: Secondary | ICD-10-CM

## 2016-10-19 LAB — BASIC METABOLIC PANEL
BUN: 19 mg/dL (ref 6–23)
CALCIUM: 9.7 mg/dL (ref 8.4–10.5)
CO2: 28 mEq/L (ref 19–32)
Chloride: 102 mEq/L (ref 96–112)
Creatinine, Ser: 1.24 mg/dL (ref 0.40–1.50)
GFR: 73.66 mL/min (ref >60.00)
Glucose, Bld: 140 mg/dL — ABNORMAL HIGH (ref 70–99)
Potassium: 4.2 mEq/L (ref 3.5–5.1)
SODIUM: 139 meq/L (ref 135–145)

## 2016-10-19 LAB — LIPID PANEL
CHOL/HDL RATIO: 3
Cholesterol: 171 mg/dL (ref 0–200)
HDL: 64.3 mg/dL (ref >39.00)
LDL CALC: 70 mg/dL (ref 0–99)
NonHDL: 106.37
TRIGLYCERIDES: 184 mg/dL — AB (ref 0.0–149.0)
VLDL: 36.8 mg/dL (ref 0.0–40.0)

## 2016-10-19 LAB — HEPATIC FUNCTION PANEL
ALBUMIN: 4.2 g/dL (ref 3.5–5.2)
ALT: 7 U/L (ref 0–53)
AST: 17 U/L (ref 0–37)
Alkaline Phosphatase: 52 U/L (ref 39–117)
BILIRUBIN TOTAL: 0.4 mg/dL (ref 0.2–1.2)
Bilirubin, Direct: 0 mg/dL (ref 0.0–0.3)
Total Protein: 7.4 g/dL (ref 6.0–8.3)

## 2016-10-19 LAB — HEMOGLOBIN A1C: Hgb A1c MFr Bld: 7.3 % — ABNORMAL HIGH (ref 4.6–6.5)

## 2016-10-19 MED ORDER — GLIPIZIDE ER 2.5 MG PO TB24: 2.5000 mg | ORAL_TABLET | Freq: Every day | ORAL | 3 refills | Status: DC

## 2016-10-19 NOTE — Patient Instructions (Signed)

## 2016-10-19 NOTE — Progress Notes (Signed)
Subjective:    Patient ID: Samuel Krueger, male    DOB: 02/05/1946, 71 y.o.   MRN: [id number]  HPI  Here to f/u; overall doing ok,  Pt denies chest pain, increasing sob or doe, wheezing, orthopnea, PND, increased LE swelling, palpitations, dizziness or syncope.  Pt denies new neurological symptoms such as new headache, or facial or extremity weakness but has had occasional numbness to the upper left lateral leg. No LBP or hip pain.  Pt denies polydipsia, polyuria, or low sugar episode.  Pt states overall good compliance with meds, mostly trying to follow appropriate diet, with wt overall stable. Not with regular exercise.   Did not take the [place] after reading possible side effects  Has been working much more on gym activity and diet, but wt no change. Wt Readings from Last 3 Encounters:  10/19/16 189 lb (85.7 kg)  07/15/16 189 lb (85.7 kg)  04/28/16 188 lb (85.3 kg)  Has also just seen urology 3 days ago - good psa and exam per pt.  Past Medical History:  Diagnosis Date  . Hyperlipidemia   . Nocturia   . Prostate cancer [facility]) urologist-- dr Janice Norrie   active survellance since dx Feb 2014--  now has Stage T1c, Gleason (3+4)7, PSA 7.75  . Recovering alcoholic in remission ([place])    since rehab 20 yrs ago-- approx 1995  . S/P radiation therapy 07/15/14   Radioactive Seed Implant- Prostate/proximal seminal vesicles, 14,500 cGy utilizing 74 I-125 seeds implanted in 30 needles with an individual seed activity 0.658 mCi per seed for a total implant activity of 48.6.millicuries.  . Tinnitus of both ears   . Type 2 diabetes, diet controlled ([place])    Past Surgical History:  Procedure Laterality Date  . PROSTATE BIOPSY  03/26/14  . RADIOACTIVE SEED IMPLANT N/A 07/15/2014   Procedure: RADIOACTIVE SEED IMPLANT/BRACHYTHERAPY IMPLANT;  Surgeon: Lowella Bandy, MD;  [facility]: [facility];  Service: Urology;  Laterality: N/A;    reports that he quit smoking about 16 years ago. His smoking use  included Cigarettes. He quit after 0.80 years of use. He has never used smokeless tobacco. He reports that he does not drink alcohol or use drugs. family history includes Alcohol abuse in his mother; HIV/AIDS in his brother; Heart disease in his mother; Hypertension in his mother; Mental illness in his other. Allergies  Allergen Reactions  . Metformin And Related Other (See Comments)    Chest tightness  . Shrimp [Shellfish Allergy] Other (See Comments)    "strange GI reaction"   Current Outpatient Prescriptions on File Prior to Visit  Medication Sig Dispense Refill  . aspirin EC 81 MG tablet Take 1 tablet (81 mg total) by mouth daily. 90 tablet 11   No current facility-administered medications on file prior to visit.    Review of Systems  Constitutional: Negative for other unusual diaphoresis or sweats HENT: Negative for ear discharge or swelling Eyes: Negative for other worsening visual disturbances Respiratory: Negative for stridor or other swelling  Gastrointestinal: Negative for worsening distension or other blood Genitourinary: Negative for retention or other urinary change Musculoskeletal: Negative for other MSK pain or swelling Skin: Negative for color change or other new lesions Neurological: Negative for worsening tremors and other numbness  Psychiatric/Behavioral: Negative for worsening agitation or other fatigue All other system neg per pt    Objective:   Physical Exam BP 122/82   Pulse 85   Temp 98.4 F (36.9 C) (Oral)   Ht  5\' 11"  (1.803 m)   Wt 189 lb (85.7 kg)   SpO2 99%   BMI 26.36 kg/m  VS noted,  Constitutional: Pt appears in NAD HENT: Head: NCAT.  Right Ear: External ear normal.  Left Ear: External ear normal.  Eyes: . Pupils are equal, round, and reactive to light. Conjunctivae and EOM are normal Nose: without d/c or deformity Neck: Neck supple. Gross normal ROM Cardiovascular: Normal rate and regular rhythm.   Pulmonary/Chest: Effort normal and  breath sounds without rales or wheezing.  Abd:  Soft, NT, ND, + BS, no organomegaly Neurological: Pt is alert. At baseline orientation, motor grossly intact Skin: Skin is warm. No rashes, other new lesions, no LE edema Psychiatric: Pt behavior is normal without agitation  No other exam findings Lab Results  Component Value Date   WBC 7.1 03/30/2016   HGB 13.2 03/30/2016   HCT 40.0 03/30/2016   PLT 179.0 03/30/2016   GLUCOSE 140 (H) 10/19/2016   CHOL 171 10/19/2016   TRIG 184.0 (H) 10/19/2016   HDL 64.30 10/19/2016   LDLCALC 70 10/19/2016   ALT 7 10/19/2016   AST 17 10/19/2016   NA 139 10/19/2016   K 4.2 10/19/2016   CL 102 10/19/2016   CREATININE 1.24 10/19/2016   BUN 19 10/19/2016   CO2 28 10/19/2016   TSH 0.46 03/30/2016   PSA 0.90 03/30/2016   INR 0.99 07/08/2014   HGBA1C 7.3 (H) 10/19/2016   MICROALBUR <0.7 03/30/2016      Assessment & Plan:

## 2016-10-20 ENCOUNTER — Telehealth: Payer: Self-pay

## 2016-10-20 NOTE — Telephone Encounter (Signed)
Called pt, LVM.   

## 2016-10-20 NOTE — Telephone Encounter (Signed)
-----   Message from Biagio Borg, MD sent at 10/19/2016  6:22 PM EDT ----- Left message on MyChart, pt to cont same tx except  The test results show that your current treatment is OK, except the A1c is still mildly too high.  Please start a very low dose of a medication called glipizide ER 2.5 mg per day, with the toal A1c being less than 7.    Ingra Rother to please inform pt, I will do rx

## 2016-10-22 NOTE — Assessment & Plan Note (Signed)
stable overall by history and exam, recent data reviewed with pt, and pt to continue medical treatment as before,  to f/u any worsening symptoms or concerns, consider glipizide ER 2.5 for a1c > 7

## 2016-10-22 NOTE — Assessment & Plan Note (Signed)
Lab Results  Component Value Date   LDLCALC 70 10/19/2016  stable overall by history and exam, recent data reviewed with pt, and pt to continue medical treatment as before,  to f/u any worsening symptoms or concerns

## 2016-10-22 NOTE — Assessment & Plan Note (Signed)
Improved, stable overall by history and exam, recent data reviewed with pt, and pt to continue medical treatment as before,  to f/u any worsening symptoms or concerns  

## 2017-04-19 ENCOUNTER — Ambulatory Visit (INDEPENDENT_AMBULATORY_CARE_PROVIDER_SITE_OTHER): Payer: Medicare HMO | Admitting: Internal Medicine

## 2017-04-19 ENCOUNTER — Encounter: Payer: Self-pay | Admitting: Internal Medicine

## 2017-04-19 ENCOUNTER — Other Ambulatory Visit (INDEPENDENT_AMBULATORY_CARE_PROVIDER_SITE_OTHER): Payer: Medicare HMO

## 2017-04-19 VITALS — BP 120/86 | HR 84 | Temp 97.9°F | Ht 71.0 in | Wt 184.0 lb

## 2017-04-19 DIAGNOSIS — E119 Type 2 diabetes mellitus without complications: Secondary | ICD-10-CM | POA: Diagnosis not present

## 2017-04-19 DIAGNOSIS — Z0000 Encounter for general adult medical examination without abnormal findings: Secondary | ICD-10-CM | POA: Diagnosis not present

## 2017-04-19 DIAGNOSIS — Z23 Encounter for immunization: Secondary | ICD-10-CM | POA: Diagnosis not present

## 2017-04-19 DIAGNOSIS — C61 Malignant neoplasm of prostate: Secondary | ICD-10-CM

## 2017-04-19 LAB — LIPID PANEL
CHOLESTEROL: 178 mg/dL (ref 0–200)
HDL: 64.8 mg/dL (ref >39.00)
LDL Cholesterol: 99 mg/dL (ref 0–99)
NonHDL: 113.08
Total CHOL/HDL Ratio: 3
Triglycerides: 72 mg/dL (ref 0.0–149.0)
VLDL: 14.4 mg/dL (ref 0.0–40.0)

## 2017-04-19 LAB — CBC WITH DIFFERENTIAL/PLATELET
BASOS ABS: 0.1 10*3/uL (ref 0.0–0.1)
Basophils Relative: 0.9 % (ref 0.0–3.0)
EOS PCT: 3.7 % (ref 0.0–5.0)
Eosinophils Absolute: 0.3 10*3/uL (ref 0.0–0.7)
HCT: 43.7 % (ref 39.0–52.0)
Hemoglobin: 14.2 g/dL (ref 13.0–17.0)
Lymphocytes Relative: 14.7 % (ref 12.0–46.0)
Lymphs Abs: 1 10*3/uL (ref 0.7–4.0)
MCHC: 32.6 g/dL (ref 30.0–36.0)
MCV: 80.5 fl (ref 78.0–100.0)
MONO ABS: 0.5 10*3/uL (ref 0.1–1.0)
Monocytes Relative: 6.5 % (ref 3.0–12.0)
NEUTROS ABS: 5.2 10*3/uL (ref 1.4–7.7)
Neutrophils Relative %: 74.2 % (ref 43.0–77.0)
Platelets: 186 10*3/uL (ref 150.0–400.0)
RBC: 5.43 Mil/uL (ref 4.22–5.81)
RDW: 14.6 % (ref 11.5–15.5)
WBC: 7 10*3/uL (ref 4.0–10.5)

## 2017-04-19 LAB — MICROALBUMIN / CREATININE URINE RATIO
CREATININE, U: 36.2 mg/dL
MICROALB/CREAT RATIO: 1.9 mg/g (ref 0.0–30.0)

## 2017-04-19 LAB — BASIC METABOLIC PANEL
BUN: 16 mg/dL (ref 6–23)
CO2: 31 mEq/L (ref 19–32)
CREATININE: 0.98 mg/dL (ref 0.40–1.50)
Calcium: 9.8 mg/dL (ref 8.4–10.5)
Chloride: 103 mEq/L (ref 96–112)
GFR: 96.5 mL/min (ref >60.00)
Glucose, Bld: 97 mg/dL (ref 70–99)
Potassium: 4.3 mEq/L (ref 3.5–5.1)
Sodium: 139 mEq/L (ref 135–145)

## 2017-04-19 LAB — TSH: TSH: 0.45 u[IU]/mL (ref 0.35–4.50)

## 2017-04-19 LAB — HEPATIC FUNCTION PANEL
ALBUMIN: 4.3 g/dL (ref 3.5–5.2)
ALK PHOS: 48 U/L (ref 39–117)
ALT: 10 U/L (ref 0–53)
AST: 21 U/L (ref 0–37)
Bilirubin, Direct: 0.1 mg/dL (ref 0.0–0.3)
Total Bilirubin: 0.4 mg/dL (ref 0.2–1.2)
Total Protein: 7.4 g/dL (ref 6.0–8.3)

## 2017-04-19 LAB — URINALYSIS, ROUTINE W REFLEX MICROSCOPIC
BILIRUBIN URINE: NEGATIVE
HGB URINE DIPSTICK: NEGATIVE
Ketones, ur: NEGATIVE
LEUKOCYTES UA: NEGATIVE
Nitrite: NEGATIVE
RBC / HPF: NONE SEEN (ref 0–?)
Specific Gravity, Urine: <=1.005 — AB (ref 1.000–1.030)
Total Protein, Urine: NEGATIVE
UROBILINOGEN UA: 0.2 (ref 0.0–1.0)
Urine Glucose: NEGATIVE
WBC, UA: NONE SEEN (ref 0–?)
pH: 6.5 (ref 5.0–8.0)

## 2017-04-19 LAB — PSA: PSA: 0.41 ng/mL (ref 0.10–4.00)

## 2017-04-19 LAB — HEMOGLOBIN A1C: HEMOGLOBIN A1C: 7.3 % — AB (ref 4.6–6.5)

## 2017-04-19 NOTE — Patient Instructions (Addendum)
You will be contacted regarding the referral for: colonoscopy  You had the Prevnar pneumonia shot today  Please continue all other medications as before, and refills have been done if requested.  Please have the pharmacy call with any other refills you may need.  Please continue your efforts at being more active, low cholesterol diet, and weight control.  You are otherwise up to date with prevention measures today.  Please keep your appointments with your specialists as you may have planned  Please go to the LAB in the Basement (turn left off the elevator) for the tests to be done today  You will be contacted by phone if any changes need to be made immediately.  Otherwise, you will receive a letter about your results with an explanation, but please check with MyChart first.  Please remember to sign up for MyChart if you have not done so, as this will be important to you in the future with finding out test results, communicating by private email, and scheduling acute appointments online when needed.  Please return in 6 months, or sooner if needed, with Lab testing done 3-5 days before

## 2017-04-19 NOTE — Progress Notes (Signed)
Subjective:    Patient ID: Samuel Krueger, male    DOB: 07-Jul-1945, 72 y.o.   MRN: [id number]  HPI  Here for wellness and f/u;  Overall doing ok;  Pt denies Chest pain, worsening SOB, DOE, wheezing, orthopnea, PND, worsening LE edema, palpitations, dizziness or syncope.  Pt denies neurological change such as new headache, facial or extremity weakness.  Pt denies polydipsia, polyuria, or low sugar symptoms. Pt states overall good compliance with treatment and medications, good tolerability, and has been trying to follow appropriate diet.  Pt denies worsening depressive symptoms, suicidal ideation or panic. No fever, night sweats, wt loss, loss of appetite, or other constitutional symptoms.  Pt states good ability with ADL's, has low fall risk, home safety reviewed and adequate, no other significant changes in hearing or vision, and only occasionally active with exercise. Did not start glipizide due to wariness after eading side effects.  Plans to rescheudle eye exam, has  bilat cataract, may need surgury soon.  Due for colonoscopy. Due for prevnar.  Keeps up with his 11 yo girlfriend ok. Past Medical History:  Diagnosis Date  . Hyperlipidemia   . Nocturia   . Prostate cancer [facility]) urologist-- dr Janice Norrie   active survellance since dx Feb 2014--  now has Stage T1c, Gleason (3+4)7, PSA 7.75  . Recovering alcoholic in remission ([place])    since rehab 20 yrs ago-- approx 1995  . S/P radiation therapy 07/15/14   Radioactive Seed Implant- Prostate/proximal seminal vesicles, 14,500 cGy utilizing 74 I-125 seeds implanted in 30 needles with an individual seed activity 0.658 mCi per seed for a total implant activity of 48.6.millicuries.  . Tinnitus of both ears   . Type 2 diabetes, diet controlled ([place])    Past Surgical History:  Procedure Laterality Date  . PROSTATE BIOPSY  03/26/14  . RADIOACTIVE SEED IMPLANT N/A 07/15/2014   Procedure: RADIOACTIVE SEED IMPLANT/BRACHYTHERAPY IMPLANT;  Surgeon: Lowella Bandy, MD;   Location: [facility];  Service: Urology;  Laterality: N/A;    reports that he quit smoking about 16 years ago. His smoking use included cigarettes. He quit after 0.80 years of use. he has never used smokeless tobacco. He reports that he does not drink alcohol or use drugs. family history includes Alcohol abuse in his mother; HIV/AIDS in his brother; Heart disease in his mother; Hypertension in his mother; Mental illness in his other. Allergies  Allergen Reactions  . Metformin And Related Other (See Comments)    Chest tightness  . Shrimp [Shellfish Allergy] Other (See Comments)    "strange GI reaction"   Current Outpatient Medications on File Prior to Visit  Medication Sig Dispense Refill  . aspirin EC 81 MG tablet Take 1 tablet (81 mg total) by mouth daily. 90 tablet 11   No current facility-administered medications on file prior to visit.    Review of Systems Constitutional: Negative for other unusual diaphoresis, sweats, appetite or weight changes HENT: Negative for other worsening hearing loss, ear pain, facial swelling, mouth sores or neck stiffness.   Eyes: Negative for other worsening pain, redness or other visual disturbance.  Respiratory: Negative for other stridor or swelling Cardiovascular: Negative for other palpitations or other chest pain  Gastrointestinal: Negative for worsening diarrhea or loose stools, blood in stool, distention or other pain Genitourinary: Negative for hematuria, flank pain or other change in urine volume.  Musculoskeletal: Negative for myalgias or other joint swelling.  Skin: Negative for other color change, or other wound or  worsening drainage.  Neurological: Negative for other syncope or numbness. Hematological: Negative for other adenopathy or swelling Psychiatric/Behavioral: Negative for hallucinations, other worsening agitation, SI, self-injury, or new decreased concentration All other system neg per pt    Objective:   Physical  Exam BP 120/86   Pulse 84   Temp 97.9 F (36.6 C) (Oral)   Ht 5\' 11"  (1.803 m)   Wt 184 lb (83.5 kg)   SpO2 96%   BMI 25.66 kg/m  VS noted,  Constitutional: Pt is oriented to person, place, and time. Appears well-developed and well-nourished, in no significant distress and comfortable Head: Normocephalic and atraumatic  Eyes: Conjunctivae and EOM are normal. Pupils are equal, round, and reactive to light Right Ear: External ear normal without discharge Left Ear: External ear normal without discharge Nose: Nose without discharge or deformity Mouth/Throat: Oropharynx is without other ulcerations and moist  Neck: Normal range of motion. Neck supple. No JVD present. No tracheal deviation present or significant neck LA or mass Cardiovascular: Normal rate, regular rhythm, normal heart sounds and intact distal pulses. Pulmonary/Chest: WOB normal and breath sounds without rales or wheezing  Abdominal: Soft. Bowel sounds are normal. NT. No HSM  Musculoskeletal: Normal range of motion. Exhibits no edema Lymphadenopathy: Has no other cervical adenopathy.  Neurological: Pt is alert and oriented to person, place, and time. Pt has normal reflexes. No cranial nerve deficit. Motor grossly intact, Gait intact Skin: Skin is warm and dry. No rash noted or new ulcerations Psychiatric:  Has normal mood and affect. Behavior is normal without agitation No other exam findings  Lab Results  Component Value Date   WBC 7.1 03/30/2016   HGB 13.2 03/30/2016   HCT 40.0 03/30/2016   PLT 179.0 03/30/2016   GLUCOSE 140 (H) 10/19/2016   CHOL 171 10/19/2016   TRIG 184.0 (H) 10/19/2016   HDL 64.30 10/19/2016   LDLCALC 70 10/19/2016   ALT 7 10/19/2016   AST 17 10/19/2016   NA 139 10/19/2016   K 4.2 10/19/2016   CL 102 10/19/2016   CREATININE 1.24 10/19/2016   BUN 19 10/19/2016   CO2 28 10/19/2016   TSH 0.46 03/30/2016   PSA 0.90 03/30/2016   INR 0.99 07/08/2014   HGBA1C 7.3 (H) 10/19/2016   MICROALBUR  <0.7 03/30/2016         Assessment & Plan:

## 2017-04-19 NOTE — Addendum Note (Signed)
Addended by: Juliet Rude on: 04/19/2017 02:44 PM   Modules accepted: Orders

## 2017-04-19 NOTE — Assessment & Plan Note (Signed)

## 2017-04-19 NOTE — Assessment & Plan Note (Signed)
Also for psa with labs

## 2017-04-19 NOTE — Assessment & Plan Note (Signed)
Mild elev last visit, did not take glipizide, for recheck today, consider add metformin ER 500

## 2017-04-21 ENCOUNTER — Other Ambulatory Visit: Payer: Self-pay | Admitting: Internal Medicine

## 2017-04-21 MED ORDER — EMPAGLIFLOZIN 10 MG PO TABS: 10.0000 mg | ORAL_TABLET | Freq: Every day | ORAL | 3 refills | Status: DC

## 2017-05-30 ENCOUNTER — Encounter: Payer: Self-pay | Admitting: Internal Medicine

## 2018-03-30 ENCOUNTER — Other Ambulatory Visit: Payer: Self-pay

## 2018-03-30 NOTE — Patient Outreach (Signed)
  [facility]) Care Management Chronic Special Needs Program   03/30/2018  Name: Samuel Krueger, DOB: 06-12-1945  MRN: [id number]  The client was discussed in 03/23/18's interdisciplinary care team meeting. The client's individualized care plan was developed based on completed Health Risk Assessment.   The following issues were discussed:  Key risk triggers/risk stratification and Care Plan  Participants present:   Mahlon Gammon, MSN, RN, CCM, CNS    Thea Silversmith, MSN, RN, CCM   Melissa Sandlin RN,BSN,CCM, CDE       Recommendations:  Send Advanced Directives packet. Diabetes education  Plan:  Plan to send copy of individualized care plan to client Plan to send individualized care plan to provider. Chronic Care Management Coordinator will outreach in 2-4 months  Follow-up:  2-4 months  Peter Garter RN, Jackquline Denmark, CDE Chronic Care Management Coordinator [place] [facility] [phone]

## 2018-07-12 ENCOUNTER — Other Ambulatory Visit: Payer: Self-pay

## 2018-07-12 ENCOUNTER — Encounter: Payer: Self-pay | Admitting: Internal Medicine

## 2018-07-12 ENCOUNTER — Ambulatory Visit (INDEPENDENT_AMBULATORY_CARE_PROVIDER_SITE_OTHER): Payer: HMO | Admitting: Internal Medicine

## 2018-07-12 VITALS — BP 122/82 | HR 82 | Temp 97.9°F | Ht 71.0 in | Wt 190.0 lb

## 2018-07-12 DIAGNOSIS — Z0000 Encounter for general adult medical examination without abnormal findings: Secondary | ICD-10-CM

## 2018-07-12 DIAGNOSIS — R109 Unspecified abdominal pain: Secondary | ICD-10-CM | POA: Insufficient documentation

## 2018-07-12 DIAGNOSIS — E559 Vitamin D deficiency, unspecified: Secondary | ICD-10-CM | POA: Diagnosis not present

## 2018-07-12 DIAGNOSIS — E611 Iron deficiency: Secondary | ICD-10-CM | POA: Diagnosis not present

## 2018-07-12 DIAGNOSIS — E538 Deficiency of other specified B group vitamins: Secondary | ICD-10-CM

## 2018-07-12 DIAGNOSIS — E119 Type 2 diabetes mellitus without complications: Secondary | ICD-10-CM

## 2018-07-12 DIAGNOSIS — Z23 Encounter for immunization: Secondary | ICD-10-CM

## 2018-07-12 MED ORDER — POLYETHYLENE GLYCOL 3350 17 GM/SCOOP PO POWD: 17.0000 g | Freq: Two times a day (BID) | ORAL | 1 refills | Status: DC | PRN

## 2018-07-12 NOTE — Assessment & Plan Note (Signed)

## 2018-07-12 NOTE — Assessment & Plan Note (Signed)
stable overall by history and exam, recent data reviewed with pt, and pt to continue medical treatment as before,  to f/u any worsening symptoms or concerns  

## 2018-07-12 NOTE — Progress Notes (Signed)
Subjective:    Patient ID: Samuel Krueger, male    DOB: 08-Dec-1945, 73 y.o.   MRN: [id number]  HPI  Here for wellness and f/u;  Overall doing ok;  Pt denies Chest pain, worsening SOB, DOE, wheezing, orthopnea, PND, worsening LE edema, palpitations, dizziness or syncope.  Pt denies neurological change such as new headache, facial or extremity weakness.  Pt denies polydipsia, polyuria, or low sugar symptoms. Pt states overall good compliance with treatment and medications, good tolerability, and has been trying to follow appropriate diet.  Pt denies worsening depressive symptoms, suicidal ideation or panic. No fever, night sweats, wt loss, loss of appetite, or other constitutional symptoms.  Pt states good ability with ADL's, has low fall risk, home safety reviewed and adequate, no other significant changes in hearing or vision, and only occasionally active with exercise. Plans to call for optho appt for DM check and cataracts. Also c/o 2 onset worsening mild intermitent crampy abd pains with primarily constipation but with one time of small volume loose stool as well; Denies worsening reflux, dysphagia, n/v, or overt blood.  No fever.  Denies urinary symptoms such as dysuria, frequency, urgency, flank pain, hematuria or n/v, fever, chills.  Nothing seems to make better or worse Past Medical History:  Diagnosis Date  . Hyperlipidemia   . Nocturia   . Prostate cancer [facility]) urologist-- dr Janice Norrie   active survellance since dx Feb 2014--  now has Stage T1c, Gleason (3+4)7, PSA 7.75  . Recovering alcoholic in remission ([place])    since rehab 20 yrs ago-- approx 1995  . S/P radiation therapy 07/15/14   Radioactive Seed Implant- Prostate/proximal seminal vesicles, 14,500 cGy utilizing 74 I-125 seeds implanted in 30 needles with an individual seed activity 0.658 mCi per seed for a total implant activity of 48.6.millicuries.  . Tinnitus of both ears   . Type 2 diabetes, diet controlled ([place])    Past Surgical  History:  Procedure Laterality Date  . PROSTATE BIOPSY  03/26/14  . RADIOACTIVE SEED IMPLANT N/A 07/15/2014   Procedure: RADIOACTIVE SEED IMPLANT/BRACHYTHERAPY IMPLANT;  Surgeon: Lowella Bandy, MD;  Location: [facility];  Service: Urology;  Laterality: N/A;    reports that he quit smoking about 18 years ago. His smoking use included cigarettes. He quit after 0.80 years of use. He has never used smokeless tobacco. He reports that he does not drink alcohol or use drugs. family history includes Alcohol abuse in his mother; HIV/AIDS in his brother; Heart disease in his mother; Hypertension in his mother; Mental illness in an other family member. Allergies  Allergen Reactions  . Metformin And Related Other (See Comments)    Chest tightness  . Shrimp [Shellfish Allergy] Other (See Comments)    "strange GI reaction"   Current Outpatient Medications on File Prior to Visit  Medication Sig Dispense Refill  . aspirin EC 81 MG tablet Take 1 tablet (81 mg total) by mouth daily. 90 tablet 11  . empagliflozin (JARDIANCE) 10 MG TABS tablet Take 10 mg by mouth daily. 90 tablet 3   No current facility-administered medications on file prior to visit.    Review of Systems Constitutional: Negative for other unusual diaphoresis, sweats, appetite or weight changes HENT: Negative for other worsening hearing loss, ear pain, facial swelling, mouth sores or neck stiffness.   Eyes: Negative for other worsening pain, redness or other visual disturbance.  Respiratory: Negative for other stridor or swelling Cardiovascular: Negative for other palpitations or other chest pain  Gastrointestinal: Negative for worsening diarrhea or loose stools, blood in stool, distention or other pain Genitourinary: Negative for hematuria, flank pain or other change in urine volume.  Musculoskeletal: Negative for myalgias or other joint swelling.  Skin: Negative for other color change, or other wound or worsening drainage.   Neurological: Negative for other syncope or numbness. Hematological: Negative for other adenopathy or swelling Psychiatric/Behavioral: Negative for hallucinations, other worsening agitation, SI, self-injury, or new decreased concentration All other system neg per pt    Objective:   Physical Exam BP 122/82   Pulse 82   Temp 97.9 F (36.6 C) (Oral)   Ht 5\' 11"  (1.803 m)   Wt 190 lb (86.2 kg)   SpO2 97%   BMI 26.50 kg/m  VS noted, not ill appearing Constitutional: Pt is oriented to person, place, and time. Appears well-developed and well-nourished, in no significant distress and comfortable Head: Normocephalic and atraumatic  Eyes: Conjunctivae and EOM are normal. Pupils are equal, round, and reactive to light Right Ear: External ear normal without discharge Left Ear: External ear normal without discharge Nose: Nose without discharge or deformity Mouth/Throat: Oropharynx is without other ulcerations and moist  Neck: Normal range of motion. Neck supple. No JVD present. No tracheal deviation present or significant neck LA or mass Cardiovascular: Normal rate, regular rhythm, normal heart sounds and intact distal pulses.   Pulmonary/Chest: WOB normal and breath sounds without rales or wheezing  Abdominal: Soft. Bowel sounds are normal. NT. No HSM but is somewhat bloated and distended Musculoskeletal: Normal range of motion. Exhibits no edema Lymphadenopathy: Has no other cervical adenopathy.  Neurological: Pt is alert and oriented to person, place, and time. Pt has normal reflexes. No cranial nerve deficit. Motor grossly intact, Gait intact Skin: Skin is warm and dry. No rash noted or new ulcerations Psychiatric:  Has normal mood and affect. Behavior is normal without agitation No other exam findings Lab Results  Component Value Date   WBC 7.0 04/19/2017   HGB 14.2 04/19/2017   HCT 43.7 04/19/2017   PLT 186.0 04/19/2017   GLUCOSE 97 04/19/2017   CHOL 178 04/19/2017   TRIG 72.0  04/19/2017   HDL 64.80 04/19/2017   LDLCALC 99 04/19/2017   ALT 10 04/19/2017   AST 21 04/19/2017   NA 139 04/19/2017   K 4.3 04/19/2017   CL 103 04/19/2017   CREATININE 0.98 04/19/2017   BUN 16 04/19/2017   CO2 31 04/19/2017   TSH 0.45 04/19/2017   PSA 0.41 04/19/2017   INR 0.99 07/08/2014   HGBA1C 7.3 (H) 04/19/2017   MICROALBUR <0.7 04/19/2017      Assessment & Plan:

## 2018-07-12 NOTE — Patient Instructions (Addendum)
You had the Pneumovax pneumonia shot today  Please take all new medication as prescribed - the miralax for the bowels  Please continue all other medications as before, and refills have been done if requested.  Please have the pharmacy call with any other refills you may need.  Please continue your efforts at being more active, low cholesterol diet, and weight control.  You are otherwise up to date with prevention measures today.  Please keep your appointments with your specialists as you may have planned  Please go to the LAB in the Basement (turn left off the elevator) for the tests to be done today  You will be contacted by phone if any changes need to be made immediately.  Otherwise, you will receive a letter about your results with an explanation, but please check with MyChart first.  Please remember to sign up for MyChart if you have not done so, as this will be important to you in the future with finding out test results, communicating by private email, and scheduling acute appointments online when needed.  Please return in 6 months, or sooner if needed, with Lab testing done 3-5 days before

## 2018-07-12 NOTE — Assessment & Plan Note (Signed)
Suspect likely constipation, for labs as ordered, also miralax trial asd

## 2018-07-19 ENCOUNTER — Other Ambulatory Visit: Payer: Self-pay | Admitting: Internal Medicine

## 2018-07-19 ENCOUNTER — Other Ambulatory Visit (INDEPENDENT_AMBULATORY_CARE_PROVIDER_SITE_OTHER): Payer: HMO

## 2018-07-19 DIAGNOSIS — E559 Vitamin D deficiency, unspecified: Secondary | ICD-10-CM

## 2018-07-19 DIAGNOSIS — E119 Type 2 diabetes mellitus without complications: Secondary | ICD-10-CM | POA: Diagnosis not present

## 2018-07-19 DIAGNOSIS — R109 Unspecified abdominal pain: Secondary | ICD-10-CM | POA: Diagnosis not present

## 2018-07-19 DIAGNOSIS — Z0000 Encounter for general adult medical examination without abnormal findings: Secondary | ICD-10-CM | POA: Diagnosis not present

## 2018-07-19 DIAGNOSIS — E538 Deficiency of other specified B group vitamins: Secondary | ICD-10-CM

## 2018-07-19 DIAGNOSIS — E611 Iron deficiency: Secondary | ICD-10-CM

## 2018-07-19 LAB — CBC WITH DIFFERENTIAL/PLATELET
Basophils Absolute: 0.1 10*3/uL (ref 0.0–0.1)
Basophils Relative: 0.8 % (ref 0.0–3.0)
Eosinophils Absolute: 0.1 10*3/uL (ref 0.0–0.7)
Eosinophils Relative: 1.5 % (ref 0.0–5.0)
HCT: 40.6 % (ref 39.0–52.0)
Hemoglobin: 13.2 g/dL (ref 13.0–17.0)
Lymphocytes Relative: 12.4 % (ref 12.0–46.0)
Lymphs Abs: 1 10*3/uL (ref 0.7–4.0)
MCHC: 32.5 g/dL (ref 30.0–36.0)
MCV: 80.2 fl (ref 78.0–100.0)
Monocytes Absolute: 0.8 10*3/uL (ref 0.1–1.0)
Monocytes Relative: 10 % (ref 3.0–12.0)
Neutro Abs: 6.1 10*3/uL (ref 1.4–7.7)
Neutrophils Relative %: 75.3 % (ref 43.0–77.0)
Platelets: 185 10*3/uL (ref 150.0–400.0)
RBC: 5.06 Mil/uL (ref 4.22–5.81)
RDW: 13.9 % (ref 11.5–15.5)
WBC: 8.1 10*3/uL (ref 4.0–10.5)

## 2018-07-19 LAB — BASIC METABOLIC PANEL
BUN: 12 mg/dL (ref 6–23)
CO2: 29 mEq/L (ref 19–32)
Calcium: 9.4 mg/dL (ref 8.4–10.5)
Chloride: 102 mEq/L (ref 96–112)
Creatinine, Ser: 1.04 mg/dL (ref 0.40–1.50)
GFR: 84.49 mL/min (ref >60.00)
Glucose, Bld: 127 mg/dL — ABNORMAL HIGH (ref 70–99)
Potassium: 4.2 mEq/L (ref 3.5–5.1)
Sodium: 141 mEq/L (ref 135–145)

## 2018-07-19 LAB — URINALYSIS, ROUTINE W REFLEX MICROSCOPIC
Bilirubin Urine: NEGATIVE
Hgb urine dipstick: NEGATIVE
Ketones, ur: NEGATIVE
Leukocytes,Ua: NEGATIVE
Nitrite: NEGATIVE
RBC / HPF: NONE SEEN (ref 0–?)
Specific Gravity, Urine: 1.02 (ref 1.000–1.030)
Total Protein, Urine: NEGATIVE
Urine Glucose: NEGATIVE
Urobilinogen, UA: 0.2 (ref 0.0–1.0)
WBC, UA: NONE SEEN (ref 0–?)
pH: 6 (ref 5.0–8.0)

## 2018-07-19 LAB — MICROALBUMIN / CREATININE URINE RATIO
Creatinine,U: 176.9 mg/dL
Microalb Creat Ratio: 0.7 mg/g (ref 0.0–30.0)
Microalb, Ur: 1.2 mg/dL (ref 0.0–1.9)

## 2018-07-19 LAB — HEPATIC FUNCTION PANEL
ALT: 7 U/L (ref 0–53)
AST: 16 U/L (ref 0–37)
Albumin: 4.2 g/dL (ref 3.5–5.2)
Alkaline Phosphatase: 55 U/L (ref 39–117)
Bilirubin, Direct: 0.1 mg/dL (ref 0.0–0.3)
Total Bilirubin: 0.6 mg/dL (ref 0.2–1.2)
Total Protein: 7.1 g/dL (ref 6.0–8.3)

## 2018-07-19 LAB — LIPID PANEL
Cholesterol: 179 mg/dL (ref 0–200)
HDL: 56.8 mg/dL (ref >39.00)
LDL Cholesterol: 104 mg/dL — ABNORMAL HIGH (ref 0–99)
NonHDL: 122.62
Total CHOL/HDL Ratio: 3
Triglycerides: 94 mg/dL (ref 0.0–149.0)
VLDL: 18.8 mg/dL (ref 0.0–40.0)

## 2018-07-19 LAB — HEMOGLOBIN A1C: Hgb A1c MFr Bld: 7.9 % — ABNORMAL HIGH (ref 4.6–6.5)

## 2018-07-19 LAB — TSH: TSH: 0.44 u[IU]/mL (ref 0.35–4.50)

## 2018-07-19 LAB — IBC PANEL
Iron: 28 ug/dL — ABNORMAL LOW (ref 42–165)
Saturation Ratios: 10.9 % — ABNORMAL LOW (ref 20.0–50.0)
Transferrin: 184 mg/dL — ABNORMAL LOW (ref 212.0–360.0)

## 2018-07-19 LAB — VITAMIN D 25 HYDROXY (VIT D DEFICIENCY, FRACTURES): VITD: 25.71 ng/mL — ABNORMAL LOW (ref 30.00–100.00)

## 2018-07-19 LAB — VITAMIN B12: Vitamin B-12: 771 pg/mL (ref 211–911)

## 2018-07-19 LAB — LIPASE: Lipase: 24 U/L (ref 11.0–59.0)

## 2018-07-19 LAB — PSA: PSA: 0.04 ng/mL — ABNORMAL LOW (ref 0.10–4.00)

## 2018-07-19 MED ORDER — JARDIANCE 25 MG PO TABS: 25.0000 mg | ORAL_TABLET | Freq: Every day | ORAL | 3 refills | Status: DC

## 2018-07-19 MED ORDER — VITAMIN D (ERGOCALCIFEROL) 1.25 MG (50000 UNIT) PO CAPS: 50000.0000 [IU] | ORAL_CAPSULE | ORAL | 0 refills | Status: DC

## 2018-07-20 ENCOUNTER — Telehealth: Payer: Self-pay

## 2018-07-20 NOTE — Telephone Encounter (Signed)
Pt has viewed results via MyChart  

## 2018-07-20 NOTE — Telephone Encounter (Signed)
-----   Message from Biagio Borg, MD sent at 07/19/2018  6:43 PM EDT ----- Left message on MyChart, pt to cont same tx except  The test results show that your current treatment is OK, as the a1c is still high, and the Vitamin D level is low.  We need to: 1)  Increase the jardiance to 25 mg per day 2)  Please take Vitamin D 50000 units weekly for 12 weeks, then plan to change to OTC Vitamin D3 at 2000 units per day, indefinitely.  [place] to please inform pt, I will do rx x 2

## 2018-07-26 ENCOUNTER — Other Ambulatory Visit: Payer: Self-pay

## 2018-07-26 NOTE — Patient Outreach (Signed)
  [place] [facility]) Care Management Chronic Special Needs Program  07/26/2018  Name: Samuel Krueger DOB: 1945/10/07  MRN: [id number]  [name] is enrolled in a chronic special needs plan for Diabetes. Client called with no answer No answer and HIPAA compliant message left. 1st attempt Plan for 2nd outreach call in one week Chronic care management coordinator will attempt outreach in one week.   Peter Garter RN, Jackquline Denmark, CDE Chronic Care Management Coordinator [place] [facility] [phone]

## 2018-08-01 ENCOUNTER — Other Ambulatory Visit: Payer: Self-pay

## 2018-08-01 NOTE — Patient Outreach (Signed)
  [place] [facility]) Care Management Chronic Special Needs Program  08/01/2018  Name: Samuel Krueger DOB: 07-20-1945  MRN: [id number]  Mr. Nayson Traweek is enrolled in a chronic special needs plan for Diabetes. Client called with no answer No answer and HIPAA compliant message left. 2nd attempt Plan for 3rd outreach call in 1-2 weeks Chronic care management coordinator will attempt outreach in 1-2 weeks.   Peter Garter RN, Jackquline Denmark, CDE Chronic Care Management Coordinator [place] [facility] [phone]

## 2018-08-13 ENCOUNTER — Other Ambulatory Visit: Payer: Self-pay

## 2018-08-13 NOTE — Patient Outreach (Signed)
  [place] [facility]) Care Management Chronic Special Needs Program  08/13/2018  Name: Samuel Krueger DOB: [birth year]  MRN: [id number]  Mr. [name] is enrolled in a chronic special needs plan for Diabetes. Client called with no answer No answer and HIPAA compliant message left. Plan to update individualized care plan using available data in 4-5 days if client does not respond to outreach message Chronic care management coordinator will attempt outreach in 3 Months.   Peter Garter RN, Jackquline Denmark, CDE Chronic Care Management Coordinator [place] [facility] [phone]

## 2018-08-14 ENCOUNTER — Other Ambulatory Visit: Payer: Self-pay

## 2018-08-14 NOTE — Patient Outreach (Signed)
[facility]) Care Management Chronic Special Needs Program  08/14/2018  Name: Samuel Krueger DOB: May 09, 1945  MRN: [id number]  [name] is enrolled in a chronic special needs plan for Diabetes. Chronic Care Management Coordinator telephoned client to review health risk assessment and to develop individualized care plan.  Introduced the chronic care management program, importance of client participation, and taking their care plan to all provider appointments and inpatient facilities.  Reviewed the transition of care process and possible referral to community care management.  Subjective: Client states that he stopped taking Jardiance about 3 days ago because he has a rash that he thinks is yeast and it made him go to the bathroom too much.  States his blood sugars have been running 103-130 in the morning and 160-180 after eating.  States he know why his Hemoglobin A1C went up with his last check as he had been eating a few more sweets and he had not been exercising since the [facility] closed due to Covid 19.  States he has been watching his CHO a lot closer now.  Goals Addressed            This Visit's Progress   .  Acknowledge receipt of Advanced Directive package   On track   . Advanced Care Planning complete by next 9 months   On track   . Client understands the importance of follow-up with providers by attending scheduled visits   On track   . Client will use Assistive Devices as needed and verbalize understanding of device use   On track   . Continue to be as healthy as I can possibly be   On track    Continue to exercise and eat as healthy as possible    . HEMOGLOBIN A1C < 7.0       Diabetes self management actions:  Glucose monitoring per provider recommendations  Eat Healthy  Check feet daily  Visit provider every 3-6 months as directed  Hbg A1C level every 3-6 months.  Eye Exam yearly    . Maintain timely refills of diabetic medication as  prescribed within the year .   On track   . COMPLETED: Obtain annual  Lipid Profile, LDL-C       Completed 07/19/18    . Obtain Annual Eye (retinal)  Exam    On track   . COMPLETED: Obtain Annual Foot Exam       Completed 07/12/18    . COMPLETED: Obtain annual screen for micro albuminuria (urine) , nephropathy (kidney problems)       Completed 07/19/18    . Obtain Hemoglobin A1C at least 2 times per year   On track    Checked 07/19/18    . Visit Primary Care Provider or Endocrinologist at least 2 times per year    On track    Client is not meeting diabetes self management goal of hemoglobin A1C of <7% with last reading of 7.9%.  Declines referral to social work for [employer] but would like to receive the forms.  Client has stopped Jardiance due to yeast infection and frequent urination Instructed client to notify MD that he has stopped taking Jardiance and he has a rash that he thinks is yeast. Encouraged to call and schedule his annual eye exam Reviewed CHO portions and the importance of regular exercise to help with glycemic control. Reviewed number for 24 hour nurse Line Discussed COVID19 cause, symptoms, precautions (social distancing, stay at home  order, hand washing), confirmed client knows how to contact provider  Plan:  Send successful outreach letter with a copy of their individualized care plan, Send individual care plan to provider, notify provider of stoppage of Jardiance and Send educational material-Advanced Directives   Chronic care management coordination will outreach in: 4-6 Months     [place], [employer], [place] Management Coordinator [place] [facility] [phone]

## 2019-01-11 ENCOUNTER — Ambulatory Visit: Payer: HMO | Admitting: Internal Medicine

## 2019-01-14 ENCOUNTER — Ambulatory Visit: Payer: Self-pay

## 2019-01-14 ENCOUNTER — Other Ambulatory Visit: Payer: Self-pay

## 2019-01-14 ENCOUNTER — Ambulatory Visit (INDEPENDENT_AMBULATORY_CARE_PROVIDER_SITE_OTHER): Payer: HMO | Admitting: Internal Medicine

## 2019-01-14 ENCOUNTER — Encounter: Payer: Self-pay | Admitting: Internal Medicine

## 2019-01-14 VITALS — BP 120/86 | HR 84 | Temp 98.3°F | Ht 71.0 in | Wt 187.0 lb

## 2019-01-14 DIAGNOSIS — E119 Type 2 diabetes mellitus without complications: Secondary | ICD-10-CM | POA: Diagnosis not present

## 2019-01-14 DIAGNOSIS — Z23 Encounter for immunization: Secondary | ICD-10-CM | POA: Diagnosis not present

## 2019-01-14 DIAGNOSIS — E785 Hyperlipidemia, unspecified: Secondary | ICD-10-CM | POA: Diagnosis not present

## 2019-01-14 DIAGNOSIS — E559 Vitamin D deficiency, unspecified: Secondary | ICD-10-CM

## 2019-01-14 DIAGNOSIS — E611 Iron deficiency: Secondary | ICD-10-CM | POA: Diagnosis not present

## 2019-01-14 DIAGNOSIS — R030 Elevated blood-pressure reading, without diagnosis of hypertension: Secondary | ICD-10-CM

## 2019-01-14 DIAGNOSIS — Z0000 Encounter for general adult medical examination without abnormal findings: Secondary | ICD-10-CM

## 2019-01-14 DIAGNOSIS — E538 Deficiency of other specified B group vitamins: Secondary | ICD-10-CM

## 2019-01-14 MED ORDER — ATORVASTATIN CALCIUM 10 MG PO TABS: 10.0000 mg | ORAL_TABLET | Freq: Every day | ORAL | 3 refills | Status: DC

## 2019-01-14 NOTE — Assessment & Plan Note (Addendum)
stable overall by history and exam, recent data reviewed with pt, and pt to continue medical treatment as before,  to f/u any worsening symptoms or concerns, pt hesitant to take jardiance 25, but will take Half qd

## 2019-01-14 NOTE — Assessment & Plan Note (Signed)
Mild uncontrolled, for lipitor 10 qd,  to f/u any worsening symptoms or concerns

## 2019-01-14 NOTE — Progress Notes (Signed)
Subjective:    Patient ID: Samuel Krueger, male    DOB: 1945/07/02, 73 y.o.   MRN: [id number]  HPI  Here to f/u; overall doing ok,  Pt denies chest pain, increasing sob or doe, wheezing, orthopnea, PND, increased LE swelling, palpitations, dizziness or syncope.  Pt denies new neurological symptoms such as new headache, or facial or extremity weakness or numbness.  Pt denies polydipsia, polyuria, or low sugar episode.  Pt states overall good compliance with meds, mostly trying to follow appropriate diet, with wt overall stable,  but little exercise however.  Did have a low sugar rxn x 1 with not eating with a fever one day, seen at ED, states o/w good compliance with meds.  Due for flu shot Past Medical History:  Diagnosis Date  . Hyperlipidemia   . Nocturia   . Prostate cancer [facility]) urologist-- dr Janice Norrie   active survellance since dx Feb 2014--  now has Stage T1c, Gleason (3+4)7, PSA 7.75  . Recovering alcoholic in remission ([place])    since rehab 20 yrs ago-- approx 1995  . S/P radiation therapy 07/15/14   Radioactive Seed Implant- Prostate/proximal seminal vesicles, 14,500 cGy utilizing 74 I-125 seeds implanted in 30 needles with an individual seed activity 0.658 mCi per seed for a total implant activity of 48.6.millicuries.  . Tinnitus of both ears   . Type 2 diabetes, diet controlled ([place])    Past Surgical History:  Procedure Laterality Date  . PROSTATE BIOPSY  03/26/14  . RADIOACTIVE SEED IMPLANT N/A 07/15/2014   Procedure: RADIOACTIVE SEED IMPLANT/BRACHYTHERAPY IMPLANT;  Surgeon: Lowella Bandy, MD;  Location: [facility];  Service: Urology;  Laterality: N/A;    reports that he quit smoking about 18 years ago. His smoking use included cigarettes. He quit after 0.80 years of use. He has never used smokeless tobacco. He reports that he does not drink alcohol or use drugs. family history includes Alcohol abuse in his mother; HIV/AIDS in his brother; Heart disease in his mother;  Hypertension in his mother; Mental illness in an other family member. Allergies  Allergen Reactions  . Metformin And Related Other (See Comments)    Chest tightness  . Shrimp [Shellfish Allergy] Other (See Comments)    "strange GI reaction"   Current Outpatient Medications on File Prior to Visit  Medication Sig Dispense Refill  . aspirin EC 81 MG tablet Take 1 tablet (81 mg total) by mouth daily. 90 tablet 11  . polyethylene glycol powder (GLYCOLAX/MIRALAX) 17 GM/SCOOP powder Take 17 g by mouth 2 (two) times daily as needed. 3350 g 1  . Vitamin D, Ergocalciferol, (DRISDOL) 1.25 MG (50000 UT) CAPS capsule Take 1 capsule (50,000 Units total) by mouth every 7 (seven) days. 12 capsule 0  . empagliflozin (JARDIANCE) 25 MG TABS tablet Take 25 mg by mouth daily. (Patient not taking: Reported on 01/14/2019) 90 tablet 3   No current facility-administered medications on file prior to visit.    Review of Systems  Constitutional: Negative for other unusual diaphoresis or sweats HENT: Negative for ear discharge or swelling Eyes: Negative for other worsening visual disturbances Respiratory: Negative for stridor or other swelling  Gastrointestinal: Negative for worsening distension or other blood Genitourinary: Negative for retention or other urinary change Musculoskeletal: Negative for other MSK pain or swelling Skin: Negative for color change or other new lesions Neurological: Negative for worsening tremors and other numbness  Psychiatric/Behavioral: Negative for worsening agitation or other fatigue All otherwise neg per pt  Objective:   Physical Exam BP 120/86 (BP Location: Left Arm, Patient Position: Sitting, Cuff Size: Large)   Pulse 84   Temp 98.3 F (36.8 C) (Oral)   Ht 5\' 11"  (1.803 m)   Wt 187 lb (84.8 kg)   SpO2 98%   BMI 26.08 kg/m  VS noted,  Constitutional: Pt appears in NAD HENT: Head: NCAT.  Right Ear: External ear normal.  Left Ear: External ear normal.  Eyes: .  Pupils are equal, round, and reactive to light. Conjunctivae and EOM are normal Nose: without d/c or deformity Neck: Neck supple. Gross normal ROM Cardiovascular: Normal rate and regular rhythm.   Pulmonary/Chest: Effort normal and breath sounds without rales or wheezing.  Abd:  Soft, NT, ND, + BS, no organomegaly Neurological: Pt is alert. At baseline orientation, motor grossly intact Skin: Skin is warm. No rashes, other new lesions, no LE edema Psychiatric: Pt behavior is normal without agitation  All otherwise neg per pt  Lab Results  Component Value Date   WBC 8.1 07/19/2018   HGB 13.2 07/19/2018   HCT 40.6 07/19/2018   PLT 185.0 07/19/2018   GLUCOSE 127 (H) 07/19/2018   CHOL 179 07/19/2018   TRIG 94.0 07/19/2018   HDL 56.80 07/19/2018   LDLCALC 104 (H) 07/19/2018   ALT 7 07/19/2018   AST 16 07/19/2018   NA 141 07/19/2018   K 4.2 07/19/2018   CL 102 07/19/2018   CREATININE 1.04 07/19/2018   BUN 12 07/19/2018   CO2 29 07/19/2018   TSH 0.44 07/19/2018   PSA 0.04 (L) 07/19/2018   INR 0.99 07/08/2014   HGBA1C 7.9 (H) 07/19/2018   MICROALBUR 1.2 07/19/2018      Assessment & Plan:

## 2019-01-14 NOTE — Patient Instructions (Addendum)
You had the flu shot today  OK to take half of the Jardiance  Please take all new medication as prescribed - the lipitor 10 mg per day  OK to take the Vitamin D3 OTC at 2000 units per day  Please continue all other medications as before, and refills have been done if requested.  Please have the pharmacy call with any other refills you may need.  Please continue your efforts at being more active, low cholesterol diet, and weight control..  Please keep your appointments with your specialists as you may have planned  Please return in 6 months, or sooner if needed, with Lab testing done 3-5 days before

## 2019-01-14 NOTE — Assessment & Plan Note (Signed)
stable overall by history and exam, recent data reviewed with pt, and pt to continue medical treatment as before,  to f/u any worsening symptoms or concerns  

## 2019-01-14 NOTE — Assessment & Plan Note (Signed)
For vit d3 2000 units qd

## 2019-01-22 ENCOUNTER — Other Ambulatory Visit: Payer: Self-pay

## 2019-01-22 NOTE — Patient Outreach (Signed)
  [facility]) Care Management Chronic Special Needs Program  01/22/2019  Name: Samuel Krueger DOB: 08-05-1945  MRN: [id number]  Samuel Krueger is enrolled in a chronic special needs plan for Diabetes. Client called with no answer No answer and HIPAA compliant message left. 1st attempt Plan for 2nd outreach call in 1-2 weeks Chronic care management coordinator will attempt outreach in 1-2 weeks.   Peter Garter RN, Jackquline [place], CDE Chronic Care Management Coordinator [facility] [facility] [phone]

## 2019-01-30 ENCOUNTER — Other Ambulatory Visit: Payer: Self-pay

## 2019-01-30 NOTE — Patient Outreach (Signed)
  [facility]) Care Management Chronic Special Needs Program  01/30/2019  Name: Samuel Krueger DOB: 1945-11-28  MRN: [id number]  [name] is enrolled in a chronic special needs plan for Diabetes. Reviewed and updated care plan.  Subjective: Client states that he has started taking his Jardiance 1/2 tablet now since he saw Dr. Jenny Reichmann on 01/14/19.  States he is trying to not eat any cake or cookies now.  States he is taking his cholesterol  pill now also as his doctor told him to take.  States he has not been checking his blood sugars in the last few months as he thinks it is better.   States he will call to schedule his eye exam soon.  States he has the Advanced Directives forms but he has not gotten them done  Goals Addressed            This Visit's Progress   . COMPLETED:  Acknowledge receipt of Advanced Directive package       Received forms    . COMPLETED: Advanced Care Planning complete by next 9 months   Not on track    Has not completed    . COMPLETED: Client understands the importance of follow-up with providers by attending scheduled visits   On track    Keeping scheduled appointments     . Client will use Assistive Devices as needed and verbalize understanding of device use   On track    [employer] approved meters-One Touch, Freestyle or Precision     . HEMOGLOBIN A1C < 7.0        Diabetes self management actions:  Glucose monitoring per provider recommendations  Eat Healthy  Check feet daily  Visit provider every 3-6 months as directed  Hbg A1C level every 3-6 months.  Eye Exam yearly    . COMPLETED: Maintain timely refills of diabetic medication as prescribed within the year .   On track    Refilling medications     . Obtain Annual Eye (retinal)  Exam    Not on track    Call to schedule eye exam    . Obtain Hemoglobin A1C at least 2 times per year   Not on track    Checked 07/19/18    . COMPLETED: Visit Primary Care  Provider or Endocrinologist at least 2 times per year        Primary care provider visits on 07/12/18, 01/14/19    . COMPLETED: Weight (lb) < 200 lb (90.7 kg)       Client is not meeting diabetes self management goal of hemoglobin A1C of <7% with last reading of 7.9% Encouraged to check his CBGs more frequently to help him see how he is controlling his diabetes Reinforced to take medications as his doctor has ordered Reviewed low CHO diet and to continue to avoid concentrated sweets Encouraged to get regular exercise and how exercise helps with glycemic control Encouraged to complete his Advanced Directives  Reviewed number for 24-hour nurse Line Reviewed COVID 19 precautions  Plan:  Send successful outreach letter with a copy of their individualized care plan and Send individual care plan to provider  Chronic care management coordinator will outreach in:  6-9 Months per tier level     Peter Garter RN, Jackquline Denmark, [place] Management Coordinator [place] Management [phone]

## 2019-05-03 ENCOUNTER — Ambulatory Visit: Payer: HMO | Attending: Internal Medicine

## 2019-05-03 DIAGNOSIS — Z23 Encounter for immunization: Secondary | ICD-10-CM

## 2019-05-03 NOTE — Progress Notes (Signed)
   Covid-19 Vaccination Clinic  Name:  Samuel Krueger    MRN: [id number] DOB: August 14, 1945  05/03/2019  [name] was observed post Covid-19 immunization for 15 minutes without incident. He was provided with Vaccine Information Sheet and instruction to access the V-Safe system.   [name] was instructed to call 911 with any severe reactions post vaccine: Marland Kitchen Difficulty breathing  . Swelling of face and throat  . A fast heartbeat  . A bad rash all over body  . Dizziness and weakness   Immunizations Administered    Name Date Dose VIS Date Route   Pfizer COVID-19 Vaccine 05/03/2019  4:44 PM 0.3 mL 01/18/2019 Intramuscular   Manufacturer: [place]   Lot: [id number]   [place]: [id number]

## 2019-05-28 ENCOUNTER — Ambulatory Visit: Payer: HMO | Attending: Internal Medicine

## 2019-05-28 DIAGNOSIS — Z23 Encounter for immunization: Secondary | ICD-10-CM

## 2019-05-28 NOTE — Progress Notes (Signed)
   Covid-19 Vaccination Clinic  Name:  Samuel Krueger    MRN: [id number] DOB: 1946/02/06  05/28/2019  Samuel Krueger was observed post Covid-19 immunization for 15 minutes without incident. He was provided with Vaccine Information Sheet and instruction to access the V-Safe system.   Samuel Krueger was instructed to call 911 with any severe reactions post vaccine: Marland Kitchen Difficulty breathing  . Swelling of face and throat  . A fast heartbeat  . A bad rash all over body  . Dizziness and weakness   Immunizations Administered    Name Date Dose VIS Date Route   Pfizer COVID-19 Vaccine 05/28/2019  4:20 PM 0.3 mL 04/03/2018 Intramuscular   Manufacturer: [place]   Lot: [id number]   [place]: [id number]

## 2019-07-16 ENCOUNTER — Ambulatory Visit: Payer: HMO | Admitting: Internal Medicine

## 2019-07-25 ENCOUNTER — Other Ambulatory Visit: Payer: Self-pay

## 2019-07-25 ENCOUNTER — Encounter: Payer: Self-pay | Admitting: Internal Medicine

## 2019-07-25 ENCOUNTER — Ambulatory Visit (INDEPENDENT_AMBULATORY_CARE_PROVIDER_SITE_OTHER): Payer: HMO | Admitting: Internal Medicine

## 2019-07-25 VITALS — BP 130/82 | HR 90 | Temp 98.2°F | Ht 71.0 in | Wt 185.0 lb

## 2019-07-25 DIAGNOSIS — E538 Deficiency of other specified B group vitamins: Secondary | ICD-10-CM | POA: Diagnosis not present

## 2019-07-25 DIAGNOSIS — K5900 Constipation, unspecified: Secondary | ICD-10-CM

## 2019-07-25 DIAGNOSIS — E119 Type 2 diabetes mellitus without complications: Secondary | ICD-10-CM | POA: Diagnosis not present

## 2019-07-25 DIAGNOSIS — Z0001 Encounter for general adult medical examination with abnormal findings: Secondary | ICD-10-CM

## 2019-07-25 DIAGNOSIS — E559 Vitamin D deficiency, unspecified: Secondary | ICD-10-CM | POA: Diagnosis not present

## 2019-07-25 DIAGNOSIS — C61 Malignant neoplasm of prostate: Secondary | ICD-10-CM

## 2019-07-25 DIAGNOSIS — M5416 Radiculopathy, lumbar region: Secondary | ICD-10-CM | POA: Diagnosis not present

## 2019-07-25 NOTE — Assessment & Plan Note (Addendum)
For mRI ls spine,  to f/u any worsening symptoms or concerns  I spent 31 minutes in addition to time for CPX wellness examination in preparing to see the patient by review of recent labs, imaging and procedures, obtaining and reviewing separately obtained history, communicating with the patient and family or caregiver, ordering medications, tests or procedures, and documenting clinical information in the EHR including the differential Dx, treatment, and any further evaluation and other management of left lumbar radiculopathy, dm, vit d deficiency, constipation, prostate ca

## 2019-07-25 NOTE — Progress Notes (Signed)
Subjective:    Patient ID: Samuel Krueger, male    DOB: [birth year], 74 y.o.   MRN: [id number]  HPI  Here for wellness and f/u;  Overall doing ok;  Pt denies Chest pain, worsening SOB, DOE, wheezing, orthopnea, PND, worsening LE edema, palpitations, dizziness or syncope.  Pt denies neurological change such as new headache, facial or extremity weakness.  Pt denies polydipsia, polyuria, or low sugar symptoms. Pt states overall good compliance with treatment and medications, good tolerability, and has been trying to follow appropriate diet.  Pt denies worsening depressive symptoms, suicidal ideation or panic. No fever, night sweats, wt loss, loss of appetite, or other constitutional symptoms.  Pt states good ability with ADL's, has low fall risk, home safety reviewed and adequate, no other significant changes in hearing or vision, and only occasionally active with exercise.  Denies worsening reflux, abd pain, dysphagia, n/v, bowel change or blood except for ongoing consipation, did not try the miralax yet.  Also Pt continues to have 1 mo new onset constant mild to mod  recurring left LBP without bowel or bladder change, fever, wt loss,  worsening LE weakness, or falls, but has persistent LLE pain and numbness to the foot  Denies urinary symptoms such as dysuria, frequency, urgency, flank pain, hematuria or n/v, fever, chills. Past Medical History:  Diagnosis Date  . Hyperlipidemia   . Nocturia   . Prostate cancer [facility]) urologist-- dr Janice Norrie   active survellance since dx Feb 2014--  now has Stage T1c, Gleason (3+4)7, PSA 7.75  . Recovering alcoholic in remission ([place])    since rehab 20 yrs ago-- approx 1995  . S/P radiation therapy 07/15/14   Radioactive Seed Implant- Prostate/proximal seminal vesicles, 14,500 cGy utilizing 74 I-125 seeds implanted in 30 needles with an individual seed activity 0.658 mCi per seed for a total implant activity of 48.6.millicuries.  . Tinnitus of both ears   . Type 2  diabetes, diet controlled ([place])    Past Surgical History:  Procedure Laterality Date  . PROSTATE BIOPSY  03/26/14  . RADIOACTIVE SEED IMPLANT N/A 07/15/2014   Procedure: RADIOACTIVE SEED IMPLANT/BRACHYTHERAPY IMPLANT;  Surgeon: Lowella Bandy, MD;  Location: [facility];  Service: Urology;  Laterality: N/A;    reports that he quit smoking about 19 years ago. His smoking use included cigarettes. He quit after 0.80 years of use. He has never used smokeless tobacco. He reports that he does not drink alcohol and does not use drugs. family history includes Alcohol abuse in his mother; HIV/AIDS in his brother; Heart disease in his mother; Hypertension in his mother; Mental illness in an other family member. Allergies  Allergen Reactions  . Metformin And Related Other (See Comments)    Chest tightness  . Shrimp [Shellfish Allergy] Other (See Comments)    "strange GI reaction"   Current Outpatient Medications on File Prior to Visit  Medication Sig Dispense Refill  . aspirin EC 81 MG tablet Take 1 tablet (81 mg total) by mouth daily. 90 tablet 11  . atorvastatin (LIPITOR) 10 MG tablet Take 1 tablet (10 mg total) by mouth daily. 90 tablet 3  . empagliflozin (JARDIANCE) 25 MG TABS tablet Take 25 mg by mouth daily. (Patient taking differently: Take 25 mg by mouth daily. Taking 1/2 tablet) 90 tablet 3  . polyethylene glycol powder (GLYCOLAX/MIRALAX) 17 GM/SCOOP powder Take 17 g by mouth 2 (two) times daily as needed. 3350 g 1  . Vitamin D, Ergocalciferol, (DRISDOL) 1.25 MG (  50000 UT) CAPS capsule Take 1 capsule (50,000 Units total) by mouth every 7 (seven) days. 12 capsule 0   No current facility-administered medications on file prior to visit.   Review of Systems All otherwise neg per pt     Objective:   Physical Exam BP 130/82 (BP Location: Left Arm, Patient Position: Sitting, Cuff Size: Large)   Pulse 90   Temp 98.2 F (36.8 C) (Oral)   Ht 5\' 11"  (1.803 m)   Wt 185 lb (83.9 kg)    SpO2 97%   BMI 25.80 kg/m  VS noted,  Constitutional: Pt appears in NAD HENT: Head: NCAT.  Right Ear: External ear normal.  Left Ear: External ear normal.  Eyes: . Pupils are equal, round, and reactive to light. Conjunctivae and EOM are normal Nose: without d/c or deformity Neck: Neck supple. Gross normal ROM Cardiovascular: Normal rate and regular rhythm.   Pulmonary/Chest: Effort normal and breath sounds without rales or wheezing.  Abd:  Soft, NT, ND, + BS, no organomegaly Neurological: Pt is alert. At baseline orientation, motor grossly intact Skin: Skin is warm. No rashes, other new lesions, no LE edema Psychiatric: Pt behavior is normal without agitation  All otherwise neg per pt Lab Results  Component Value Date   WBC 8.1 07/19/2018   HGB 13.2 07/19/2018   HCT 40.6 07/19/2018   PLT 185.0 07/19/2018   GLUCOSE 127 (H) 07/19/2018   CHOL 179 07/19/2018   TRIG 94.0 07/19/2018   HDL 56.80 07/19/2018   LDLCALC 104 (H) 07/19/2018   ALT 7 07/19/2018   AST 16 07/19/2018   NA 141 07/19/2018   K 4.2 07/19/2018   CL 102 07/19/2018   CREATININE 1.04 07/19/2018   BUN 12 07/19/2018   CO2 29 07/19/2018   TSH 0.44 07/19/2018   PSA 0.04 (L) 07/19/2018   INR 0.99 07/08/2014   HGBA1C 7.9 (H) 07/19/2018   MICROALBUR 1.2 07/19/2018      Assessment & Plan:

## 2019-07-25 NOTE — Patient Instructions (Signed)
Ok to take the Miralax you have at home as needed for constipation  Please continue all other medications as before, and refills have been done if requested.  Please have the pharmacy call with any other refills you may need.  Please continue your efforts at being more active, low cholesterol diet, and weight control.  You are otherwise up to date with prevention measures today.  Please keep your appointments with your specialists as you may have planned  You will be contacted regarding the referral for: MRI for the lower back  Please go to the LAB at the blood drawing area for the tests to be done - at the [place] will be contacted by phone if any changes need to be made immediately.  Otherwise, you will receive a letter about your results with an explanation, but please check with MyChart first.  Please remember to sign up for MyChart if you have not done so, as this will be important to you in the future with finding out test results, communicating by private email, and scheduling acute appointments online when needed.  Please make an Appointment to return in 6 months, or sooner if needed

## 2019-07-25 NOTE — Assessment & Plan Note (Signed)
For oral replacement 

## 2019-07-25 NOTE — Assessment & Plan Note (Signed)
[place] for miralax prn,  to f/u any worsening symptoms or concerns

## 2019-07-25 NOTE — Assessment & Plan Note (Signed)
For f/u psa, asymp

## 2019-07-25 NOTE — Assessment & Plan Note (Signed)
stable overall by history and exam, recent data reviewed with pt, and pt to continue medical treatment as before,  to f/u any worsening symptoms or concerns  

## 2019-07-25 NOTE — Assessment & Plan Note (Signed)

## 2019-07-29 ENCOUNTER — Ambulatory Visit: Payer: Self-pay

## 2019-08-02 ENCOUNTER — Other Ambulatory Visit (INDEPENDENT_AMBULATORY_CARE_PROVIDER_SITE_OTHER): Payer: HMO

## 2019-08-02 DIAGNOSIS — E538 Deficiency of other specified B group vitamins: Secondary | ICD-10-CM | POA: Diagnosis not present

## 2019-08-02 DIAGNOSIS — E119 Type 2 diabetes mellitus without complications: Secondary | ICD-10-CM

## 2019-08-02 DIAGNOSIS — Z0001 Encounter for general adult medical examination with abnormal findings: Secondary | ICD-10-CM | POA: Diagnosis not present

## 2019-08-02 DIAGNOSIS — C61 Malignant neoplasm of prostate: Secondary | ICD-10-CM

## 2019-08-02 DIAGNOSIS — E559 Vitamin D deficiency, unspecified: Secondary | ICD-10-CM

## 2019-08-02 LAB — CBC WITH DIFFERENTIAL/PLATELET
Basophils Absolute: 0.1 10*3/uL (ref 0.0–0.1)
Basophils Relative: 1.4 % (ref 0.0–3.0)
Eosinophils Absolute: 0.3 10*3/uL (ref 0.0–0.7)
Eosinophils Relative: 4 % (ref 0.0–5.0)
HCT: 41.2 % (ref 39.0–52.0)
Hemoglobin: 13.8 g/dL (ref 13.0–17.0)
Lymphocytes Relative: 18.8 % (ref 12.0–46.0)
Lymphs Abs: 1.2 10*3/uL (ref 0.7–4.0)
MCHC: 33.5 g/dL (ref 30.0–36.0)
MCV: 81.1 fl (ref 78.0–100.0)
Monocytes Absolute: 0.5 10*3/uL (ref 0.1–1.0)
Monocytes Relative: 7.4 % (ref 3.0–12.0)
Neutro Abs: 4.3 10*3/uL (ref 1.4–7.7)
Neutrophils Relative %: 68.4 % (ref 43.0–77.0)
Platelets: 187 10*3/uL (ref 150.0–400.0)
RBC: 5.08 Mil/uL (ref 4.22–5.81)
RDW: 13.8 % (ref 11.5–15.5)
WBC: 6.3 10*3/uL (ref 4.0–10.5)

## 2019-08-02 LAB — HEPATIC FUNCTION PANEL
ALT: 10 U/L (ref 0–53)
AST: 20 U/L (ref 0–37)
Albumin: 4.4 g/dL (ref 3.5–5.2)
Alkaline Phosphatase: 50 U/L (ref 39–117)
Bilirubin, Direct: 0.1 mg/dL (ref 0.0–0.3)
Total Bilirubin: 0.4 mg/dL (ref 0.2–1.2)
Total Protein: 7 g/dL (ref 6.0–8.3)

## 2019-08-02 LAB — PSA: PSA: 0.03 ng/mL — ABNORMAL LOW (ref 0.10–4.00)

## 2019-08-02 LAB — URINALYSIS, ROUTINE W REFLEX MICROSCOPIC
Bilirubin Urine: NEGATIVE
Hgb urine dipstick: NEGATIVE
Ketones, ur: NEGATIVE
Leukocytes,Ua: NEGATIVE
Nitrite: NEGATIVE
Specific Gravity, Urine: 1.02 (ref 1.000–1.030)
Total Protein, Urine: NEGATIVE
Urine Glucose: NEGATIVE
Urobilinogen, UA: 0.2 (ref 0.0–1.0)
pH: 5.5 (ref 5.0–8.0)

## 2019-08-02 LAB — LIPID PANEL
Cholesterol: 188 mg/dL (ref 0–200)
HDL: 49 mg/dL (ref >39.00)
NonHDL: 138.79
Total CHOL/HDL Ratio: 4
Triglycerides: 271 mg/dL — ABNORMAL HIGH (ref 0.0–149.0)
VLDL: 54.2 mg/dL — ABNORMAL HIGH (ref 0.0–40.0)

## 2019-08-02 LAB — VITAMIN D 25 HYDROXY (VIT D DEFICIENCY, FRACTURES): VITD: 27.71 ng/mL — ABNORMAL LOW (ref 30.00–100.00)

## 2019-08-02 LAB — MICROALBUMIN / CREATININE URINE RATIO
Creatinine,U: 100.3 mg/dL
Microalb Creat Ratio: 0.7 mg/g (ref 0.0–30.0)
Microalb, Ur: <0.7 mg/dL (ref 0.0–1.9)

## 2019-08-02 LAB — HEMOGLOBIN A1C: Hgb A1c MFr Bld: 7.7 % — ABNORMAL HIGH (ref 4.6–6.5)

## 2019-08-02 LAB — TSH: TSH: 0.51 u[IU]/mL (ref 0.35–4.50)

## 2019-08-02 LAB — BASIC METABOLIC PANEL
BUN: 19 mg/dL (ref 6–23)
CO2: 24 mEq/L (ref 19–32)
Calcium: 9.6 mg/dL (ref 8.4–10.5)
Chloride: 103 mEq/L (ref 96–112)
Creatinine, Ser: 1.09 mg/dL (ref 0.40–1.50)
GFR: 79.8 mL/min (ref >60.00)
Glucose, Bld: 173 mg/dL — ABNORMAL HIGH (ref 70–99)
Potassium: 4.2 mEq/L (ref 3.5–5.1)
Sodium: 138 mEq/L (ref 135–145)

## 2019-08-02 LAB — VITAMIN B12: Vitamin B-12: 934 pg/mL — ABNORMAL HIGH (ref 211–911)

## 2019-08-02 LAB — LDL CHOLESTEROL, DIRECT: Direct LDL: 98 mg/dL

## 2019-08-03 ENCOUNTER — Other Ambulatory Visit: Payer: Self-pay | Admitting: Internal Medicine

## 2019-08-03 MED ORDER — VITAMIN D (ERGOCALCIFEROL) 1.25 MG (50000 UNIT) PO CAPS: 50000.0000 [IU] | ORAL_CAPSULE | ORAL | 0 refills | Status: DC

## 2019-08-03 MED ORDER — GLIPIZIDE ER 2.5 MG PO TB24: 2.5000 mg | ORAL_TABLET | Freq: Every day | ORAL | 3 refills | Status: DC

## 2019-08-06 ENCOUNTER — Telehealth: Payer: Self-pay | Admitting: Internal Medicine

## 2019-08-06 ENCOUNTER — Other Ambulatory Visit: Payer: Self-pay

## 2019-08-06 MED ORDER — ONETOUCH VERIO VI STRP: ORAL_STRIP | 12 refills | Status: DC

## 2019-08-06 MED ORDER — ONETOUCH VERIO W/DEVICE KIT: PACK | 0 refills | Status: DC

## 2019-08-06 MED ORDER — LANCETS MISC: 3 refills | Status: AC

## 2019-08-06 NOTE — Telephone Encounter (Signed)
-----   Message from Dimitri Ped, RN sent at 08/06/2019  4:04 PM EDT ----- Regarding: Needs new glucometer Dr.Zamariah Seaborn, I am working with Mr.Funari as his Chronic Special Needs Program care coordinator.  He needs a new glucometer that is covered under his HTA insurance The [employer] approved meters are One Touch, Freestyle or Precision. Please send a RX to his preferred pharmacy for the meter, strips and testing supplies if you want him to self monitor. Thank you for all you do Peter Garter RN, [facility], [place] Management [phone]

## 2019-08-06 NOTE — Patient Outreach (Signed)
[place] [facility]) Care Management Chronic Special Needs Program  08/06/2019  Name: Samuel Krueger DOB: 13-Jun-1945  MRN: [id number]  Mr. Darcel Frane is enrolled in a chronic special needs plan for Diabetes. Chronic Care Management Coordinator telephoned client to review health risk assessment and to develop individualized care plan.  Introduced the chronic care management program, importance of client participation, and taking their care plan to all provider appointments and inpatient facilities.  Reviewed the transition of care process and possible referral to community care management.  Subjective: Client states that he saw his doctor last week for his check up.  States he gave him a new diabetes medication as he has not been taking the [place].  States it made him go to the bathroom too much.  States he has not gone to the drug store yet to keep it up.  States he know when his blood sugars are up and he does not check his blood sugars very often.  States he is trying to cut back on the breads and starched he eats and is eating more fresh vegetables.  States he has not been exercising regularly since the start of COVID but he would like to start walking.  States he has gotten both of his COVID shots.   Goals Addressed              This Visit's Progress     COMPLETED:  Acknowledge receipt of Advanced Directive package   On track     Received forms Sent EMMI: Advanced Directives        Client will use Assistive Devices as needed and verbalize understanding of device use   Not on track     [employer] approved meters-One Touch, Freestyle or Precision  Not checking blood sugars regularly  Messaged provider requesting order for new glucometer  Sent EMMI: Diabetes how to check your blood sugar      Continue to be as healthy as I can possibly be   On track     Continue to exercise and eat as healthy as possible      Diabetes Patient stated goal to walk 1/2-1  mile 5 times a week for the next 12 months (pt-stated)        Reviewed importance of regular exercise to help control blood sugars Call [employer]  at [phone] to enroll      HEMOGLOBIN A1C < 7         Last Hemoglobin A1C 7.7% Instructed on importance of taking diabetes medication as ordered Instructed on use and possible side effects of glipizide  Reviewed fasting blood sugar goals of 80-130 and less than 180 1 1/2-2 hours after meals Reinforced to follow a low carbohydrate low salt diet and to watch portion sizes Reviewed signs and symptoms of hypoglycemia and actions to take       Maintain timely refills of diabetic medication as prescribed within the year .   Not on track     Reinforced importance of getting medications refilled on time Instructed to go to pharmacy to pick up new prescription for glipizide today if possible       COMPLETED: Obtain annual  Lipid Profile, LDL-C   On track     Completed 08/02/19 LDL 98 The goal for LDL is less than 70 mg/dL as you are at high risk for complications Try to avoid saturated fats, trans-fats and eat more fiber Plan to take statin as ordered  Goal completed 08/06/19  Obtain Annual Eye (retinal)  Exam    Not on track     Call to schedule eye exam Reinforced importance of having annual dilated eye exam      COMPLETED: Obtain Annual Foot Exam   On track     Completed 07/25/19 Check your skin and feet every day for cuts, bruises, redness, blisters, or sores. Report to provider any problems with your feet Schedule a foot exam with your health care provider once every year Goal completed 08/06/19      COMPLETED: Obtain annual screen for micro albuminuria (urine) , nephropathy (kidney problems)   On track     Completed 08/02/19 It is important for your doctor to check your urine for protein at least every year Goal completed 08/06/19      Obtain Hemoglobin A1C at least 2 times per year   On track     Completed 08/06/19 It  is important to have your Hemoglobin A1C checked every 6 months if you are at goal and every 3 months if you are not at goal      Visit Primary Care Provider or Endocrinologist at least 2 times per year    On track     Primary care provider visits on 07/25/19 Please schedule your annual wellness visit       Plan:  Send successful outreach letter with a copy of their individualized care plan, Send individual care plan to provider and Send educational [place], Diabetes how to check your blood sugar  Client will be outreached by a [employer] (HT[employer]) RNCM in 12 months per tier level     [place], Jackquline Denmark, [place] Management [phone]

## 2019-08-07 ENCOUNTER — Other Ambulatory Visit: Payer: Self-pay

## 2019-08-07 NOTE — Patient Outreach (Signed)
  [place]) Care Management Chronic Special Needs Program    08/07/2019  Name: Samuel Krueger, DOB: 08-16-1945  MRN: [id number]   Mr. Bernhard Koskinen is enrolled in a chronic special needs plan for Diabetes. Spoke with client and HIPAA veriifed Called client to let him know that a RX for a new covered glucometer and strips was sent to his pharmacy by Dr.John yesterday.  Instructed he can go pick up  Client states he will go pick it up and his other prescriptions sometime this week.   Reviewed blood sugar goals fasting and postprandial  Client will be outreached by a [employer] ([employer]) RNCM in 12 months per tier level  [place], [facility], [place] Management [phone]

## 2019-09-04 ENCOUNTER — Other Ambulatory Visit: Payer: Self-pay

## 2019-09-04 ENCOUNTER — Ambulatory Visit
Admission: RE | Admit: 2019-09-04 | Discharge: 2019-09-04 | Disposition: A | Discharge status: Home / Self Care | Payer: HMO | Source: Ambulatory Visit | Attending: Internal Medicine | Admitting: Internal Medicine

## 2019-09-04 DIAGNOSIS — M5416 Radiculopathy, lumbar region: Secondary | ICD-10-CM

## 2019-09-04 DIAGNOSIS — M48061 Spinal stenosis, lumbar region without neurogenic claudication: Secondary | ICD-10-CM | POA: Diagnosis not present

## 2019-09-05 ENCOUNTER — Other Ambulatory Visit: Payer: Self-pay | Admitting: Internal Medicine

## 2019-09-05 ENCOUNTER — Encounter: Payer: Self-pay | Admitting: Internal Medicine

## 2019-09-05 DIAGNOSIS — M5416 Radiculopathy, lumbar region: Secondary | ICD-10-CM

## 2019-11-07 ENCOUNTER — Encounter: Payer: Self-pay | Admitting: Internal Medicine

## 2019-12-17 ENCOUNTER — Other Ambulatory Visit: Payer: Self-pay

## 2019-12-17 NOTE — Patient Outreach (Signed)
  [place] [facility]) Care Management Chronic Special Needs Program    12/17/2019  Name: Samuel Krueger, DOB: December 06, 1945  MRN: [id number]   [name] is enrolled in a chronic special needs plan for Diabetes.  [facility] will continue to provide services for this client through 02/07/2020. The [employer] care management team will assume care 02/08/2020.  Peter Garter RN, Jackquline Denmark, CDE Chronic Care Management Coordinator [place] [facility] Management [phone]

## 2020-01-28 ENCOUNTER — Telehealth: Payer: Self-pay | Admitting: Internal Medicine

## 2020-01-28 ENCOUNTER — Encounter: Payer: Self-pay | Admitting: Internal Medicine

## 2020-01-28 DIAGNOSIS — E119 Type 2 diabetes mellitus without complications: Secondary | ICD-10-CM

## 2020-01-28 DIAGNOSIS — H269 Unspecified cataract: Secondary | ICD-10-CM

## 2020-01-28 NOTE — Telephone Encounter (Signed)
Patient calling wondering if he can get a referral to ophthalmology, having issues with cataracts and wanted to also get a diabetic eye exam.  [phone]

## 2020-01-28 NOTE — Telephone Encounter (Signed)
Done referral

## 2020-01-28 NOTE — Telephone Encounter (Signed)
Sent to Dr. John. 

## 2020-02-14 ENCOUNTER — Other Ambulatory Visit: Payer: Self-pay

## 2020-02-24 ENCOUNTER — Telehealth: Payer: Self-pay

## 2020-02-24 MED ORDER — ATORVASTATIN CALCIUM 10 MG PO TABS
10.0000 mg | ORAL_TABLET | Freq: Every day | ORAL | 1 refills | Status: DC
Start: 2020-02-24 — End: 2020-11-22

## 2020-02-24 NOTE — Telephone Encounter (Signed)
Request from [facility] to close a potential gap in therapy. Request to continue Atorvastatin 10mg  Last OV 07/25/19 labs 08/02/19 Prescription expired 01/14/20

## 2020-02-24 NOTE — Telephone Encounter (Signed)
Ok this is done 

## 2020-02-24 NOTE — Addendum Note (Signed)
Addended by: Biagio Borg on: 02/24/2020 06:09 PM   Modules accepted: Orders

## 2020-07-23 ENCOUNTER — Other Ambulatory Visit: Payer: Self-pay

## 2020-07-23 ENCOUNTER — Encounter: Payer: Self-pay | Admitting: Internal Medicine

## 2020-07-23 ENCOUNTER — Ambulatory Visit (INDEPENDENT_AMBULATORY_CARE_PROVIDER_SITE_OTHER): Payer: HMO | Admitting: Internal Medicine

## 2020-07-23 VITALS — BP 140/82 | HR 63 | Ht 71.0 in | Wt 180.0 lb

## 2020-07-23 DIAGNOSIS — E7800 Pure hypercholesterolemia, unspecified: Secondary | ICD-10-CM

## 2020-07-23 DIAGNOSIS — R079 Chest pain, unspecified: Secondary | ICD-10-CM

## 2020-07-23 DIAGNOSIS — R195 Other fecal abnormalities: Secondary | ICD-10-CM | POA: Insufficient documentation

## 2020-07-23 DIAGNOSIS — M5416 Radiculopathy, lumbar region: Secondary | ICD-10-CM

## 2020-07-23 DIAGNOSIS — E119 Type 2 diabetes mellitus without complications: Secondary | ICD-10-CM | POA: Diagnosis not present

## 2020-07-23 DIAGNOSIS — Z0001 Encounter for general adult medical examination with abnormal findings: Secondary | ICD-10-CM | POA: Diagnosis not present

## 2020-07-23 DIAGNOSIS — R030 Elevated blood-pressure reading, without diagnosis of hypertension: Secondary | ICD-10-CM | POA: Diagnosis not present

## 2020-07-23 DIAGNOSIS — N529 Male erectile dysfunction, unspecified: Secondary | ICD-10-CM | POA: Insufficient documentation

## 2020-07-23 DIAGNOSIS — E538 Deficiency of other specified B group vitamins: Secondary | ICD-10-CM | POA: Diagnosis not present

## 2020-07-23 DIAGNOSIS — I499 Cardiac arrhythmia, unspecified: Secondary | ICD-10-CM

## 2020-07-23 DIAGNOSIS — N400 Enlarged prostate without lower urinary tract symptoms: Secondary | ICD-10-CM | POA: Insufficient documentation

## 2020-07-23 DIAGNOSIS — H269 Unspecified cataract: Secondary | ICD-10-CM | POA: Diagnosis not present

## 2020-07-23 DIAGNOSIS — C61 Malignant neoplasm of prostate: Secondary | ICD-10-CM | POA: Insufficient documentation

## 2020-07-23 DIAGNOSIS — E559 Vitamin D deficiency, unspecified: Secondary | ICD-10-CM | POA: Diagnosis not present

## 2020-07-23 DIAGNOSIS — Z0000 Encounter for general adult medical examination without abnormal findings: Secondary | ICD-10-CM

## 2020-07-23 DIAGNOSIS — Z1211 Encounter for screening for malignant neoplasm of colon: Secondary | ICD-10-CM

## 2020-07-23 LAB — LIPID PANEL
Cholesterol: 168 mg/dL (ref 0–200)
HDL: 48.3 mg/dL (ref >39.00)
LDL Cholesterol: 94 mg/dL (ref 0–99)
NonHDL: 119.54
Total CHOL/HDL Ratio: 3
Triglycerides: 130 mg/dL (ref 0.0–149.0)
VLDL: 26 mg/dL (ref 0.0–40.0)

## 2020-07-23 LAB — CBC WITH DIFFERENTIAL/PLATELET
Basophils Absolute: 0 10*3/uL (ref 0.0–0.1)
Basophils Relative: 1.1 % (ref 0.0–3.0)
Eosinophils Absolute: 0.1 10*3/uL (ref 0.0–0.7)
Eosinophils Relative: 2.7 % (ref 0.0–5.0)
HCT: 42 % (ref 39.0–52.0)
Hemoglobin: 13.8 g/dL (ref 13.0–17.0)
Lymphocytes Relative: 23.3 % (ref 12.0–46.0)
Lymphs Abs: 1 10*3/uL (ref 0.7–4.0)
MCHC: 32.8 g/dL (ref 30.0–36.0)
MCV: 79.9 fl (ref 78.0–100.0)
Monocytes Absolute: 0.5 10*3/uL (ref 0.1–1.0)
Monocytes Relative: 11.6 % (ref 3.0–12.0)
Neutro Abs: 2.5 10*3/uL (ref 1.4–7.7)
Neutrophils Relative %: 61.3 % (ref 43.0–77.0)
Platelets: 161 10*3/uL (ref 150.0–400.0)
RBC: 5.26 Mil/uL (ref 4.22–5.81)
RDW: 13.5 % (ref 11.5–15.5)
WBC: 4.1 10*3/uL (ref 4.0–10.5)

## 2020-07-23 LAB — BASIC METABOLIC PANEL
BUN: 13 mg/dL (ref 6–23)
CO2: 33 mEq/L — ABNORMAL HIGH (ref 19–32)
Calcium: 9.2 mg/dL (ref 8.4–10.5)
Chloride: 102 mEq/L (ref 96–112)
Creatinine, Ser: 0.98 mg/dL (ref 0.40–1.50)
GFR: 75.19 mL/min (ref >60.00)
Glucose, Bld: 119 mg/dL — ABNORMAL HIGH (ref 70–99)
Potassium: 4.3 mEq/L (ref 3.5–5.1)
Sodium: 140 mEq/L (ref 135–145)

## 2020-07-23 LAB — URINALYSIS, ROUTINE W REFLEX MICROSCOPIC
Bilirubin Urine: NEGATIVE
Hgb urine dipstick: NEGATIVE
Ketones, ur: NEGATIVE
Leukocytes,Ua: NEGATIVE
Nitrite: NEGATIVE
RBC / HPF: NONE SEEN (ref 0–?)
Specific Gravity, Urine: 1.02 (ref 1.000–1.030)
Total Protein, Urine: NEGATIVE
Urine Glucose: NEGATIVE
Urobilinogen, UA: 1 (ref 0.0–1.0)
WBC, UA: NONE SEEN (ref 0–?)
pH: 7.5 (ref 5.0–8.0)

## 2020-07-23 LAB — MAGNESIUM: Magnesium: 2 mg/dL (ref 1.5–2.5)

## 2020-07-23 LAB — HEPATIC FUNCTION PANEL
ALT: 10 U/L (ref 0–53)
AST: 24 U/L (ref 0–37)
Albumin: 4.2 g/dL (ref 3.5–5.2)
Alkaline Phosphatase: 42 U/L (ref 39–117)
Bilirubin, Direct: 0.1 mg/dL (ref 0.0–0.3)
Total Bilirubin: 0.7 mg/dL (ref 0.2–1.2)
Total Protein: 7.2 g/dL (ref 6.0–8.3)

## 2020-07-23 LAB — TSH: TSH: 0.28 u[IU]/mL — ABNORMAL LOW (ref 0.35–4.50)

## 2020-07-23 LAB — PROTIME-INR
INR: 1 ratio (ref 0.8–1.0)
Prothrombin Time: 11.2 s (ref 9.6–13.1)

## 2020-07-23 LAB — VITAMIN B12: Vitamin B-12: 1046 pg/mL — ABNORMAL HIGH (ref 211–911)

## 2020-07-23 LAB — BRAIN NATRIURETIC PEPTIDE: Pro B Natriuretic peptide (BNP): 18 pg/mL (ref 0.0–100.0)

## 2020-07-23 LAB — HEMOGLOBIN A1C: Hgb A1c MFr Bld: 7.1 % — ABNORMAL HIGH (ref 4.6–6.5)

## 2020-07-23 LAB — MICROALBUMIN / CREATININE URINE RATIO
Creatinine,U: 172 mg/dL
Microalb Creat Ratio: 0.8 mg/g (ref 0.0–30.0)
Microalb, Ur: 1.3 mg/dL (ref 0.0–1.9)

## 2020-07-23 LAB — VITAMIN D 25 HYDROXY (VIT D DEFICIENCY, FRACTURES): VITD: 43.14 ng/mL (ref 30.00–100.00)

## 2020-07-23 LAB — PSA: PSA: 0 ng/mL — ABNORMAL LOW (ref 0.10–4.00)

## 2020-07-23 NOTE — Patient Instructions (Addendum)
Your EKG was ok today  Please continue all other medications as before, and refills have been done if requested.  Please have the pharmacy call with any other refills you may need.  Please continue your efforts at being more active, low cholesterol diet, and weight control.  You are otherwise up to date with prevention measures today.  Please keep your appointments with your specialists as you may have planned  You will be contacted regarding the referral for: cardiology, neurosurgury, ophthalmology (eye doctor), echocardiogram, and cardiac even monitor  Please go to the LAB at the blood drawing area for the tests to be done  You will be contacted by phone if any changes need to be made immediately.  Otherwise, you will receive a letter about your results with an explanation, but please check with MyChart first.  Please remember to sign up for MyChart if you have not done so, as this will be important to you in the future with finding out test results, communicating by private email, and scheduling acute appointments online when needed.  Please make an Appointment to return in 3 months, or sooner if needed

## 2020-07-23 NOTE — Progress Notes (Addendum)
Patient ID: Samuel Krueger, male   DOB: [birth year], 75 y.o.   MRN: [id number]         Chief Complaint:: wellness exam and Stool Color Change  Dark stools, chest pain, irreg heart beat, dm       HPI:  Samuel Krueger is a 75 y.o. male here for wellness exam; due for yearly eye exam, colonoscopy o/w up to date with preventive referrals and immunizations.                          Also concerned about recent darker stools though not black or BRB.  Has had several recent sharp fleeting chest pains to the anterior chest mild intermittent in the past month without radiation, sob, diaphoresis, n/v or dizziness, but has had several palpitations.   Pt denies polydipsia, polyuria, or new focal neuro s/s.  Also, c/o Pt continues to have recurring lumbar LBP without bowel or bladder change, fever, wt loss,  but worsening LE pain/numbness/weakness mild, no falls.  Denies worsening reflux, abd pain, dysphagia, n/v,   Wt Readings from Last 3 Encounters:  07/23/20 180 lb (81.6 kg)  07/25/19 185 lb (83.9 kg)  01/14/19 187 lb (84.8 kg)   BP Readings from Last 3 Encounters:  07/23/20 140/82  07/25/19 130/82  01/14/19 120/86   Immunization History  Administered Date(s) Administered   Fluad Quad(high Dose 65+) 01/14/2019   PFIZER(Purple Top)SARS-COV-2 Vaccination 05/03/2019, 05/28/2019   Pneumococcal Conjugate-13 04/19/2017   Pneumococcal Polysaccharide-23 07/12/2018   Td (Adult) 09/24/2013   Tdap 09/24/2013, 03/26/2015   Health Maintenance Due  Topic Date Due   OPHTHALMOLOGY EXAM  Never done   COLONOSCOPY (Pts 45-51yr Insurance coverage will need to be confirmed)  12/29/2012      Past Medical History:  Diagnosis Date   Hyperlipidemia    Nocturia    Prostate cancer ([facility] urologist-- dr Samuel Krueger  active survellance since dx Feb 2014--  now has Stage T1c, Gleason (3+4)7, PSA 7.75   Recovering alcoholic in remission (Samuel Krueger    since rehab 20 yrs ago-- approx 1995   S/P radiation therapy 07/15/14    Radioactive Seed Implant- Prostate/proximal seminal vesicles, 14,500 cGy utilizing 74 I-125 seeds implanted in 30 needles with an individual seed activity 0.658 mCi per seed for a total implant activity of 48.6.millicuries.   Tinnitus of both ears    Type 2 diabetes, diet controlled (Samuel Krueger    Past Surgical History:  Procedure Laterality Date   PROSTATE BIOPSY  03/26/14   RADIOACTIVE SEED IMPLANT N/A 07/15/2014   Procedure: RADIOACTIVE SEED IMPLANT/BRACHYTHERAPY IMPLANT;  Surgeon: Samuel Bandy MD;  [facility]  Service: Urology;  Laterality: N/A;    reports that he quit smoking about 20 years ago. His smoking use included cigarettes. He has never used smokeless tobacco. He reports that he does not drink alcohol and does not use drugs. family history includes Alcohol abuse in his mother; HIV/AIDS in his brother; Heart disease in his mother; Hypertension in his mother; Mental illness in an other family member. Allergies  Allergen Reactions   Metformin And Related Other (See Comments)    Chest tightness   Shrimp [Shellfish Allergy] Other (See Comments)    "strange GI reaction"   Current Outpatient Medications on File Prior to Visit  Medication Sig Dispense Refill   aspirin EC 81 MG tablet Take 1 tablet (81 mg total) by mouth daily. 90 tablet 11   atorvastatin (LIPITOR)  10 MG tablet Take 1 tablet (10 mg total) by mouth daily. 90 tablet 1   Blood Glucose Monitoring Suppl (ONETOUCH VERIO) w/Device KIT Use as directed twice daily E11.9 1 kit 0   empagliflozin (JARDIANCE) 25 MG TABS tablet Take 25 mg by mouth daily. 90 tablet 3   glipiZIDE (GLUCOTROL XL) 2.5 MG 24 hr tablet Take 1 tablet (2.5 mg total) by mouth daily with breakfast. 90 tablet 3   glucose blood (ONETOUCH VERIO) test strip Use as instructed twice daily E11.9 200 each 12   Lancets MISC Use as directed twice daily E11.9 200 each 3   polyethylene glycol powder (GLYCOLAX/MIRALAX) 17 GM/SCOOP powder Take 17 g by mouth  2 (two) times daily as needed. 3350 g 1   Vitamin D, Ergocalciferol, (DRISDOL) 1.25 MG (50000 UNIT) CAPS capsule Take 1 capsule (50,000 Units total) by mouth every 7 (seven) days. 12 capsule 0   No current facility-administered medications on file prior to visit.        ROS:  All others reviewed and negative.  Objective        PE:  BP 140/82 (BP Location: Left Arm, Patient Position: Sitting, Cuff Size: Normal)   Pulse 63   Ht 5' 11"  (1.803 Krueger)   Wt 180 lb (81.6 kg)   SpO2 98%   BMI 25.10 kg/Krueger                 Constitutional: Pt appears in NAD               HENT: Head: NCAT.                Right Ear: External ear normal.                 Left Ear: External ear normal.                Eyes: . Pupils are equal, round, and reactive to light. Conjunctivae and EOM are normal               Nose: without d/c or deformity               Neck: Neck supple. Gross normal ROM               Cardiovascular: Normal rate and regular rhythm.                 Pulmonary/Chest: Effort normal and breath sounds without rales or wheezing.                Abd:  Soft, NT, ND, + BS, no organomegaly               Neurological: Pt is alert. At baseline orientation, motor grossly intact               Skin: Skin is warm. No rashes, no other new lesions, LE edema - none               Psychiatric: Pt behavior is normal without agitation   Micro: none  Cardiac tracings I have personally interpreted today:  ECG - sinus bradycardia 56  Pertinent Radiological findings (summarize): none   Lab Results  Component Value Date   WBC 4.1 07/23/2020   HGB 13.8 07/23/2020   HCT 42.0 07/23/2020   PLT 161.0 07/23/2020   GLUCOSE 119 (H) 07/23/2020   CHOL 168 07/23/2020   TRIG 130.0 07/23/2020   HDL 48.30 07/23/2020   LDLDIRECT 98.0 08/02/2019  LDLCALC 94 07/23/2020   ALT 10 07/23/2020   AST 24 07/23/2020   NA 140 07/23/2020   K 4.3 07/23/2020   CL 102 07/23/2020   CREATININE 0.98 07/23/2020   BUN 13 07/23/2020    CO2 33 (H) 07/23/2020   TSH 0.28 (L) 07/23/2020   PSA 0.00 Repeated and verified X2. (L) 07/23/2020   INR 1.0 07/23/2020   HGBA1C 7.1 (H) 07/23/2020   MICROALBUR 1.3 07/23/2020   Assessment/Plan:  Samuel MCKINNEY is a 74 y.o. Black or African American [2] male with  has a past medical history of Hyperlipidemia, Nocturia, Prostate cancer ([place]) (urologist-- dr Janice Krueger), Recovering alcoholic in remission [facility]), S/P radiation therapy (07/15/14), Tinnitus of both ears, and Type 2 diabetes, diet controlled ([place]).  Preventative health care Age and sex appropriate education and counseling updated with regular exercise and diet Referrals for preventative services - for eye exam and colnoscopy referrals Immunizations addressed - none needed Smoking counseling  - none needed Evidence for depression or other mood disorder - none significant Most recent labs reviewed. I have personally reviewed and have noted: 1) the patient's medical and social history 2) The patient's current medications and supplements 3) The patient's height, weight, and BMI have been recorded in the chart   Elevated blood pressure reading without diagnosis of hypertension BP Readings from Last 3 Encounters:  07/23/20 140/82  07/25/19 130/82  01/14/19 120/86   Stable, pt to continue medical treatment  - diet   Type 2 diabetes, diet controlled ([place]) Lab Results  Component Value Date   HGBA1C 7.1 (H) 07/23/2020   Stable, pt to continue current medical treatment jardiacne, glucotrol   Hyperlipidemia Lab Results  Component Value Date   LDLCALC 94 07/23/2020   Stable, pt to continue current statin lipitor   Vitamin D deficiency Last vitamin D Lab Results  Component Value Date   VD25OH 43.14 07/23/2020   Stable, cont oral replacement   Left lumbar radiculopathy For referral NS  Irregular heart beat For cardiac event monitor, echo, cardiology referral  Dark stools Also for inr and cbc with  labs  Followup: Return in about 6 months (around 01/22/2021).  Cathlean Cower, MD 07/26/2020 9:59 PM [facility]

## 2020-07-26 ENCOUNTER — Encounter: Payer: Self-pay | Admitting: Internal Medicine

## 2020-07-26 NOTE — Assessment & Plan Note (Signed)
For referral NS

## 2020-07-26 NOTE — Assessment & Plan Note (Signed)
Lab Results  Component Value Date   LDLCALC 94 07/23/2020   Stable, pt to continue current statin lipitor

## 2020-07-26 NOTE — Assessment & Plan Note (Signed)
For cardiac event monitor, echo, cardiology referral

## 2020-07-26 NOTE — Assessment & Plan Note (Signed)
BP Readings from Last 3 Encounters:  07/23/20 140/82  07/25/19 130/82  01/14/19 120/86   Stable, pt to continue medical treatment  - diet

## 2020-07-26 NOTE — Assessment & Plan Note (Signed)
Lab Results  Component Value Date   HGBA1C 7.1 (H) 07/23/2020   Stable, pt to continue current medical treatment jardiacne, glucotrol

## 2020-07-26 NOTE — Assessment & Plan Note (Signed)
Last vitamin D Lab Results  Component Value Date   VD25OH 43.14 07/23/2020   Stable, cont oral replacement

## 2020-07-26 NOTE — Assessment & Plan Note (Signed)
Age and sex appropriate education and counseling updated with regular exercise and diet Referrals for preventative services - for eye exam and colnoscopy referrals Immunizations addressed - none needed Smoking counseling  - none needed Evidence for depression or other mood disorder - none significant Most recent labs reviewed. I have personally reviewed and have noted: 1) the patient's medical and social history 2) The patient's current medications and supplements 3) The patient's height, weight, and BMI have been recorded in the chart

## 2020-07-26 NOTE — Assessment & Plan Note (Signed)
Also for inr and cbc with labs

## 2020-08-12 ENCOUNTER — Other Ambulatory Visit: Payer: Self-pay | Admitting: Internal Medicine

## 2020-08-12 NOTE — Telephone Encounter (Signed)
Please refill as per office routine med refill policy (all routine meds refilled for 3 mo or monthly per pt preference up to one year from last visit, then month to month grace period for 3 mo, then further med refills will have to be denied)  

## 2020-08-13 ENCOUNTER — Other Ambulatory Visit: Payer: Self-pay | Admitting: Internal Medicine

## 2020-08-21 ENCOUNTER — Other Ambulatory Visit: Payer: Self-pay

## 2020-08-21 ENCOUNTER — Ambulatory Visit (HOSPITAL_COMMUNITY): Payer: HMO | Attending: Internal Medicine

## 2020-08-21 DIAGNOSIS — R079 Chest pain, unspecified: Secondary | ICD-10-CM | POA: Insufficient documentation

## 2020-08-21 DIAGNOSIS — E785 Hyperlipidemia, unspecified: Secondary | ICD-10-CM | POA: Insufficient documentation

## 2020-08-21 DIAGNOSIS — E119 Type 2 diabetes mellitus without complications: Secondary | ICD-10-CM | POA: Insufficient documentation

## 2020-08-21 DIAGNOSIS — I499 Cardiac arrhythmia, unspecified: Secondary | ICD-10-CM | POA: Diagnosis not present

## 2020-08-21 DIAGNOSIS — I77810 Thoracic aortic ectasia: Secondary | ICD-10-CM | POA: Insufficient documentation

## 2020-08-21 LAB — ECHOCARDIOGRAM COMPLETE
Area-P 1/2: 4.14 cm2
S' Lateral: 3.1 cm

## 2020-08-22 ENCOUNTER — Encounter: Payer: Self-pay | Admitting: Internal Medicine

## 2020-09-30 ENCOUNTER — Other Ambulatory Visit: Payer: Self-pay

## 2020-09-30 ENCOUNTER — Encounter: Payer: Self-pay | Admitting: Cardiovascular Disease

## 2020-09-30 ENCOUNTER — Ambulatory Visit (INDEPENDENT_AMBULATORY_CARE_PROVIDER_SITE_OTHER): Payer: HMO

## 2020-09-30 ENCOUNTER — Ambulatory Visit: Payer: HMO | Admitting: Cardiovascular Disease

## 2020-09-30 VITALS — BP 135/80 | HR 60 | Ht 71.0 in | Wt 183.2 lb

## 2020-09-30 DIAGNOSIS — I499 Cardiac arrhythmia, unspecified: Secondary | ICD-10-CM | POA: Diagnosis not present

## 2020-09-30 DIAGNOSIS — R002 Palpitations: Secondary | ICD-10-CM | POA: Diagnosis not present

## 2020-09-30 NOTE — Patient Instructions (Signed)
Medication Instructions:  The current medical regimen is effective;  continue present plan and medications.  *If you need a refill on your cardiac medications before your next appointment, please call your pharmacy*  Testing/Procedures: [place] Monitor Instructions  Your physician has requested you wear a ZIO patch monitor for 14 days.  This is a single patch monitor. [employer] supplies one patch monitor per enrollment. Additional stickers are not available. Please do not apply patch if you will be having a Nuclear Stress Test,  Echocardiogram, Cardiac CT, MRI, or Chest Xray during the period you would be wearing the  monitor. The patch cannot be worn during these tests. You cannot remove and re-apply the  ZIO XT patch monitor.  Your ZIO patch monitor will be mailed 3 day USPS to your address on file. It may take 3-5 days  to receive your monitor after you have been enrolled.  Once you have received your monitor, please review the enclosed instructions. Your monitor  has already been registered assigning a specific monitor serial # to you.  Billing and Patient Assistance Program Information  We have supplied [employer] with any of your insurance information on file for billing purposes. [employer] offers a sliding scale Patient Assistance Program for patients that do not have  insurance, or whose insurance does not completely cover the cost of the ZIO monitor.  You must apply for the Patient Assistance Program to qualify for this discounted rate.  To apply, please call [employer] at [phone], select option 4, select option 2, ask to apply for  Patient Assistance Program. Theodore Demark will ask your household income, and how many people  are in your household. They will quote your out-of-pocket cost based on that information.  [employer] will also be able to set up a 24-month interest-free payment plan if needed.  Applying the monitor   Shave hair from upper left chest.  Hold abrader disc  by orange tab. Rub abrader in 40 strokes over the upper left chest as  indicated in your monitor instructions.  Clean area with 4 enclosed alcohol pads. Let dry.  Apply patch as indicated in monitor instructions. Patch will be placed under collarbone on left  side of chest with arrow pointing upward.  Rub patch adhesive wings for 2 minutes. Remove white label marked "1". Remove the white  label marked "2". Rub patch adhesive wings for 2 additional minutes.  While looking in a mirror, press and release button in center of patch. A small green light will  flash 3-4 times. This will be your only indicator that the monitor has been turned on.  Do not shower for the first 24 hours. You may shower after the first 24 hours.  Press the button if you feel a symptom. You will hear a small click. Record Date, Time and  Symptom in the Patient Logbook.  When you are ready to remove the patch, follow instructions on the last 2 pages of Patient  Logbook. Stick patch monitor onto the last page of Patient Logbook.  Place Patient Logbook in the blue and white box. Use locking tab on box and tape box closed  securely. The blue and white box has prepaid postage on it. Please place it in the mailbox as  soon as possible. Your physician should have your test results approximately 7 days after the  monitor has been mailed back to [facility]  Call [facility] [phone] you have questions regarding  your ZIO XT patch  monitor. Call them immediately if you see an orange light blinking on your  monitor.  If your monitor falls off in less than 4 days, contact our Monitor department at [phone].  If your monitor becomes loose or falls off after 4 days call [employer] at [phone] for  suggestions on securing your monitor    Follow-Up: At [facility], you and your health needs are our priority.  As part of our continuing mission to provide you with exceptional heart care, we  have created designated Provider Care Teams.  These Care Teams include your primary Cardiologist (physician) and Advanced Practice Providers (APPs -  Physician Assistants and Nurse Practitioners) who all work together to provide you with the care you need, when you need it.  We recommend signing up for the patient portal called "MyChart".  Sign up information is provided on this After Visit Summary.  MyChart is used to connect with patients for Virtual Visits (Telemedicine).  Patients are able to view lab/test results, encounter notes, upcoming appointments, etc.  Non-urgent messages can be sent to your provider as well.   To learn more about what you can do with MyChart, go to [email or web address].    Your next appointment:   As needed  The format for your next appointment:   In Person  Provider:   Quay Burow, MD

## 2020-09-30 NOTE — Progress Notes (Signed)
09/30/2020 [place]   12-16-1945  [id number]  Primary Physician Jenny Reichmann Hunt Oris, MD Primary Cardiologist: Lorretta Harp MD Lupe Carney, [place]  HPI:  Samuel Krueger is a 75 y.o. thin appearing widowed African-American male father of 1 daughter, grandfather 2 grandchildren who is retired from working at [employer].  He was referred by Dr. Jenny Reichmann, his PCP for an irregular heart beat.  His only cardiac risk factor is diabetes.  He has an excellent lipid profile performed 07/23/2020 with a total cholesterol 168, LDL 94 and HDL 48.  He denies chest pain or shortness of breath.  Never had a heart tach or stroke.  He describes occasional palpitations but this does not sound like an issue for him.  He 2D echo was performed that was essentially normal.  An event monitor was ordered which she never wore.   Current Meds  Medication Sig   aspirin EC 81 MG tablet Take 1 tablet (81 mg total) by mouth daily.   Blood Glucose Monitoring Suppl (ONETOUCH VERIO) w/Device KIT Use as directed twice daily E11.9   glipiZIDE (GLUCOTROL XL) 2.5 MG 24 hr tablet TAKE 1 TABLET (2.5 MG TOTAL) BY MOUTH DAILY WITH BREAKFAST.   glucose blood (ONETOUCH VERIO) test strip USE AS DIRECTED TWICE A DAY   Lancets MISC Use as directed twice daily E11.9   polyethylene glycol powder (GLYCOLAX/MIRALAX) 17 GM/SCOOP powder Take 17 g by mouth 2 (two) times daily as needed.   Vitamin D, Ergocalciferol, (DRISDOL) 1.25 MG (50000 UNIT) CAPS capsule Take 1 capsule (50,000 Units total) by mouth every 7 (seven) days.     Allergies  Allergen Reactions   Metformin And Related Other (See Comments)    Chest tightness   Shrimp [Shellfish Allergy] Other (See Comments)    "strange GI reaction"    Social History   Socioeconomic History   Marital status: Widowed    Spouse name: single   Number of children: Not on file   Years of education: 12   Highest education level: Not on file  Occupational History   Occupation: retired   Tobacco Use   Smoking status: Former    Years: 0.80    Types: Cigarettes    Quit date: 07/07/2000    Years since quitting: 20.2   Smokeless tobacco: Never  Substance and Sexual Activity   Alcohol use: No    Comment: Recovering alcoholic since 1561   Drug use: No   Sexual activity: Not on file  Other Topics Concern   Not on file  Social History Narrative   Not on file   Social Determinants of Health   Financial Resource Strain: Not on file  Food Insecurity: Not on file  Transportation Needs: Not on file  Physical Activity: Not on file  Stress: Not on file  Social Connections: Not on file  Intimate Partner Violence: Not on file     Review of Systems: General: negative for chills, fever, night sweats or weight changes.  Cardiovascular: negative for chest pain, dyspnea on exertion, edema, orthopnea, palpitations, paroxysmal nocturnal dyspnea or shortness of breath Dermatological: negative for rash Respiratory: negative for cough or wheezing Urologic: negative for hematuria Abdominal: negative for nausea, vomiting, diarrhea, bright red blood per rectum, melena, or hematemesis Neurologic: negative for visual changes, syncope, or dizziness All other systems reviewed and are otherwise negative except as noted above.    Blood pressure 135/80, pulse 60, height 5' 11"  (1.803 m), weight 183 lb 3.2  oz (83.1 kg), SpO2 98 %.  General appearance: alert and no distress Neck: no adenopathy, no carotid bruit, no JVD, supple, symmetrical, trachea midline, and thyroid not enlarged, symmetric, no tenderness/mass/nodules Lungs: clear to auscultation bilaterally Heart: regular rate and rhythm, S1, S2 normal, no murmur, click, rub or gallop Extremities: extremities normal, atraumatic, no cyanosis or edema Pulses: 2+ and symmetric Skin: Skin color, texture, turgor normal. No rashes or lesions Neurologic: Grossly normal  EKG sinus rhythm at 60 with incomplete right bundle branch block.  I  personally reviewed this EKG.  ASSESSMENT AND PLAN:   Irregular heart beat Mr. [name] was referred by Dr. Jenny Reichmann, his PCP for evaluation of "palpitations/irregular heart rate.  He has essentially no cardiac risk factors other than diabetes.  He denies chest pain or shortness of breath.  He has described what sounds like palpitations although its not completely clear that this is the issue.  His primary did order an event monitor which she never wore.  A 2D echo was performed a month ago that was essentially normal.  I am going to get a 2-week Zio patch to further evaluate.     Lorretta Harp MD FACP,FACC,FAHA, [facility] 09/30/2020 10:23 AM

## 2020-09-30 NOTE — Progress Notes (Unsigned)
Enrolled patient for a 14 day Zio XT  monitor to be mailed to patients home  °

## 2020-09-30 NOTE — Assessment & Plan Note (Signed)
Samuel Krueger was referred by Dr. Jenny Reichmann, his PCP for evaluation of "palpitations/irregular heart rate.  He has essentially no cardiac risk factors other than diabetes.  He denies chest pain or shortness of breath.  He has described what sounds like palpitations although its not completely clear that this is the issue.  His primary did order an event monitor which she never wore.  A 2D echo was performed a month ago that was essentially normal.  I am going to get a 2-week Zio patch to further evaluate.

## 2020-10-05 DIAGNOSIS — R002 Palpitations: Secondary | ICD-10-CM

## 2020-11-13 ENCOUNTER — Encounter: Payer: Self-pay | Admitting: Nurse Practitioner

## 2020-11-20 ENCOUNTER — Observation Stay (HOSPITAL_COMMUNITY)
Admission: EM | Admit: 2020-11-20 | Discharge: 2020-11-22 | Disposition: A | Discharge status: Home / Self Care | Payer: HMO | Attending: Internal Medicine | Admitting: Internal Medicine

## 2020-11-20 ENCOUNTER — Emergency Department (HOSPITAL_COMMUNITY): Payer: HMO

## 2020-11-20 ENCOUNTER — Other Ambulatory Visit: Payer: Self-pay

## 2020-11-20 ENCOUNTER — Encounter (HOSPITAL_COMMUNITY): Payer: Self-pay | Admitting: Family Medicine

## 2020-11-20 DIAGNOSIS — Z8546 Personal history of malignant neoplasm of prostate: Secondary | ICD-10-CM | POA: Diagnosis not present

## 2020-11-20 DIAGNOSIS — C61 Malignant neoplasm of prostate: Secondary | ICD-10-CM | POA: Diagnosis present

## 2020-11-20 DIAGNOSIS — Z79899 Other long term (current) drug therapy: Secondary | ICD-10-CM | POA: Insufficient documentation

## 2020-11-20 DIAGNOSIS — R0902 Hypoxemia: Secondary | ICD-10-CM | POA: Diagnosis not present

## 2020-11-20 DIAGNOSIS — E119 Type 2 diabetes mellitus without complications: Secondary | ICD-10-CM

## 2020-11-20 DIAGNOSIS — R29701 NIHSS score 1: Secondary | ICD-10-CM

## 2020-11-20 DIAGNOSIS — Z7984 Long term (current) use of oral hypoglycemic drugs: Secondary | ICD-10-CM | POA: Insufficient documentation

## 2020-11-20 DIAGNOSIS — R4781 Slurred speech: Secondary | ICD-10-CM | POA: Diagnosis present

## 2020-11-20 DIAGNOSIS — Z87891 Personal history of nicotine dependence: Secondary | ICD-10-CM | POA: Diagnosis not present

## 2020-11-20 DIAGNOSIS — I10 Essential (primary) hypertension: Secondary | ICD-10-CM | POA: Insufficient documentation

## 2020-11-20 DIAGNOSIS — G459 Transient cerebral ischemic attack, unspecified: Secondary | ICD-10-CM

## 2020-11-20 DIAGNOSIS — I639 Cerebral infarction, unspecified: Principal | ICD-10-CM | POA: Insufficient documentation

## 2020-11-20 DIAGNOSIS — E785 Hyperlipidemia, unspecified: Secondary | ICD-10-CM | POA: Diagnosis not present

## 2020-11-20 DIAGNOSIS — Z7982 Long term (current) use of aspirin: Secondary | ICD-10-CM | POA: Insufficient documentation

## 2020-11-20 DIAGNOSIS — Z20822 Contact with and (suspected) exposure to covid-19: Secondary | ICD-10-CM | POA: Insufficient documentation

## 2020-11-20 DIAGNOSIS — I63312 Cerebral infarction due to thrombosis of left middle cerebral artery: Secondary | ICD-10-CM

## 2020-11-20 DIAGNOSIS — R29818 Other symptoms and signs involving the nervous system: Secondary | ICD-10-CM | POA: Diagnosis not present

## 2020-11-20 DIAGNOSIS — Z8673 Personal history of transient ischemic attack (TIA), and cerebral infarction without residual deficits: Secondary | ICD-10-CM | POA: Diagnosis present

## 2020-11-20 HISTORY — DX: Transient cerebral ischemic attack, unspecified: G45.9

## 2020-11-20 LAB — COMPREHENSIVE METABOLIC PANEL
ALT: 12 U/L (ref 0–44)
AST: 25 U/L (ref 15–41)
Albumin: 3.8 g/dL (ref 3.5–5.0)
Alkaline Phosphatase: 43 U/L (ref 38–126)
Anion gap: 7 (ref 5–15)
BUN: 16 mg/dL (ref 8–23)
CO2: 26 mmol/L (ref 22–32)
Calcium: 9.3 mg/dL (ref 8.9–10.3)
Chloride: 106 mmol/L (ref 98–111)
Creatinine, Ser: 1.09 mg/dL (ref 0.61–1.24)
GFR, Estimated: >60 mL/min (ref >60)
Glucose, Bld: 104 mg/dL — ABNORMAL HIGH (ref 70–99)
Potassium: 4.4 mmol/L (ref 3.5–5.1)
Sodium: 139 mmol/L (ref 135–145)
Total Bilirubin: 0.7 mg/dL (ref 0.3–1.2)
Total Protein: 7 g/dL (ref 6.5–8.1)

## 2020-11-20 LAB — RESP PANEL BY RT-PCR (FLU A&B, COVID) ARPGX2
Influenza A by PCR: NEGATIVE
Influenza B by PCR: NEGATIVE
SARS Coronavirus 2 by RT PCR: NEGATIVE

## 2020-11-20 LAB — I-STAT CHEM 8, ED
BUN: 21 mg/dL (ref 8–23)
Calcium, Ion: 1.16 mmol/L (ref 1.15–1.40)
Chloride: 105 mmol/L (ref 98–111)
Creatinine, Ser: 1 mg/dL (ref 0.61–1.24)
Glucose, Bld: 100 mg/dL — ABNORMAL HIGH (ref 70–99)
HCT: 42 % (ref 39.0–52.0)
Hemoglobin: 14.3 g/dL (ref 13.0–17.0)
Potassium: 4.3 mmol/L (ref 3.5–5.1)
Sodium: 141 mmol/L (ref 135–145)
TCO2: 29 mmol/L (ref 22–32)

## 2020-11-20 LAB — DIFFERENTIAL
Abs Immature Granulocytes: 0.03 10*3/uL (ref 0.00–0.07)
Basophils Absolute: 0.1 10*3/uL (ref 0.0–0.1)
Basophils Relative: 1 %
Eosinophils Absolute: 0.4 10*3/uL (ref 0.0–0.5)
Eosinophils Relative: 6 %
Immature Granulocytes: 1 %
Lymphocytes Relative: 20 %
Lymphs Abs: 1.2 10*3/uL (ref 0.7–4.0)
Monocytes Absolute: 0.5 10*3/uL (ref 0.1–1.0)
Monocytes Relative: 9 %
Neutro Abs: 4.1 10*3/uL (ref 1.7–7.7)
Neutrophils Relative %: 63 %

## 2020-11-20 LAB — PROTIME-INR
INR: 0.9 (ref 0.8–1.2)
Prothrombin Time: 12.6 seconds (ref 11.4–15.2)

## 2020-11-20 LAB — CBC
HCT: 43.6 % (ref 39.0–52.0)
Hemoglobin: 13.6 g/dL (ref 13.0–17.0)
MCH: 26 pg (ref 26.0–34.0)
MCHC: 31.2 g/dL (ref 30.0–36.0)
MCV: 83.2 fL (ref 80.0–100.0)
Platelets: 177 10*3/uL (ref 150–400)
RBC: 5.24 MIL/uL (ref 4.22–5.81)
RDW: 13.8 % (ref 11.5–15.5)
WBC: 6.2 10*3/uL (ref 4.0–10.5)
nRBC: 0 % (ref 0.0–0.2)

## 2020-11-20 LAB — CBG MONITORING, ED
Glucose-Capillary: 97 mg/dL (ref 70–99)
Glucose-Capillary: 87 mg/dL (ref 70–99)
Glucose-Capillary: 138 mg/dL — ABNORMAL HIGH (ref 70–99)

## 2020-11-20 LAB — APTT: aPTT: 28 seconds (ref 24–36)

## 2020-11-20 MED ORDER — CLOPIDOGREL BISULFATE 75 MG PO TABS
75.0000 mg | ORAL_TABLET | Freq: Every day | ORAL | Status: DC
  Administered 2020-11-20 – 2020-11-22 (×3): 75 mg via ORAL
  Filled 2020-11-20 (×3): qty 1

## 2020-11-20 MED ORDER — ASPIRIN 325 MG PO TABS
325.0000 mg | ORAL_TABLET | Freq: Once | ORAL | Status: AC
  Administered 2020-11-20: 325 mg via ORAL
  Filled 2020-11-20: qty 1

## 2020-11-20 MED ORDER — SODIUM CHLORIDE 0.9% FLUSH
3.0000 mL | Freq: Once | INTRAVENOUS | Status: AC
Start: 2020-11-20 — End: 2020-11-20
  Administered 2020-11-20: 3 mL via INTRAVENOUS

## 2020-11-20 MED ORDER — ADULT MULTIVITAMIN W/MINERALS CH
1.0000 | ORAL_TABLET | ORAL | Status: DC
  Administered 2020-11-20: 1 via ORAL
  Filled 2020-11-20: qty 1

## 2020-11-20 MED ORDER — INSULIN ASPART 100 UNIT/ML IJ SOLN
0.0000 [IU] | Freq: Three times a day (TID) | INTRAMUSCULAR | Status: DC
  Administered 2020-11-21: 2 [IU] via SUBCUTANEOUS
  Administered 2020-11-21 – 2020-11-22 (×2): 1 [IU] via SUBCUTANEOUS

## 2020-11-20 MED ORDER — IOHEXOL 350 MG/ML SOLN
75.0000 mL | Freq: Once | INTRAVENOUS | Status: AC | PRN
  Administered 2020-11-20: 75 mL via INTRAVENOUS

## 2020-11-20 MED ORDER — ENOXAPARIN SODIUM 40 MG/0.4ML IJ SOSY
40.0000 mg | PREFILLED_SYRINGE | INTRAMUSCULAR | Status: DC
  Administered 2020-11-20 – 2020-11-21 (×2): 40 mg via SUBCUTANEOUS
  Filled 2020-11-20 (×2): qty 0.4

## 2020-11-20 MED ORDER — ATORVASTATIN CALCIUM 10 MG PO TABS
10.0000 mg | ORAL_TABLET | Freq: Every day | ORAL | Status: DC
  Administered 2020-11-20 – 2020-11-21 (×2): 10 mg via ORAL
  Filled 2020-11-20: qty 1

## 2020-11-20 MED ORDER — STROKE: EARLY STAGES OF RECOVERY BOOK
Freq: Once | Status: AC
  Filled 2020-11-20 (×2): qty 1

## 2020-11-20 MED ORDER — ENOXAPARIN SODIUM 40 MG/0.4ML IJ SOSY: 40.0000 mg | PREFILLED_SYRINGE | INTRAMUSCULAR | Status: DC

## 2020-11-20 MED ORDER — ACETAMINOPHEN 650 MG RE SUPP: 650.0000 mg | RECTAL | Status: DC | PRN

## 2020-11-20 MED ORDER — SENNOSIDES-DOCUSATE SODIUM 8.6-50 MG PO TABS: 1.0000 | ORAL_TABLET | Freq: Every evening | ORAL | Status: DC | PRN

## 2020-11-20 MED ORDER — ACETAMINOPHEN 325 MG PO TABS: 650.0000 mg | ORAL_TABLET | ORAL | Status: DC | PRN

## 2020-11-20 MED ORDER — THIAMINE HCL 100 MG PO TABS
100.0000 mg | ORAL_TABLET | Freq: Every day | ORAL | Status: DC
  Administered 2020-11-20 – 2020-11-22 (×3): 100 mg via ORAL
  Filled 2020-11-20 (×3): qty 1

## 2020-11-20 MED ORDER — ASPIRIN EC 81 MG PO TBEC
81.0000 mg | DELAYED_RELEASE_TABLET | Freq: Every day | ORAL | Status: DC
  Administered 2020-11-21 – 2020-11-22 (×2): 81 mg via ORAL
  Filled 2020-11-20 (×3): qty 1

## 2020-11-20 MED ORDER — POLYETHYLENE GLYCOL 3350 17 G PO PACK
17.0000 g | PACK | Freq: Every day | ORAL | Status: DC
  Administered 2020-11-20 – 2020-11-22 (×3): 17 g via ORAL
  Filled 2020-11-20 (×3): qty 1

## 2020-11-20 MED ORDER — ACETAMINOPHEN 160 MG/5ML PO SOLN: 650.0000 mg | ORAL | Status: DC | PRN

## 2020-11-20 NOTE — ED Triage Notes (Signed)
Pt BIB EMS due to a code stroke. LSN 11/19/2020 at 2200. Pt had sudden onset of slurred speech and right numbness. Pt is axox4 on arrival. VSS.

## 2020-11-20 NOTE — ED Notes (Signed)
RN can not access for stoke assessment bc pt is at MRI

## 2020-11-20 NOTE — ED Notes (Signed)
Patient transported to MRI 

## 2020-11-20 NOTE — H&P (Addendum)
History and Physical    Samuel Krueger TFT:[id number] DOB: 07/31/1945 DOA: 11/20/2020  PCP: Biagio Borg, MD Consultants:  cardiology: Dr. Gwenlyn Found urology: Dr. Janice Norrie  Patient coming from:  Home  by EMS.  lives with roommates   Chief Complaint: slurred speech   HPI: Samuel Krueger is a 75 y.o. male with medical history significant of type 2 diabetes, hyperlipidemia, elevated blood pressure without diagnosis of hypertension, past history of prostate cancer, and recovering alcoholic in remission who presented to ED with complaints of slurred speech, right leg numbness and weakness. He states around 8am he was sitting at his computer and when he got up his right leg felt numb and weak. He wobbled to the bathroom and then told his roommate to call 911 as he thought he was having a stroke. When he told him roommate to call 911 he also noticed his speech was slurred and he had difficulty talking.   Denies any facial drooping, drooling. No weakness or numbness in his RUE. He states his symptoms have improved. His leg feels normal now, but he hasn't tried to walk and his speech seems to be back to normal.   Per nurse he has been having intermittent slurred speech.   He denies any headaches, vision changes, chest pain, palpitations, shortness of breath, coughing, stomach pain, N/V/D. He does have constipation x 1.5 months. No leg swelling or urinary complaints.   ED Course: vitals: Afebrile, blood pressure 141/107, heart rate 69, respiratory rate 18, oxygen 100% on room air. Pertinent labs: None.  CT head: No evidence of acute infarct or hemorrhage.  CTA head: No large vessel occlusion or stenosis in head or neck.  Stroke initiated in ED.  Patient given 325 mg of aspirin.  Neurology consulted and [facility] was asked to admit.  Review of Systems: As per HPI; otherwise review of systems reviewed and negative.   Ambulatory Status:  Ambulates without assistance, but will occasionally use a cane at times.      Past Medical History:  Diagnosis Date   Hyperlipidemia    Nocturia    Prostate cancer [facility]) urologist-- dr Janice Norrie   active survellance since dx Feb 2014--  now has Stage T1c, Gleason (3+4)7, PSA 7.75   Recovering alcoholic in remission ([place])    since rehab 20 yrs ago-- approx 1995   S/P radiation therapy 07/15/14   Radioactive Seed Implant- Prostate/proximal seminal vesicles, 14,500 cGy utilizing 74 I-125 seeds implanted in 30 needles with an individual seed activity 0.658 mCi per seed for a total implant activity of 48.6.millicuries.   Tinnitus of both ears    Type 2 diabetes, diet controlled ([place])     Past Surgical History:  Procedure Laterality Date   PROSTATE BIOPSY  03/26/14   RADIOACTIVE SEED IMPLANT N/A 07/15/2014   Procedure: RADIOACTIVE SEED IMPLANT/BRACHYTHERAPY IMPLANT;  Surgeon: Lowella Bandy, MD;  Location[facility];  Service: Urology;  Laterality: N/A;    Social History   Socioeconomic History   Marital status: Widowed    Spouse name: single   Number of children: Not on file   Years of education: 12   Highest education level: Not on file  Occupational History   Occupation: retired  Tobacco Use   Smoking status: Former    Years: 0.80    Types: Cigarettes    Quit date: 07/07/2000    Years since quitting: 20.3   Smokeless tobacco: Never  Substance and Sexual Activity   Alcohol use: No  Comment: Recovering alcoholic since 9935   Drug use: No   Sexual activity: Not on file  Other Topics Concern   Not on file  Social History Narrative   Not on file   Social Determinants of Health   Financial Resource Strain: Not on file  Food Insecurity: Not on file  Transportation Needs: Not on file  Physical Activity: Not on file  Stress: Not on file  Social Connections: Not on file  Intimate Partner Violence: Not on file    Allergies  Allergen Reactions   Jardiance [Empagliflozin] Diarrhea   Metformin And Related Other (See Comments)    Chest  tightness   Shrimp [Shellfish Allergy] Nausea Only and Other (See Comments)    "strange GI reaction"/stomach turns over- no breathing issues, however    Family History  Problem Relation Age of Onset   Alcohol abuse Mother    Heart disease Mother    Hypertension Mother    Mental illness Other    HIV/AIDS Brother    Colon cancer Neg Hx     Prior to Admission medications   Medication Sig Start Date End Date Taking? Authorizing Provider  ALPHA LIPOIC ACID PO Take 1 capsule by mouth daily.   Yes [provider]  Cholecalciferol (VITAMIN D-3 PO) Take 1 capsule by mouth 2 (two) times a week.   Yes [provider]  glipiZIDE (GLUCOTROL XL) 2.5 MG 24 hr tablet TAKE 1 TABLET (2.5 MG TOTAL) BY MOUTH DAILY WITH BREAKFAST. 08/14/20  Yes Biagio Borg, MD  MAGNESIUM PO Take 1 tablet by mouth daily.   Yes [provider]  Multiple Vitamin (MULTIVITAMIN) tablet Take 1 tablet by mouth 3 (three) times a week.   Yes [provider]  Thiamine HCl (VITAMIN B-1 PO) Take 1 tablet by mouth daily.   Yes [provider]  vitamin C (ASCORBIC ACID) 500 MG tablet Take 500-1,000 mg by mouth daily.   Yes [provider]  aspirin EC 81 MG tablet Take 1 tablet (81 mg total) by mouth daily. Patient not taking: No sig reported 09/25/15   Biagio Borg, MD  atorvastatin (LIPITOR) 10 MG tablet Take 1 tablet (10 mg total) by mouth daily. Patient not taking: No sig reported 02/24/20 02/23/21  Biagio Borg, MD  Blood Glucose Monitoring Suppl [facility] VERIO) w/Device KIT Use as directed twice daily E11.9 08/06/19   Biagio Borg, MD  empagliflozin (JARDIANCE) 25 MG TABS tablet Take 25 mg by mouth daily. Patient not taking: No sig reported 07/19/18   Biagio Borg, MD  glucose blood [facility] VERIO) test strip USE AS DIRECTED TWICE A DAY 08/13/20   Biagio Borg, MD  Lancets MISC Use as directed twice daily E11.9 08/06/19   Biagio Borg, MD  polyethylene glycol powder  (GLYCOLAX/MIRALAX) 17 GM/SCOOP powder Take 17 g by mouth 2 (two) times daily as needed. Patient not taking: No sig reported 07/12/18   Biagio Borg, MD  Vitamin D, Ergocalciferol, (DRISDOL) 1.25 MG (50000 UNIT) CAPS capsule Take 1 capsule (50,000 Units total) by mouth every 7 (seven) days. Patient not taking: No sig reported 08/03/19   Biagio Borg, MD    Physical Exam: Vitals:   11/20/20 1143 11/20/20 1154 11/20/20 1158 11/20/20 1200  BP: (!) 162/119   (!) 142/92  Pulse:  (!) 57 (!) 57 (!) 52  Resp: (!) 21 17 (!) 23 15  Temp:      TempSrc:  SpO2:  100% 98% 100%  Weight:         General:  Appears calm and comfortable and is in NAD Eyes:  PERRL, EOMI, normal lids, iris ENT:  grossly normal hearing, lips & tongue, mmm; appropriate dentition Neck:  no LAD, masses or thyromegaly; no carotid bruits Cardiovascular:  RRR, no m/r/g. No LE edema.  Respiratory:   CTA bilaterally with no wheezes/rales/rhonchi.  Normal respiratory effort. Abdomen:  soft, NT, ND, NABS Back:   normal alignment, no CVAT Skin:  no rash or induration seen on limited exam Musculoskeletal:  grossly normal tone BUE/BLE, good ROM, no bony abnormality. Strength 5/5 in all extremities  Lower extremity:  No LE edema.  Limited foot exam with no ulcerations.  2+ distal pulses. Psychiatric:  grossly normal mood and affect, speech fluent and appropriate, AOx3 Neurologic:  CN 2-12 grossly intact, moves all extremities in coordinated fashion, sensation intact. Heel to shin intact bilaterally. Negative pronator drift.     Radiological Exams on Admission: Independently reviewed - see discussion in A/P where applicable  CT HEAD CODE STROKE WO CONTRAST  Result Date: 11/20/2020 CLINICAL DATA:  Code stroke.  Neuro deficit, acute, stroke suspected EXAM: CT HEAD WITHOUT CONTRAST TECHNIQUE: Contiguous axial images were obtained from the base of the skull through the vertex without intravenous contrast. COMPARISON:  None.  FINDINGS: Brain: No evidence of acute large vascular territory infarction, hemorrhage, hydrocephalus, extra-axial collection or mass lesion/mass effect. Vascular: No hyperdense vessel identified. Skull: No acute fracture. Sinuses/Orbits: Mild paranasal sinus mucosal thickening. Other: No mastoid effusions. ASPECTS [facility] Stroke Program Early CT Score) total score (0-10 with 10 being normal): 10. IMPRESSION: No evidence of acute large vascular territory infarct or acute hemorrhage. ASPECTS is 10. Code stroke imaging results were communicated on 11/20/2020 at 10:02 am to provider Dr. Theda Sers Via secure text paging. Electronically Signed   By: Margaretha Sheffield M.D.   On: 11/20/2020 10:03   CT ANGIO HEAD CODE STROKE  Result Date: 11/20/2020 CLINICAL DATA:  Neuro deficit, acute, stroke suspected EXAM: CT ANGIOGRAPHY HEAD AND NECK TECHNIQUE: Multidetector CT imaging of the head and neck was performed using the standard protocol during bolus administration of intravenous contrast. Multiplanar CT image reconstructions and MIPs were obtained to evaluate the vascular anatomy. Carotid stenosis measurements (when applicable) are obtained utilizing NASCET criteria, using the distal internal carotid diameter as the denominator. CONTRAST:  7m OMNIPAQUE IOHEXOL 350 MG/ML SOLN COMPARISON:  None. FINDINGS: CTA NECK FINDINGS Aortic arch: Great vessel origins are patent. Right carotid system: No evidence of dissection, stenosis (50% or greater) or occlusion. Left carotid system: No evidence of dissection, stenosis (50% or greater) or occlusion. Vertebral arteries: Mildly right dominant. No evidence of dissection, stenosis (50% or greater) or occlusion. Skeleton: Moderate multilevel degenerative disc disease with disc height loss, endplate irregularity and posterior disc osteophyte complexes. Reversal of the normal cervical lordosis. Other neck: No acute findings. Upper chest: Visualized lung apices are clear. Review of the  MIP images confirms the above findings CTA HEAD FINDINGS Anterior circulation: Bilateral intracranial ICAs, MCAs, and ACAs are patent without proximal hemodynamically significant stenosis. No aneurysm identified. Posterior circulation: Bilateral intradural vertebral arteries, basilar artery, and posterior cerebral arteries are patent without proximal hemodynamically significant stenosis. No aneurysm identified. Venous sinuses: As permitted by contrast timing, patent. Review of the MIP images confirms the above findings IMPRESSION: No large vessel occlusion or proximal hemodynamically significant stenosis in the head or neck. Electronically Signed   By: FMargaretha Sheffield  M.D.   On: 11/20/2020 10:24   CT ANGIO NECK CODE STROKE  Result Date: 11/20/2020 CLINICAL DATA:  Neuro deficit, acute, stroke suspected EXAM: CT ANGIOGRAPHY HEAD AND NECK TECHNIQUE: Multidetector CT imaging of the head and neck was performed using the standard protocol during bolus administration of intravenous contrast. Multiplanar CT image reconstructions and MIPs were obtained to evaluate the vascular anatomy. Carotid stenosis measurements (when applicable) are obtained utilizing NASCET criteria, using the distal internal carotid diameter as the denominator. CONTRAST:  59m OMNIPAQUE IOHEXOL 350 MG/ML SOLN COMPARISON:  None. FINDINGS: CTA NECK FINDINGS Aortic arch: Great vessel origins are patent. Right carotid system: No evidence of dissection, stenosis (50% or greater) or occlusion. Left carotid system: No evidence of dissection, stenosis (50% or greater) or occlusion. Vertebral arteries: Mildly right dominant. No evidence of dissection, stenosis (50% or greater) or occlusion. Skeleton: Moderate multilevel degenerative disc disease with disc height loss, endplate irregularity and posterior disc osteophyte complexes. Reversal of the normal cervical lordosis. Other neck: No acute findings. Upper chest: Visualized lung apices are clear. Review  of the MIP images confirms the above findings CTA HEAD FINDINGS Anterior circulation: Bilateral intracranial ICAs, MCAs, and ACAs are patent without proximal hemodynamically significant stenosis. No aneurysm identified. Posterior circulation: Bilateral intradural vertebral arteries, basilar artery, and posterior cerebral arteries are patent without proximal hemodynamically significant stenosis. No aneurysm identified. Venous sinuses: As permitted by contrast timing, patent. Review of the MIP images confirms the above findings IMPRESSION: No large vessel occlusion or proximal hemodynamically significant stenosis in the head or neck. Electronically Signed   By: FMargaretha SheffieldM.D.   On: 11/20/2020 10:24    EKG: Independently reviewed.  NSR with rate 68; nonspecific ST changes with no evidence of acute ischemia   Labs on Admission: I have personally reviewed the available labs and imaging studies at the time of the admission.  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  Risk factors include age, diabetes, hyperlipidemia, and elevated blood pressure. 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  -place in observation on telemetry for stroke work up.  -Neurochecks per protocol -Neurology consulted -MRI brain without contrast: acute infarct in posterior limb of the left internal capsule.  -echo done 08/2020 -start daily aspirin 81 mg (he has not been taking this)  -plavix 784mx 3 weeks per neurology  -Permissive hypertension first 24 hours <220/110 -N.p.o. until bedside swallow screen -PT/ OT/ SLP consult -fasting lipid panel for AM, started back his lipitor as he never started this medication   Type 2 diabetes, [place]-recently started glipizide. Hold while inpatient  -a1c  of 7.1 in 07/2020 -SSI an accuchecks per protocol   Hyperlipidemia Lipid panel: 07/2020. TC: 168, TG: 130, HDL: 48, LDL: 94 Not taking his lipitor, goal LDL < 70  -start back lipitor 1048mightly      Pro[place] cancer [place]. S/p radiation therapy. Stage T1c.   Constipation Start back miralax   Body mass index is 26.01 kg/m.   Level of care: Telemetry Medical DVT prophylaxis:  Lovenox  Code Status:  Full - confirmed with patient Family Communication: None present Disposition Plan:  The patient is from: home  Anticipated d/c is to: home   Requires inpatient hospitalization and is at significant risk of neurological worsening, requires constant monitoring, assessment  and MDM with specialists.  Patient is currently: stable  Consults called: neurology   Admission status:  observation   Dragon dictation used in completing this note.   Orma Flaming MD [facility]   How to contact the [facility] Attending or Consulting provider [place] or covering provider during after hours [place], for this patient?  Check the care team in [facility] and look for a) attending/consulting [facility] provider listed and b) the [facility] team listed Log into [email or web address] and use [place]'s universal password to access. If you do not have the password, please contact the hospital operator. Locate the [facility] provider you are looking for under [facility] and page to a number that you can be directly reached. If you still have difficulty reaching the provider, please page the [facility] (Director on Call) for the Hospitalists listed on amion for assistance.   11/20/2020, 1:00 PM

## 2020-11-20 NOTE — Evaluation (Addendum)
Physical Therapy Evaluation/Discharge Patient Details Name: Samuel Krueger MRN: [id number] DOB: 01-20-1946 Today's Date: 11/20/2020  History of Present Illness  Pt is a 75 y.o. male who presented 11/20/20 with slurred speech and R leg numbness and weakness. MRI revealed acute infarct in posterior limb of L internal capsule. PMH: prostate cancer s/p radiation therapy, tinnitus, DM2, hyperlipidemia   Clinical Impression  Pt presents with condition above. PTA, he was independent, living with 2 roommates in a 1-level house with 1 or 4 STE depending on entrance. Currently, pt displays symmetrical and intact bil UE strength and sensation along with intact bil lower extremity sensation. He displays deficits in R hip flexor and R hamstring strength (MMT of 4+ compared to MMT of 5 on L) and UE and lower extremity coordination with rapid alternating simultaneous movements. He does display some mild balance deficits when dependent on his R leg for balance. However, these deficits do not appear to be placing him at risk for falls during mobility, supported by his DGI score of 24 this date. Pt was able to perform all functional mobility, including stairs, without UE support, LOB, or assistance. Expect pt will quickly return to his baseline as he is not far from it currently. All education completed and questions answered. PT will sign off. Thank you for this referral.       Recommendations for follow up therapy are one component of a multi-disciplinary discharge planning process, led by the attending physician.  Recommendations may be updated based on patient status, additional functional criteria and insurance authorization.  Follow Up Recommendations No PT follow up    Equipment Recommendations  None recommended by PT    Recommendations for Other Services       Precautions / Restrictions Precautions Precautions: None Restrictions Weight Bearing Restrictions: No      Mobility  Bed  Mobility Overal bed mobility: Modified Independent             General bed mobility comments: Pt transitions supine <> sit EOB with HOB elevated without assistance.    Transfers Overall transfer level: Independent Equipment used: None             General transfer comment: Pt is able to transfer sit <> stand without LOB or assistance.  Ambulation/Gait Ambulation/Gait assistance: Supervision Gait Distance (Feet): 400 Feet Assistive device: None Gait Pattern/deviations: Step-through pattern;Decreased dorsiflexion - right Gait velocity: WFL Gait velocity interpretation: >4.37 ft/sec, indicative of normal walking speed (when cued to increase speed) General Gait Details: Pt with R leg externally rotated and noted occasional slight drag of R foot during swing phase compared to L. Pt reports his leg was externally rotated at baseline. No LOB, even with challenges of DGI, supervision for safety.  Stairs Stairs: Yes Stairs assistance: Min guard Stair Management: No rails;Alternating pattern;Step to pattern;Forwards Number of Stairs: 3 General stair comments: Used step stool in ED to simulate a stair. Cued pt to perform step-to for first rep, no LOB. Cued pt to then perform reciprocal pattern, no overt LOB but some minor instability noted, but possibly due to step stool being uneven and wobbling some, min guard for safety. No UE support.  Wheelchair Mobility    Modified Rankin (Stroke Patients Only) Modified Rankin (Stroke Patients Only) Pre-Morbid Rankin Score: No symptoms Modified Rankin: No significant disability     Balance Overall balance assessment: Mild deficits observed, not formally tested  Standardized Balance Assessment Standardized Balance Assessment : Dynamic Gait Index   Dynamic Gait Index Level Surface: Normal Change in Gait Speed: Normal Gait with Horizontal Head Turns: Normal Gait with Vertical Head Turns:  Normal Gait and Pivot Turn: Normal Step Over Obstacle: Normal Step Around Obstacles: Normal Steps: Normal Total Score: 24       Pertinent Vitals/Pain Pain Assessment: No/denies pain    Home Living Family/patient expects to be discharged to:: Private residence Living Arrangements: Non-relatives/Friends Available Help at Discharge: Friend(s);Available 24 hours/day Type of Home: House Home Access: Stairs to enter Entrance Stairs-Rails: Right (for the 4 stairs) Entrance Stairs-Number of Steps: 1 or 4 depending on entrance (1 step in back, but longer distance to access and no handrail) Home Layout: One level Home Equipment: Cane - quad      Prior Function Level of Independence: Independent         Comments: Pt drives. Retired.     Hand Dominance   Dominant Hand: Right    Extremity/Trunk Assessment   Upper Extremity Assessment Upper Extremity Assessment: RUE deficits/detail;LUE deficits/detail RUE Deficits / Details: Symmetrical strength in bil UEs with gross MMT scores of 4+ to 5; sensation intact bil; no dysmetria noted; deficits noted only with rapid alternating movements with bil UE simultaneous quick forearm supination <> pronation LUE Deficits / Details: Symmetrical strength in bil UEs with gross MMT scores of 4+ to 5; sensation intact bil; no dysmetria noted; deficits noted only with rapid alternating movements with bil UE simultaneous quick forearm supination <> pronation    Lower Extremity Assessment Lower Extremity Assessment: RLE deficits/detail RLE Deficits / Details: MMT scores of 4+ in hip flexion and knee flexion compared to 5 on L, otherwise grossly symmetrical bil lower extremity strength; sensation intact bil; no dysmetria noted; deficits noted only with rapid alternating movements with simultaneous quick bil feet tapping    Cervical / Trunk Assessment Cervical / Trunk Assessment: Normal  Communication   Communication: No difficulties (reports speech  is back to his baseline)  Cognition Arousal/Alertness: Awake/alert Behavior During Therapy: WFL for tasks assessed/performed Overall Cognitive Status: Within Functional Limits for tasks assessed                                 General Comments: A&Ox4.      General Comments General comments (skin integrity, edema, etc.): Pt with difficulty maintaining SLS on R for > 2 seconds compared to being able to do so on L for at least 5 seconds; educated pt on "BE FAST"    Exercises     Assessment/Plan    PT Assessment Patent does not need any further PT services  PT Problem List         PT Treatment Interventions      PT Goals (Current goals can be found in the Care Plan section)  Acute Rehab PT Goals Patient Stated Goal: to get back to normal PT Goal Formulation: All assessment and education complete, DC therapy Time For Goal Achievement: 11/21/20 Potential to Achieve Goals: Good    Frequency     Barriers to discharge        Co-evaluation               AM-PAC PT "6 Clicks" Mobility  Outcome Measure Help needed turning from your back to your side while in a flat bed without using bedrails?: None Help needed moving from lying on your back to  sitting on the side of a flat bed without using bedrails?: None Help needed moving to and from a bed to a chair (including a wheelchair)?: None Help needed standing up from a chair using your arms (e.g., wheelchair or bedside chair)?: None Help needed to walk in hospital room?: A Little Help needed climbing 3-5 steps with a railing? : A Little 6 Click Score: 22    End of Session Equipment Utilized During Treatment: Gait belt Activity Tolerance: Patient tolerated treatment well Patient left: in bed;with call bell/phone within reach;with nursing/sitter in room Nurse Communication: Mobility status PT Visit Diagnosis: Unsteadiness on feet (R26.81);Other abnormalities of gait and mobility (R26.89);Other symptoms and signs  involving the nervous system (R29.898)    Time: 7116-5790 PT Time Calculation (min) (ACUTE ONLY): 39 min   Charges:   PT Evaluation $PT Eval Low Complexity: 1 Low PT Treatments $Gait Training: 23-37 mins        Moishe Spice, PT, DPT Acute Rehabilitation Services  Pager: [phone] Office: [place] 11/20/2020, 3:42 PM

## 2020-11-20 NOTE — Consult Note (Addendum)
Neurology consult   CC: code stroke.  History is obtained from: EMS, patient.   HPI: [name] is a 75 yo male with a PMHx of DM II, HLD, HTN, prostate cancer s/p seed implantation in 2016, and recovering alcoholic in remission. Patient presents per EMS as a code stroke for slurred speech and right sided weakness which had resolved on exam. Patient was last known well at 0830 am and a friend noticed his slurred speech at 0900.   After brief exam on the ED bridge and for airway clearance, patient was taken emergently to CT suite. CTH showed no acute finding.   CTA head and neck were without LVO.   Patient's symptoms were too mild to treat with a NIHSS of 1 for dysarthria. He saw cardiology in June for irregular heart beat/palpitations, but patient did not go back for Zio patch.   LKW: 0830 hours TNK given?: No, NIHSS 1.  IR Thrombectomy?: No, no LVO.  MRS: 0  NIHSS:  1a Level of Consciousness:0  1b LOC Questions: 0 1c LOC Commands: 0 2 Best Gaze: 0 3 Visual: 0 4 Facial Palsy: 0 5a Motor Arm - left: 0 5b Motor Arm - Right: 0 6a Motor Leg - Left: 0 6b Motor Leg - Right: 0 7 Limb Ataxia: 0 8 Sensory: 0 9 Best Language: 0 10 Dysarthria: 1 11 Extinction and Inattention: 0 TOTAL:  1  ROS: A robust ROS was unable to be performed due to emergent nature of event.   Past Medical History:  Diagnosis Date   Hyperlipidemia    Nocturia    Prostate cancer [facility]) urologist-- dr Janice Norrie   active survellance since dx Feb 2014--  now has Stage T1c, Gleason (3+4)7, PSA 7.75   Recovering alcoholic in remission ([place])    since rehab 20 yrs ago-- approx 1995   S/P radiation therapy 07/15/14   Radioactive Seed Implant- Prostate/proximal seminal vesicles, 14,500 cGy utilizing 74 I-125 seeds implanted in 30 needles with an individual seed activity 0.658 mCi per seed for a total implant activity of 48.6.millicuries.   Tinnitus of both ears    Type 2 diabetes, diet controlled ([place])    Family  History  Problem Relation Age of Onset   Alcohol abuse Mother    Heart disease Mother    Hypertension Mother    Mental illness Other    HIV/AIDS Brother    Colon cancer Neg Hx    Social History:  reports that he quit smoking about 20 years ago. His smoking use included cigarettes. He has never used smokeless tobacco. He reports that he does not drink alcohol and does not use drugs.  Prior to Admission medications   Medication Sig Start Date End Date Taking? Authorizing Provider  aspirin EC 81 MG tablet Take 1 tablet (81 mg total) by mouth daily. 09/25/15   Biagio Borg, MD  atorvastatin (LIPITOR) 10 MG tablet Take 1 tablet (10 mg total) by mouth daily. Patient not taking: Reported on 09/30/2020 02/24/20 02/23/21  Biagio Borg, MD  Blood Glucose Monitoring Suppl [facility] VERIO) w/Device KIT Use as directed twice daily E11.9 08/06/19   Biagio Borg, MD  empagliflozin (JARDIANCE) 25 MG TABS tablet Take 25 mg by mouth daily. Patient not taking: Reported on 09/30/2020 07/19/18   Biagio Borg, MD  glipiZIDE (GLUCOTROL XL) 2.5 MG 24 hr tablet TAKE 1 TABLET (2.5 MG TOTAL) BY MOUTH DAILY WITH BREAKFAST. 08/14/20   Biagio Borg, MD  glucose blood (  ONETOUCH VERIO) test strip USE AS DIRECTED TWICE A DAY 08/13/20   Biagio Borg, MD  Lancets MISC Use as directed twice daily E11.9 08/06/19   Biagio Borg, MD  polyethylene glycol powder (GLYCOLAX/MIRALAX) 17 GM/SCOOP powder Take 17 g by mouth 2 (two) times daily as needed. 07/12/18   Biagio Borg, MD  Vitamin D, Ergocalciferol, (DRISDOL) 1.25 MG (50000 UNIT) CAPS capsule Take 1 capsule (50,000 Units total) by mouth every 7 (seven) days. 08/03/19   Biagio Borg, MD    Exam: Current vital signs: BP (!) 141/107   Wt 84.6 kg   BMI 26.01 kg/m   Physical Exam  Constitutional: Appears well-developed and well-nourished.  Psych: Affect appropriate to situation. Eyes: No scleral injection. HENT: No OP obstruction. Head: Normocephalic.  Cardiovascular: Normal  rate and regular rhythm.  Respiratory: Effort normal.  GI: Abdomen soft.  No distension. There is no tenderness.  Skin: WDI  Neuro: Mental Status: Patient is awake, alert, oriented to person, place, month, year, and situation. Patient is able to give a clear and coherent history. No signs of neglect. Speech/Language:  Speech is clear, fluent without aphasia. + dysarthria. Repetition, naming, and comprehension intact.  Cranial Nerves: II: Visual Fields are full. Pupils are equal, round, and reactive to light.  III,IV, VI: EOMI without ptosis or diploplia.   V: Facial sensation is symmetric to light touch in V1, V2, and V3. VII: Facial movement symmetrical.  VIII: hearing is intact to voice. X: Uvula elevates symmetrically. XI: Shoulder shrug is symmetric. XII: tongue is midline without atrophy or fasciculations.  Motor: 5/5 throughout.  Sensory: Sensation is symmetric to light touch in all fours extremities.  Plantars: Toes are downgoing bilaterally.  Cerebellar: No ataxia noted with FNF and HKS bilaterally.   I have reviewed labs in epic and the pertinent results are: INR  .9       aPTT  28        creatinine 1.09.  MD reviewed the images obtained:  NCT head  No evidence of acute large vascular territory infarct or acute hemorrhage. ASPECTS is 10.  MRI brain Acute infarct in the posterior limb of the left internal capsule.  CTA head and neck  No large vessel occlusion or proximal hemodynamically significant stenosis in the head or neck.   Assessment: 75 yo male with stroke risk factors of DM II and HDL.  His BP was high on admission without a diagnosis of HTN or medications. No TNK was not given due to some resolved symptoms of right side weakness and his NIHSS was only 1. Only remaining symptoms was dysarthria. The patient was given an ASA 360m when he reached the ED room.   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  -Right sided  weakness, resolved.   Plan: - Medicine admit.  - Recommend TTE. - Recommend labs: HbA1c, lipid panel, TSH. - Recommend Statin or increased dose if LDL > 70. - Aspirin 864mdaily. - Clopidogrel 7580maily for 3 weeks. - SBP goal - Permissive hypertension first 24 h < 220/110.  - Will likely need hypertension medications post 24 hours.  -Telemetry monitoring for arrhythmia. - Bedside Swallow screen. - Stroke education. - PT/OT/SLP consult. - NIHSS as per protocol. - frequent neuro checks.  - Stroke team to follow.   Patient seen by KarClance BollSN, APN-BC, nurse practitioner and by MD. Note/plan to be edited by MD as needed.  Pager: [phone]  Attending Attestation:  Patient seen, examined, labs,vitals and notes reviewed. Discussed plan with Clance Boll, NP and agree with assessment and plan as documented above. I have independently reviewed the chart, obtained history, review of systems and examined the patient.  This patient is critically ill and at significant risk of neurological worsening, death and care requires constant monitoring of vital signs, hemodynamics,respiratory and cardiac monitoring, neurological assessment, discussion with family, other specialists and medical decision making of high complexity. I spent 73 minutes of neurocritical care time  in the care of  this patient. This was time spent independent of any time provided by nurse practitioner or PA.  Electronically signed by:  Lynnae Sandhoff, MD Page: [phone] 11/20/2020, 1:53 PM

## 2020-11-20 NOTE — Evaluation (Signed)
Occupational Therapy Evaluation Patient Details Name: Samuel Krueger MRN: [id number] DOB: 04/30/1945 Today's Date: 11/20/2020   History of Present Illness Pt is a 75 y.o. male who presented 11/20/20 with slurred speech and R leg numbness and weakness. MRI revealed acute infarct in posterior limb of L internal capsule. PMH: prostate cancer s/p radiation therapy, tinnitus, DM2, hyperlipidemia   Clinical Impression   Pt admitted with above. He demonstrates the below listed deficits and will benefit from continued OT to maximize safety and independence with BADLs.  Pt presents to OT with higher level cognitive deficits as well as deficits with saccades.   He is able to complete ADLs and functional mobility independently.  He lives with roommates and was fully independent PTA.  Anticipate he may benefit from OPOT at discharge.  Will follow acutely.        Recommendations for follow up therapy are one component of a multi-disciplinary discharge planning process, led by the attending physician.  Recommendations may be updated based on patient status, additional functional criteria and insurance authorization.   Follow Up Recommendations  Outpatient OT;Supervision - Intermittent    Equipment Recommendations  None recommended by OT    Recommendations for Other Services       Precautions / Restrictions Precautions Precautions: None Restrictions Weight Bearing Restrictions: No      Mobility Bed Mobility Overal bed mobility: Modified Independent             General bed mobility comments: Pt transitions supine <> sit EOB with HOB elevated without assistance.    Transfers Overall transfer level: Independent Equipment used: None             General transfer comment: Pt is able to transfer sit <> stand without LOB or assistance.    Balance Overall balance assessment: Mild deficits observed, not formally tested                               Standardized Balance  Assessment Standardized Balance Assessment : Dynamic Gait Index   Dynamic Gait Index Level Surface: Normal Change in Gait Speed: Normal Gait with Horizontal Head Turns: Normal Gait with Vertical Head Turns: Normal Gait and Pivot Turn: Normal Step Over Obstacle: Normal Step Around Obstacles: Normal Steps: Normal Total Score: 24     ADL either performed or assessed with clinical judgement   ADL Overall ADL's : Needs assistance/impaired;At baseline Eating/Feeding: Independent   Grooming: Wash/dry hands;Wash/dry face;Oral care;Brushing hair;Set up;Sitting   Upper Body Bathing: Set up;Sitting   Lower Body Bathing: Set up;Sit to/from stand   Upper Body Dressing : Set up;Sitting   Lower Body Dressing: Sit to/from stand;Set up   Toilet Transfer: Ambulation;Comfort height toilet;Set up   [place] and Hygiene: Sit to/from stand;Set up       Functional mobility during ADLs: Independent General ADL Comments: set up due to lines     Vision Baseline Vision/History: 0 No visual deficits Ability to See in Adequate Light: 0 Adequate Vision Assessment?: Yes Eye Alignment: Within Functional Limits Ocular Range of Motion: Within Functional Limits Alignment/Gaze Preference: Within Defined Limits Tracking/Visual Pursuits: Able to track stimulus in all quads without difficulty Saccades: Undershoots;Decreased speed of saccadic movement Visual Fields: No apparent deficits     Agricultural engineer Tested?: Yes   Praxis Praxis Praxis tested?: Within functional limits    Pertinent Vitals/Pain Pain Assessment: No/denies pain     Hand Dominance Right  Extremity/Trunk Assessment Upper Extremity Assessment Upper Extremity Assessment: Overall WFL for tasks assessed RUE Deficits / Details: Symmetrical strength in bil UEs with gross MMT scores of 4+ to 5; sensation intact bil; no dysmetria noted; deficits noted only with rapid alternating movements  with bil UE simultaneous quick forearm supination <> pronation LUE Deficits / Details: Symmetrical strength in bil UEs with gross MMT scores of 4+ to 5; sensation intact bil; no dysmetria noted; deficits noted only with rapid alternating movements with bil UE simultaneous quick forearm supination <> pronation   Lower Extremity Assessment Lower Extremity Assessment: RLE deficits/detail RLE Deficits / Details: MMT scores of 4+ in hip flexion and knee flexion compared to 5 on L, otherwise grossly symmetrical bil lower extremity strength; sensation intact bil; no dysmetria noted; deficits noted only with rapid alternating movements with simultaneous quick bil feet tapping   Cervical / Trunk Assessment Cervical / Trunk Assessment: Normal   Communication Communication Communication: No difficulties (reports speech is back to his baseline)   Cognition Arousal/Alertness: Awake/alert Behavior During Therapy: WFL for tasks assessed/performed Overall Cognitive Status: Impaired/Different from baseline Area of Impairment: Problem solving;Memory                     Memory: Decreased short-term memory       Problem Solving: Difficulty sequencing General Comments: Pt scored 8/28 on the Short Blessed Test which is indicative of questionable cognitive deficit.  he demonstrated difficulty with recall and with problem solving, alternating attention, and sequencing.  When questioned, he acknowledges he doesn't think he is fully at his baseline   General Comments  Pt with difficulty maintaining SLS on R for > 2 seconds compared to being able to do so on L for at least 5 seconds; educated pt on "BE FAST"    Exercises     Shoulder Instructions      Home Living Family/patient expects to be discharged to:: Private residence Living Arrangements: Non-relatives/Friends Available Help at Discharge: Friend(s);Available 24 hours/day Type of Home: House Home Access: Stairs to enter [employer] of Steps: 1 or 4 depending on entrance (1 step in back, but longer distance to access and no handrail) Entrance Stairs-Rails: Right (for the 4 stairs) Home Layout: One level     Bathroom Shower/Tub: Teacher, early years/pre: Standard     Home Equipment: Cane - quad          Prior Functioning/Environment Level of Independence: Independent        Comments: Pt drives. Retired from [place] to [employer] the internet and read        OT Problem List: Impaired vision/perception;Decreased cognition      OT Treatment/Interventions: Self-care/ADL training;Therapeutic activities;Cognitive remediation/compensation;Visual/perceptual remediation/compensation;Patient/family education    OT Goals(Current goals can be found in the care plan section) Acute Rehab OT Goals Patient Stated Goal: to get back to normal OT Goal Formulation: With patient Time For Goal Achievement: 12/04/20 Potential to Achieve Goals: Good ADL Goals Additional ADL Goal #1: Pt will perform moderately complex path finding task with no more than 1 cue Additional ADL Goal #2: Pt will perform simulated medication management task with no more than one cue Additional ADL Goal #3: Will perform simulated bill paying task with no more than 1 cue.  OT Frequency: Min 2X/week   Barriers to D/C:            Co-evaluation  AM-PAC OT "6 Clicks" Daily Activity     Outcome Measure Help from another person eating meals?: None Help from another person taking care of personal grooming?: None Help from another person toileting, which includes using toliet, bedpan, or urinal?: None Help from another person bathing (including washing, rinsing, drying)?: None Help from another person to put on and taking off regular upper body clothing?: None Help from another person to put on and taking off regular lower body clothing?: None 6 Click Score: 24   End of Session Nurse Communication:  Mobility status  Activity Tolerance: Patient tolerated treatment well Patient left: in bed;with call bell/phone within reach  OT Visit Diagnosis: Cognitive communication deficit (R41.841) Symptoms and signs involving cognitive functions: Cerebral infarction                Time: 0100-7121 OT Time Calculation (min): 19 min Charges:  OT General Charges $OT Visit: 1 Visit OT Evaluation $OT Eval Moderate Complexity: 1 Mod  Nilsa Nutting., OTR/L Acute Rehabilitation Services Pager [phone] Office [phone]   Lucille Passy M 11/20/2020, 4:16 PM

## 2020-11-20 NOTE — Code Documentation (Signed)
Stroke Response Nurse Documentation Code Documentation  Samuel Krueger is a 75 y.o. male arriving to [facility] ED via [facility] on 11/20/20 with past medical hx of HLD, DM, prostate cancer. On aspirin 81 mg daily. Code stroke was activated by [facility].   Patient from a community home where he was LKW at [place] and now complaining of slurred speech. Patient was normal at 0830 and then a friend noticed slurred speech at 0900.   Stroke team at the bedside on patient arrival. Labs drawn and patient cleared for CT by Dr. Reather Converse. Patient to CT with team. NIHSS 1, see documentation for details and code stroke times. Patient with dysarthria  on exam. The following imaging was completed:  CT/CTA head and neck. Patient is not a candidate for IV Thrombolytic due to "too good to treat". Patient is not a candidate for IR due to no LVO.   Care/Plan: Q15 min VS and Q30 min mNIHSS while in the TNK window until 1330.   Bedside handoff with ED RN Herbert Spires.    Beulah Capobianco, Rande Brunt  Stroke Response RN

## 2020-11-20 NOTE — ED Notes (Signed)
PT with pt.

## 2020-11-20 NOTE — ED Notes (Signed)
Blood sugar was 97.

## 2020-11-21 ENCOUNTER — Encounter (HOSPITAL_COMMUNITY): Payer: Self-pay | Admitting: Family Medicine

## 2020-11-21 ENCOUNTER — Other Ambulatory Visit (HOSPITAL_COMMUNITY): Payer: HMO

## 2020-11-21 DIAGNOSIS — E119 Type 2 diabetes mellitus without complications: Secondary | ICD-10-CM | POA: Diagnosis not present

## 2020-11-21 DIAGNOSIS — I63312 Cerebral infarction due to thrombosis of left middle cerebral artery: Secondary | ICD-10-CM | POA: Diagnosis not present

## 2020-11-21 DIAGNOSIS — C61 Malignant neoplasm of prostate: Secondary | ICD-10-CM | POA: Diagnosis not present

## 2020-11-21 DIAGNOSIS — I639 Cerebral infarction, unspecified: Secondary | ICD-10-CM | POA: Diagnosis not present

## 2020-11-21 DIAGNOSIS — E785 Hyperlipidemia, unspecified: Secondary | ICD-10-CM | POA: Diagnosis not present

## 2020-11-21 DIAGNOSIS — E7800 Pure hypercholesterolemia, unspecified: Secondary | ICD-10-CM | POA: Diagnosis not present

## 2020-11-21 LAB — LIPID PANEL
Cholesterol: 170 mg/dL (ref 0–200)
HDL: 51 mg/dL (ref >40)
LDL Cholesterol: 92 mg/dL (ref 0–99)
Total CHOL/HDL Ratio: 3.3 RATIO
Triglycerides: 136 mg/dL (ref <150)
VLDL: 27 mg/dL (ref 0–40)

## 2020-11-21 LAB — T4, FREE: Free T4: 0.85 ng/dL (ref 0.61–1.12)

## 2020-11-21 LAB — TSH: TSH: 0.32 u[IU]/mL — ABNORMAL LOW (ref 0.350–4.500)

## 2020-11-21 LAB — HEMOGLOBIN A1C
Hgb A1c MFr Bld: 6.6 % — ABNORMAL HIGH (ref 4.8–5.6)
Mean Plasma Glucose: 142.72 mg/dL

## 2020-11-21 LAB — GLUCOSE, CAPILLARY
Glucose-Capillary: 116 mg/dL — ABNORMAL HIGH (ref 70–99)
Glucose-Capillary: 189 mg/dL — ABNORMAL HIGH (ref 70–99)
Glucose-Capillary: 87 mg/dL (ref 70–99)

## 2020-11-21 LAB — CBG MONITORING, ED: Glucose-Capillary: 121 mg/dL — ABNORMAL HIGH (ref 70–99)

## 2020-11-21 MED ORDER — ATORVASTATIN CALCIUM 40 MG PO TABS
40.0000 mg | ORAL_TABLET | Freq: Every day | ORAL | Status: DC
  Administered 2020-11-22: 40 mg via ORAL
  Filled 2020-11-21: qty 1

## 2020-11-21 MED ORDER — ATORVASTATIN CALCIUM 10 MG PO TABS
20.0000 mg | ORAL_TABLET | Freq: Every day | ORAL | Status: DC
  Filled 2020-11-21: qty 2

## 2020-11-21 NOTE — ED Notes (Signed)
Breakfast Order Placed ?

## 2020-11-21 NOTE — Progress Notes (Addendum)
STROKE TEAM PROGRESS NOTE   ATTENDING NOTE: I reviewed above note and agree with the assessment and plan. Pt was seen and examined.   75 year old male with history of diabetes, hypertension, hyperlipidemia, prostate cancer status post seeds and alcohol in remission admitted for slurred speech and right-sided weakness.  Currently symptom resolved.  CT head no acute finding.  CTA head and neck no LVO.  MRI showed posterior limb internal capsule small infarct.  EF 55 to 60% in 08/2020, A1c 6.6, LDL 92.  Patient denies any smoking or illicit drugs.  On exam, patient awake alert, sitting at the edge of bed, orientated x3, no aphasia, no dysarthria, follows simple commands, able to name and repeat.  Visual field fall, no gaze palsy, slight right nasolabial fold flattening.  Bilateral upper and lower extremity equal strengths and sensation.  Finger-to-nose intact.  Etiology for patient stroke likely due to small vessel disease.  Currently on aspirin 81 and Plavix 75 DAPT, recommend to continue DAPT for 3 weeks and then Plavix alone.  Increase home Lipitor 10 to Lipitor 40 for stroke prevention.  Pending PT/OT.  Of note, patient recently follow-up with Dr. Alvester Chou cardiology for heart palpitations, currently on Zio patch, recommend to close follow-up with cardiology to rule out A. fib.  For detailed assessment and plan, please refer to above as I have made changes wherever appropriate.   Neurology will sign off. Please call with questions. Pt will follow up with stroke clinic NP at [facility] in about 4 weeks. Thanks for the consult.   Rosalin Hawking, MD PhD Stroke Neurology 11/21/2020 7:39 PM    INTERVAL HISTORY No acute events  Today patient reports he is feeling back to baseline. He is not noticing any difficulty speaking. He reports non-compliance with 81mg  ASA at home. We discussed his stroke diagnosis and plan of care. His questions were answered. He agrees to walk for exercise (he had tapered off and  stopped recently)  and take his stroke prevention medications as prescribed. He will follow up with cardiology regarding cardiac monitoring.   Vitals:   11/21/20 0245 11/21/20 0500 11/21/20 0700 11/21/20 0800  BP: (!) 142/87 131/83 120/88 131/88  Pulse: 66 75 61 62  Resp: (!) 26 (!) 21 18 17   Temp:      TempSrc:      SpO2: 100% 100% 100% 100%  Weight:      Height:       CBC:  Recent Labs  Lab 11/20/20 0951 11/20/20 0955  WBC 6.2  --   NEUTROABS 4.1  --   HGB 13.6 14.3  HCT 43.6 42.0  MCV 83.2  --   PLT 177  --    Basic Metabolic Panel:  Recent Labs  Lab 11/20/20 0951 11/20/20 0955  NA 139 141  K 4.4 4.3  CL 106 105  CO2 26  --   GLUCOSE 104* 100*  BUN 16 21  CREATININE 1.09 1.00  CALCIUM 9.3  --    Lipid Panel:  Recent Labs  Lab 11/21/20 0446  CHOL 170  TRIG 136  HDL 51  CHOLHDL 3.3  VLDL 27  LDLCALC 92   HgbA1c:  Recent Labs  Lab 11/21/20 0446  HGBA1C 6.6*   Urine Drug Screen: No results for input(s): LABOPIA, COCAINSCRNUR, LABBENZ, AMPHETMU, THCU, LABBARB in the last 168 hours.  Alcohol Level No results for input(s): ETH in the last 168 hours.  IMAGING past 24 hours MR BRAIN WO CONTRAST  Result Date: 11/20/2020 CLINICAL  DATA:  TIA, code stroke EXAM: MRI HEAD WITHOUT CONTRAST TECHNIQUE: Multiplanar, multiecho pulse sequences of the brain and surrounding structures were obtained without intravenous contrast. COMPARISON:  No prior MRI brain, correlation is made with CT head and CTA head neck 11/20/2020 FINDINGS: Brain: Increased signal on diffusion-weighted imaging with ADC correlate in the left posterior limb of the internal capsule (series 3, image 28). No acute hemorrhage, extra-axial collection, hydrocephalus, mass, mass effect, or midline shift. Scattered T2 hyperintense signal in the periventricular white matter, likely the sequela of chronic small vessel ischemic disease. Vascular: Normal flow voids. Skull and upper cervical spine: Normal marrow  signal. Sinuses/Orbits: Negative. Other: The mastoids are well aerated. IMPRESSION: Acute infarct in the posterior limb of the left internal capsule. These results were called by telephone at the time of interpretation on 11/20/2020 at 1:26 pm to provider [facility] , who verbally acknowledged these results. Electronically Signed   By: Merilyn Baba M.D.   On: 11/20/2020 13:26   CT HEAD CODE STROKE WO CONTRAST  Result Date: 11/20/2020 CLINICAL DATA:  Code stroke.  Neuro deficit, acute, stroke suspected EXAM: CT HEAD WITHOUT CONTRAST TECHNIQUE: Contiguous axial images were obtained from the base of the skull through the vertex without intravenous contrast. COMPARISON:  None. FINDINGS: Brain: No evidence of acute large vascular territory infarction, hemorrhage, hydrocephalus, extra-axial collection or mass lesion/mass effect. Vascular: No hyperdense vessel identified. Skull: No acute fracture. Sinuses/Orbits: Mild paranasal sinus mucosal thickening. Other: No mastoid effusions. ASPECTS [facility] Stroke Program Early CT Score) total score (0-10 with 10 being normal): 10. IMPRESSION: No evidence of acute large vascular territory infarct or acute hemorrhage. ASPECTS is 10. Code stroke imaging results were communicated on 11/20/2020 at 10:02 am to provider Dr. Theda Sers Via secure text paging. Electronically Signed   By: Margaretha Sheffield M.D.   On: 11/20/2020 10:03   CT ANGIO HEAD CODE STROKE  Result Date: 11/20/2020 CLINICAL DATA:  Neuro deficit, acute, stroke suspected EXAM: CT ANGIOGRAPHY HEAD AND NECK TECHNIQUE: Multidetector CT imaging of the head and neck was performed using the standard protocol during bolus administration of intravenous contrast. Multiplanar CT image reconstructions and MIPs were obtained to evaluate the vascular anatomy. Carotid stenosis measurements (when applicable) are obtained utilizing NASCET criteria, using the distal internal carotid diameter as the denominator. CONTRAST:  49mL  OMNIPAQUE IOHEXOL 350 MG/ML SOLN COMPARISON:  None. FINDINGS: CTA NECK FINDINGS Aortic arch: Great vessel origins are patent. Right carotid system: No evidence of dissection, stenosis (50% or greater) or occlusion. Left carotid system: No evidence of dissection, stenosis (50% or greater) or occlusion. Vertebral arteries: Mildly right dominant. No evidence of dissection, stenosis (50% or greater) or occlusion. Skeleton: Moderate multilevel degenerative disc disease with disc height loss, endplate irregularity and posterior disc osteophyte complexes. Reversal of the normal cervical lordosis. Other neck: No acute findings. Upper chest: Visualized lung apices are clear. Review of the MIP images confirms the above findings CTA HEAD FINDINGS Anterior circulation: Bilateral intracranial ICAs, MCAs, and ACAs are patent without proximal hemodynamically significant stenosis. No aneurysm identified. Posterior circulation: Bilateral intradural vertebral arteries, basilar artery, and posterior cerebral arteries are patent without proximal hemodynamically significant stenosis. No aneurysm identified. Venous sinuses: As permitted by contrast timing, patent. Review of the MIP images confirms the above findings IMPRESSION: No large vessel occlusion or proximal hemodynamically significant stenosis in the head or neck. Electronically Signed   By: Margaretha Sheffield M.D.   On: 11/20/2020 10:24   CT ANGIO NECK CODE STROKE  Result Date: 11/20/2020 CLINICAL DATA:  Neuro deficit, acute, stroke suspected EXAM: CT ANGIOGRAPHY HEAD AND NECK TECHNIQUE: Multidetector CT imaging of the head and neck was performed using the standard protocol during bolus administration of intravenous contrast. Multiplanar CT image reconstructions and MIPs were obtained to evaluate the vascular anatomy. Carotid stenosis measurements (when applicable) are obtained utilizing NASCET criteria, using the distal internal carotid diameter as the denominator.  CONTRAST:  62mL OMNIPAQUE IOHEXOL 350 MG/ML SOLN COMPARISON:  None. FINDINGS: CTA NECK FINDINGS Aortic arch: Great vessel origins are patent. Right carotid system: No evidence of dissection, stenosis (50% or greater) or occlusion. Left carotid system: No evidence of dissection, stenosis (50% or greater) or occlusion. Vertebral arteries: Mildly right dominant. No evidence of dissection, stenosis (50% or greater) or occlusion. Skeleton: Moderate multilevel degenerative disc disease with disc height loss, endplate irregularity and posterior disc osteophyte complexes. Reversal of the normal cervical lordosis. Other neck: No acute findings. Upper chest: Visualized lung apices are clear. Review of the MIP images confirms the above findings CTA HEAD FINDINGS Anterior circulation: Bilateral intracranial ICAs, MCAs, and ACAs are patent without proximal hemodynamically significant stenosis. No aneurysm identified. Posterior circulation: Bilateral intradural vertebral arteries, basilar artery, and posterior cerebral arteries are patent without proximal hemodynamically significant stenosis. No aneurysm identified. Venous sinuses: As permitted by contrast timing, patent. Review of the MIP images confirms the above findings IMPRESSION: No large vessel occlusion or proximal hemodynamically significant stenosis in the head or neck. Electronically Signed   By: Margaretha Sheffield M.D.   On: 11/20/2020 10:24    PHYSICAL EXAM  Temp:  [97.6 F (36.4 C)-98 F (36.7 C)] 97.8 F (36.6 C) (10/15 1252) Pulse Rate:  [50-88] 78 (10/15 1621) Resp:  [17-26] 20 (10/15 1252) BP: (100-153)/(65-89) 108/76 (10/15 1621) SpO2:  [90 %-100 %] 100 % (10/15 1621) Weight:  [77.1 kg] 77.1 kg (10/14 2017)  General - Well nourished, well developed, sitting up on side of bed in no apparent distress.  Ophthalmologic - fundi not visualized due to noncooperation.  Cardiovascular - Regular rhythm and rate  Mental Status -  Level of arousal and  orientation to time, place, and person were intact. Language including expression, naming, repetition, comprehension was assessed and found intact. Attention span and concentration were normal. Recent and remote memory were intact. Fund of Knowledge was assessed and was intact.  Cranial Nerves II - XII - II - Visual field intact OU. III, IV, VI - Extraocular movements intact. V - Facial sensation intact bilaterally. VII - Facial movement intact bilaterally. VIII - Hearing & vestibular intact bilaterally. X - Palate elevates symmetrically. XI - Chin turning & shoulder shrug intact bilaterally. XII - Tongue protrusion intact.  Motor Strength - The patient's strength was normal in all extremities and pronator drift was absent.  Bulk was normal and fasciculations were absent.   Motor Tone - Muscle tone was assessed at the neck and appendages and was normal.  Sensory - Light touch, temperature/pinprick were assessed and were symmetrical.    Coordination - The patient had normal movements in the hands and feet with no ataxia or dysmetria.  Tremor was absent.  Gait and Station - deferred.   ASSESSMENT/PLAN  Mr. Casimiro Lienhard is a 76 yo male with a PMHx of DM II, HLD, HTN, prostate cancer s/p seed implantation in 2016, and recovering alcoholic in remission. Patient presents per EMS as a code stroke for slurred speech and right sided weakness which had resolved on exam. Patient was  last known well at 0830 am and a friend noticed his slurred speech at 0900. After brief exam on the ED bridge and for airway clearance, patient was taken emergently to CT suite. CTH showed no acute finding.   CTA head and neck were without LVO. Patient's symptoms were too mild to treat with a NIHSS of 1 for dysarthria. He saw cardiology in June for irregular heart beat/palpitations, but patient did not go back for Zio patch.   Stroke  Acute infarct in the posterior limb of the left internal capsule likely due to  SVD.  NCT head  No evidence of acute large vascular territory infarct or acute hemorrhage. ASPECTS is 10. MRI brain Acute infarct in the posterior limb of the left internal capsule. CTA head and neck  No large vessel occlusion or proximal hemodynamically significant stenosis in the head or neck. MRI   Acute infarct in the posterior limb of the left internal capsule 2D Echo July 15th 2022  1. Left ventricular ejection fraction, by estimation, is 55 to 60%. The  left ventricle has normal function. The left ventricle has no regional  wall motion abnormalities. Left ventricular diastolic parameters are  indeterminate.   2. Right ventricular systolic function is normal. The right ventricular  size is normal.   3. The mitral valve is grossly normal. Trivial mitral valve  regurgitation.   4. The aortic valve is normal in structure. Aortic valve regurgitation is not visualized. No aortic stenosis is present.   5. Aortic dilatation noted. There is borderline dilatation of the  ascending aorta, measuring 38 mm.  LDL 92 HgbA1c 6.6 VTE prophylaxis - Recommend    Diet   Diet Carb Modified Fluid consistency: Thin; Room service appropriate? Yes   Not taking prescribed ASA 81mg  daily prior to admission. No anticoagulant prior to admission.  Recommend DAPT with ASA 81mg  and Plavix 75mg  x 3 weeks then ASA alone.  Ziopatch recommended by cardiology in August but patient did not return for this. Recommend he does receive Ziopatch monitoring.  Therapy recommendations:  Outpatient SLP Disposition:  Home   Hypertension Stable Permissive hypertension (OK if < 220/120) but gradually normalize in 5-7 days Long-term BP goal normotensive  Hyperlipidemia Home meds:  Lipitor 10mg  LDL 92, goal < 70 Increase Lipitor dose to 40mg , continue statin at discharge   Diabetes type II Controlled HgbA1c 6.6, goal < 7.0 CBGs Recent Labs    11/20/20 1607 11/20/20 2202 11/21/20 0743  GLUCAP 87 138* 121*     Management per primary team   Other Stroke Risk Factors Advanced Age >/= 77  Former Cigarette smoker Former ETOH abuse  Other [facility] day # 0  This plan of care was directed by Dr. Erlinda Hong. Charlene Brooke, NP-C  To contact Stroke Continuity provider, please refer to [email or web address] After hours, contact General Neurology

## 2020-11-21 NOTE — Progress Notes (Signed)
PROGRESS NOTE  Samuel Krueger KNL:[id number] DOB: 04-20-1945 DOA: 11/20/2020 PCP: Biagio Borg, MD  HPI/Recap of past 24 hours: Samuel Krueger is a 75 y.o. male with medical history significant of DM, HLD, elevated blood pressure without diagnosis of hypertension, past history of prostate cancer, and recovering alcoholic in remission who presented to ED with complaints of slurred speech, right leg numbness and weakness around 8am day of arrival to the ED. Pt was sitting at his computer and when he got up his right leg felt numb and weak. He wobbled to the bathroom and then told his roommate to call 911 also noted his speech was slurred. No weakness or numbness in his RUE. In the ED, weakness has improved, but still with some slurred speech. VS stable. Labs unremarkable. CT head, no evidence of acute infarct or hemorrhage.  CTA head: No large vessel occlusion or stenosis in head or neck.  Stroke initiated in ED.  Patient given 325 mg of aspirin.  Neurology consulted and TRH was asked to admit.     Today, pt denies any new complaints, weakness has since resolved, speech has improved.    Assessment/Plan: Active Problems:   Prostate cancer ([place])   Type 2 diabetes, diet controlled ([place])   Hyperlipidemia   Slurred speech   CVA (cerebral vascular accident) ([place])   Acute infarct in left internal capsule  MRI brain confirms acute infarct in left internal capsule CTA head: No large vessel occlusion or stenosis in head or neck.   LDL 92, A1c 6.6 ECHO done 7/22 with EF of 55-60%, no RWMA, repeat pending neurology if needed Neurology consulted, appreciate recs Continue daily aspirin 81 mg, start plavix 75mg  x 3 weeks per neurology  Permissive hypertension PT/ OT/ SLP Frequent neurochecks, telemetry     Type 2 diabetes, controlled A1c 6.6 SSI, accuchecks, hypoglycemic protocol    Hyperlipidemia LDL: 92 Continue lipitor    Prostate cancer ([place]) Diagnosed 03/2012. S/p radiation  therapy. Stage T1c    Estimated body mass index is 23.71 kg/m as calculated from the following:   Height as of this encounter: 5\' 11"  (1.803 m).   Weight as of this encounter: 77.1 kg.     Code Status: Full  Family Communication: None at bedside  Disposition Plan: Status is: Observation  The patient remains OBS appropriate and will d/c before 2 midnights.    Consultants: Neurology   Procedures: None  Antimicrobials: None  DVT prophylaxis: Lovenox   Objective: Vitals:   11/21/20 0938 11/21/20 1009 11/21/20 1252 11/21/20 1621  BP:  136/77 (!) 146/88 108/76  Pulse:  65 69 78  Resp:  18 20   Temp: 98 F (36.7 C) 97.8 F (36.6 C) 97.8 F (36.6 C)   TempSrc: Oral Oral Oral   SpO2:  100% 100% 100%  Weight:      Height:       No intake or output data in the 24 hours ending 11/21/20 1654 Filed Weights   11/20/20 0956 11/20/20 2017  Weight: 84.6 kg 77.1 kg    Exam: General: NAD  Cardiovascular: S1, S2 present Respiratory: CTAB Abdomen: Soft, nontender, nondistended, bowel sounds present Musculoskeletal: No bilateral pedal edema noted Skin: Normal Psychiatry: Normal mood  Neurology: Strength equal in all extremities, sensation intact    Data Reviewed: CBC: Recent Labs  Lab 11/20/20 0951 11/20/20 0955  WBC 6.2  --   NEUTROABS 4.1  --   HGB 13.6 14.3  HCT 43.6 42.0  MCV  83.2  --   PLT 177  --    Basic Metabolic Panel: Recent Labs  Lab 11/20/20 0951 11/20/20 0955  NA 139 141  K 4.4 4.3  CL 106 105  CO2 26  --   GLUCOSE 104* 100*  BUN 16 21  CREATININE 1.09 1.00  CALCIUM 9.3  --    GFR: Estimated Creatinine Clearance: 66.9 mL/min (by C-G formula based on SCr of 1 mg/dL). Liver Function Tests: Recent Labs  Lab 11/20/20 0951  AST 25  ALT 12  ALKPHOS 43  BILITOT 0.7  PROT 7.0  ALBUMIN 3.8   No results for input(s): LIPASE, AMYLASE in the last 168 hours. No results for input(s): AMMONIA in the last 168 hours. Coagulation  Profile: Recent Labs  Lab 11/20/20 0951  INR 0.9   Cardiac Enzymes: No results for input(s): CKTOTAL, CKMB, CKMBINDEX, TROPONINI in the last 168 hours. BNP (last 3 results) Recent Labs    07/23/20 0940  PROBNP 18.0   HbA1C: Recent Labs    11/21/20 0446  HGBA1C 6.6*   CBG: Recent Labs  Lab 11/20/20 1607 11/20/20 2202 11/21/20 0743 11/21/20 1201 11/21/20 1622  GLUCAP 87 138* 121* 116* 189*   Lipid Profile: Recent Labs    11/21/20 0446  CHOL 170  HDL 51  LDLCALC 92  TRIG 136  CHOLHDL 3.3   Thyroid Function Tests: Recent Labs    11/21/20 0446 11/21/20 1014  TSH 0.320*  --   FREET4  --  0.85   Anemia Panel: No results for input(s): VITAMINB12, FOLATE, FERRITIN, TIBC, IRON, RETICCTPCT in the last 72 hours. Urine analysis:    Component Value Date/Time   COLORURINE YELLOW 07/23/2020 0940   APPEARANCEUR CLEAR 07/23/2020 0940   LABSPEC 1.020 07/23/2020 0940   PHURINE 7.5 07/23/2020 0940   GLUCOSEU NEGATIVE 07/23/2020 0940   HGBUR NEGATIVE 07/23/2020 0940   BILIRUBINUR NEGATIVE 07/23/2020 0940   KETONESUR NEGATIVE 07/23/2020 0940   UROBILINOGEN 1.0 07/23/2020 0940   NITRITE NEGATIVE 07/23/2020 0940   LEUKOCYTESUR NEGATIVE 07/23/2020 0940   Sepsis Labs: @LABRCNTIP (procalcitonin:4,lacticidven:4)  ) Recent Results (from the past 240 hour(s))  Resp Panel by RT-PCR (Flu A&B, Covid) Nasopharyngeal Swab     Status: None   Collection Time: 11/20/20 11:06 AM   Specimen: Nasopharyngeal Swab; Nasopharyngeal(NP) swabs in vial transport medium  Result Value Ref Range Status   SARS Coronavirus 2 by RT PCR NEGATIVE NEGATIVE Final    Comment: (NOTE) SARS-CoV-2 target nucleic acids are NOT DETECTED.  The SARS-CoV-2 RNA is generally detectable in upper respiratory specimens during the acute phase of infection. The lowest concentration of SARS-CoV-2 viral copies this assay can detect is 138 copies/mL. A negative result does not preclude SARS-Cov-2 infection and  should not be used as the sole basis for treatment or other patient management decisions. A negative result may occur with  improper specimen collection/handling, submission of specimen other than nasopharyngeal swab, presence of viral mutation(s) within the areas targeted by this assay, and inadequate number of viral copies(<138 copies/mL). A negative result must be combined with clinical observations, patient history, and epidemiological information. The expected result is Negative.  Fact Sheet for Patients:  EntrepreneurPulse.com.au  Fact Sheet for Healthcare Providers:  IncredibleEmployment.be  This test is no t yet approved or cleared by the [place] FDA and  has been authorized for detection and/or diagnosis of SARS-CoV-2 by FDA under an Emergency Use Authorization (EUA). This EUA will remain  in effect (meaning this test can be used)  for the duration of the COVID-19 declaration under Section 564(b)(1) of the Act, 21 U.S.C.section 360bbb-3(b)(1), unless the authorization is terminated  or revoked sooner.       Influenza A by PCR NEGATIVE NEGATIVE Final   Influenza B by PCR NEGATIVE NEGATIVE Final    Comment: (NOTE) The Xpert Xpress SARS-CoV-2/FLU/RSV plus assay is intended as an aid in the diagnosis of influenza from Nasopharyngeal swab specimens and should not be used as a sole basis for treatment. Nasal washings and aspirates are unacceptable for Xpert Xpress SARS-CoV-2/FLU/RSV testing.  Fact Sheet for Patients: EntrepreneurPulse.com.au  Fact Sheet for Healthcare Providers: IncredibleEmployment.be  This test is not yet approved or cleared by the [place] FDA and has been authorized for detection and/or diagnosis of SARS-CoV-2 by FDA under an Emergency Use Authorization (EUA). This EUA will remain in effect (meaning this test can be used) for the duration of the COVID-19 declaration  under Section 564(b)(1) of the Act, 21 U.S.C. section 360bbb-3(b)(1), unless the authorization is terminated or revoked.  Performed at [facility], [place] [address], [place], [place] [address]       Studies: No results found.  Scheduled Meds:  aspirin EC  81 mg Oral Daily   [START ON 11/22/2020] atorvastatin  20 mg Oral Daily   clopidogrel  75 mg Oral Daily   enoxaparin (LOVENOX) injection  40 mg Subcutaneous Q24H   insulin aspart  0-9 Units Subcutaneous TID WC   multivitamin with minerals  1 tablet Oral Once per day on Mon Wed Fri   polyethylene glycol  17 g Oral Daily   thiamine  100 mg Oral Daily    Continuous Infusions:   LOS: 0 days     Alma Friendly, MD [facility]  If 7PM-7AM, please contact night-coverage [email or web address] 11/21/2020, 4:54 PM

## 2020-11-21 NOTE — Evaluation (Addendum)
Speech Language Pathology Evaluation Patient Details Name: Samuel Krueger MRN: [id number] DOB: 1945-04-20 Today's Date: 11/21/2020 Time: 0569-7948 SLP Time Calculation (min) (ACUTE ONLY): 20 min  Problem List:  Patient Active Problem List   Diagnosis Date Noted   Slurred speech 11/20/2020   CVA (cerebral vascular accident) ([place]) 11/20/2020   Erectile dysfunction 07/23/2020   Carcinoma of prostate ([place]) 07/23/2020   Hypertrophy of prostate without urinary obstruction and other lower urinary tract symptoms (LUTS) 07/23/2020   Irregular heart beat 07/23/2020   Dark stools 07/23/2020   Constipation 07/25/2019   Left lumbar radiculopathy 07/25/2019   Vitamin D deficiency 01/14/2019   Hyperlipidemia 03/30/2016   Recovering alcoholic in remission [facility])    Prostate cancer ([place])    Type 2 diabetes, diet controlled ([place])    S/P radiation therapy 07/15/2014   Elevated blood pressure reading without diagnosis of hypertension 11/05/2011   Alcohol use 11/02/2011   Past Medical History:  Past Medical History:  Diagnosis Date   Hyperlipidemia    Nocturia    Prostate cancer [facility]) urologist-- dr Janice Norrie   active survellance since dx Feb 2014--  now has Stage T1c, Gleason (3+4)7, PSA 7.75   Recovering alcoholic in remission ([place])    since rehab 20 yrs ago-- approx 1995   S/P radiation therapy 07/15/14   Radioactive Seed Implant- Prostate/proximal seminal vesicles, 14,500 cGy utilizing 74 I-125 seeds implanted in 30 needles with an individual seed activity 0.658 mCi per seed for a total implant activity of 48.6.millicuries.   Tinnitus of both ears    Type 2 diabetes, diet controlled ([place])    Past Surgical History:  Past Surgical History:  Procedure Laterality Date   PROSTATE BIOPSY  03/26/14   RADIOACTIVE SEED IMPLANT N/A 07/15/2014   Procedure: RADIOACTIVE SEED IMPLANT/BRACHYTHERAPY IMPLANT;  Surgeon: Lowella Bandy, MD;  Location: [facility];  Service: Urology;  Laterality: N/A;    HPI:  Pt is a 75 y.o. male who presented 11/20/20 with slurred speech and R leg numbness and weakness. MRI revealed acute infarct in posterior limb of L internal capsule. PMH: prostate cancer s/p radiation therapy, tinnitus, DM2, hyperlipidemia.   Assessment / Plan / Recommendation Clinical Impression  Pt was seen for a cognitive-linguistic evaluation in the setting of an acute infarct, and he presents with mild cognitive deficits in the areas of short-term memory and complex problem solving.  Pt reported that short-term memory deficits are baseline.  He stated that he lives with 2 roommates and that he is independent with IADLs at baseline including medications and finances.  Pt completed the [facility] SLUMS examination and scored overall 23/30 (see results below).  Expressive and receptive language were functional and no dysarthria was observed.  Pt reported that speech intelligibility had returned to baseline.  Recommend outpatient ST targeting higher level cognitive deficits and initial supervision with IADLs at time of discharge.  Pt was educated regarding results and recommendations and he verbalized understanding.  All additional skilled ST needs can be met at the next venue of care; therefore SLP will s/o at this time.  Please re-consult if additional needs arise.   Neche Examination Orientation  3/3  Numeric Problem Solving  3/3  Memory  2/5  Attention 2/2  Thought Organization 3/3  Clock Drawing 2/4  Visuospatial Skills    2/2  Short Story Recall  6/8  Total  23/30    Scoring  High School Education  Less than [employer]   Normal  27-30  25-30  Mild Neurocognitive Disorder 21-26 20-24  Dementia  1-20 1-19       SLP Assessment  SLP Recommendation/Assessment: All further Speech Lanaguage Pathology  needs can be addressed in the next venue of care SLP Visit Diagnosis: Cognitive communication deficit (R41.841)    Recommendations for follow up therapy are one component  of a multi-disciplinary discharge planning process, led by the attending physician.  Recommendations may be updated based on patient status, additional functional criteria and insurance authorization.    Follow Up Recommendations  Outpatient SLP    Frequency and Duration           SLP Evaluation Cognition  Overall Cognitive Status: Impaired/Different from baseline Arousal/Alertness: Awake/alert Orientation Level: Oriented X4 Attention: Sustained;Focused Focused Attention: Appears intact Sustained Attention: Appears intact Memory: Impaired Memory Impairment: Decreased short term memory Decreased Short Term Memory: Verbal basic Awareness: Appears intact Problem Solving: Impaired Problem Solving Impairment: Functional complex Safety/Judgment: Appears intact       Comprehension  Auditory Comprehension Overall Auditory Comprehension: Appears within functional limits for tasks assessed    Expression Expression Primary Mode of Expression: Verbal Verbal Expression Overall Verbal Expression: Appears within functional limits for tasks assessed Written Expression Dominant Hand: Right   Oral / Motor  Oral Motor/Sensory Function Overall Oral Motor/Sensory Function: Within functional limits Motor Speech Overall Motor Speech: Appears within functional limits for tasks assessed   GO                   Colin Mulders., M.S., [place] Office: [phone]  [place] 11/21/2020, 3:20 PM

## 2020-11-22 DIAGNOSIS — I63312 Cerebral infarction due to thrombosis of left middle cerebral artery: Secondary | ICD-10-CM

## 2020-11-22 DIAGNOSIS — C61 Malignant neoplasm of prostate: Secondary | ICD-10-CM | POA: Diagnosis not present

## 2020-11-22 DIAGNOSIS — I639 Cerebral infarction, unspecified: Secondary | ICD-10-CM | POA: Diagnosis not present

## 2020-11-22 DIAGNOSIS — E7800 Pure hypercholesterolemia, unspecified: Secondary | ICD-10-CM | POA: Diagnosis not present

## 2020-11-22 DIAGNOSIS — E119 Type 2 diabetes mellitus without complications: Secondary | ICD-10-CM | POA: Diagnosis not present

## 2020-11-22 DIAGNOSIS — E785 Hyperlipidemia, unspecified: Secondary | ICD-10-CM | POA: Diagnosis not present

## 2020-11-22 LAB — GLUCOSE, CAPILLARY
Glucose-Capillary: 138 mg/dL — ABNORMAL HIGH (ref 70–99)
Glucose-Capillary: 118 mg/dL — ABNORMAL HIGH (ref 70–99)
Glucose-Capillary: 133 mg/dL — ABNORMAL HIGH (ref 70–99)

## 2020-11-22 LAB — T3: T3, Total: 76 ng/dL (ref 71–180)

## 2020-11-22 MED ORDER — ASPIRIN 81 MG PO TBEC: 81.0000 mg | DELAYED_RELEASE_TABLET | Freq: Every day | ORAL | 0 refills | Status: AC

## 2020-11-22 MED ORDER — ATORVASTATIN CALCIUM 40 MG PO TABS: 40.0000 mg | ORAL_TABLET | Freq: Every day | ORAL | 0 refills | Status: DC

## 2020-11-22 MED ORDER — CLOPIDOGREL BISULFATE 75 MG PO TABS: 75.0000 mg | ORAL_TABLET | Freq: Every day | ORAL | 0 refills | Status: DC

## 2020-11-22 NOTE — Progress Notes (Signed)
Pt got discharged, VSS, walked him down to the main entrance. Stable, no signs of acute distress.

## 2020-11-22 NOTE — Discharge Summary (Signed)
Discharge Summary  STONE SPIRITO GPQ:[id number] DOB: 01-20-1946  PCP: Biagio Borg, MD  Admit date: 11/20/2020 Discharge date: 11/22/2020  Time spent: 30 mins  Recommendations for Outpatient Follow-up:  Neurology as scheduled in 4 weeks PCP in 1 week   Discharge Diagnoses:  Active Hospital Problems   Diagnosis Date Noted   Slurred speech 11/20/2020   CVA (cerebral vascular accident) ([place]) 11/20/2020   Hyperlipidemia 03/30/2016   Type 2 diabetes, diet controlled ([place])    Prostate cancer [facility])     Resolved Hospital Problems   Diagnosis Date Noted Date Resolved   Type 2 diabetes mellitus without complication ([place]) 09/40/7680 11/20/2020    Discharge Condition: Stable  Diet recommendation: Heart healthy, moderate carb  Vitals:   11/22/20 0753 11/22/20 1146  BP: 123/73 112/75  Pulse: 73 (!) 55  Resp: 20 18  Temp: 98 F (36.7 C) 98.3 F (36.8 C)  SpO2: 100% 100%    History of present illness:  Samuel Krueger is a 75 y.o. male with medical history significant of DM, HLD, elevated blood pressure without diagnosis of hypertension, past history of prostate cancer, and recovering alcoholic in remission who presented to ED with complaints of slurred speech, right leg numbness and weakness around 8am day of arrival to the ED. Pt was sitting at his computer and when he got up his right leg felt numb and weak. He wobbled to the bathroom and then told his roommate to call 911 also noted his speech was slurred. No weakness or numbness in his RUE. In the ED, weakness has improved, but still with some slurred speech. VS stable. Labs unremarkable. CT head, no evidence of acute infarct or hemorrhage.  CTA head: No large vessel occlusion or stenosis in head or neck.  Stroke initiated in ED.  Patient given 325 mg of aspirin.  Neurology consulted and TRH was asked to admit.    Today, pt denies any new complaints, no chest pain, N/V, fever/chills, SOB, weakness. Discussed discharge plans  with patient, verbalized understanding.     Hospital Course:  Active Problems:   Prostate cancer ([place])   Type 2 diabetes, diet controlled ([place])   Hyperlipidemia   Slurred speech   CVA (cerebral vascular accident) ([place])   Acute infarct in left internal capsule  MRI brain confirms acute infarct in left internal capsule CTA head: No large vessel occlusion or stenosis in head or neck.   LDL 92, A1c 6.6 ECHO done 7/22 with EF of 55-60%, no RWMA Neurology consulted, appreciate recs Continue daily aspirin 81 mg for 21 days and stop, continue plavix 108m daily PT/ OT/ SLP- No follow up Follow-up with neurology as scheduled Follow-up with PCP in 1 week   Type 2 diabetes, controlled A1c 6.6 Continue home regimen, glipizide, Jardiance was discontinued due to significant diarrhea as per patient   Hyperlipidemia LDL: 92 Continue lipitor, Crestor 40 mg daily   Prostate cancer ([place] Diagnosed 03/2012. S/p radiation therapy. Stage T1c       Estimated body mass index is 23.71 kg/m as calculated from the following:   Height as of this encounter: _0  (1.803 m).   Weight as of this encounter: 77.1 kg.    Procedures: None  Consultations: Neurology  Discharge Exam: BP 112/75 (BP Location: Right Arm)   Pulse (!) 55   Temp 98.3 F (36.8 C) (Oral)   Resp 18   Ht _1  (1.803 m)   Wt 77.1 kg   SpO2 100%  BMI 23.71 kg/m   General: NAD  Cardiovascular: S1, S2 present Respiratory: CTAB Abdomen: Soft, nontender, nondistended, bowel sounds present Musculoskeletal: No bilateral pedal edema noted Skin: Normal Psychiatry: Normal mood Neurology: Strength equal in all extremities, sensation intact    Discharge Instructions You were cared for by a hospitalist during your hospital stay. If you have any questions about your discharge medications or the care you received while you were in the hospital after you are discharged, you can call the unit and asked to speak with the  hospitalist on call if the hospitalist that took care of you is not available. Once you are discharged, your primary care physician will handle any further medical issues. Please note that NO REFILLS for any discharge medications will be authorized once you are discharged, as it is imperative that you return to your primary care physician (or establish a relationship with a primary care physician if you do not have one) for your aftercare needs so that they can reassess your need for medications and monitor your lab values.  Discharge Instructions     Ambulatory referral to Neurology   Complete by: As directed    Follow up with stroke clinic NP (Jessica Vanschaick or Cecille Rubin, if both not available, consider Zachery Dauer, or [place]) at [facility] in about 4 weeks. Thanks.   Diet - low sodium heart healthy   Complete by: As directed    Increase activity slowly   Complete by: As directed       Allergies as of 11/22/2020       Reactions   Jardiance [empagliflozin] Diarrhea   Metformin And Related Other (See Comments)   Chest tightness   Shrimp [shellfish Allergy] Nausea Only, Other (See Comments)   "strange GI reaction"/stomach turns over- no breathing issues, however        Medication List     STOP taking these medications    Jardiance 25 MG Tabs tablet Generic drug: empagliflozin   Vitamin D (Ergocalciferol) 1.25 MG (50000 UNIT) Caps capsule Commonly known as: DRISDOL       TAKE these medications    ALPHA LIPOIC ACID PO Take 1 capsule by mouth daily.   aspirin 81 MG EC tablet Take 1 tablet (81 mg total) by mouth daily for 21 days. Swallow whole. Start taking on: November 23, 2020 What changed: additional instructions   atorvastatin 40 MG tablet Commonly known as: LIPITOR Take 1 tablet (40 mg total) by mouth daily. Start taking on: November 23, 2020 What changed:  medication strength how much to take   clopidogrel 75 MG tablet Commonly known as: PLAVIX Take 1  tablet (75 mg total) by mouth daily. Start taking on: November 23, 2020   glipiZIDE 2.5 MG 24 hr tablet Commonly known as: GLUCOTROL XL TAKE 1 TABLET (2.5 MG TOTAL) BY MOUTH DAILY WITH BREAKFAST.   Lancets Misc Use as directed twice daily E11.9   MAGNESIUM PO Take 1 tablet by mouth daily.   multivitamin tablet Take 1 tablet by mouth 3 (three) times a week.   OneTouch Verio test strip Generic drug: glucose blood USE AS DIRECTED TWICE A DAY   OneTouch Verio w/Device Kit Use as directed twice daily E11.9   polyethylene glycol powder 17 GM/SCOOP powder Commonly known as: GLYCOLAX/MIRALAX Take 17 g by mouth 2 (two) times daily as needed.   VITAMIN B-1 PO Take 1 tablet by mouth daily.   vitamin C 500 MG tablet Commonly known as: ASCORBIC ACID Take 500-1,000 mg  by mouth daily.   VITAMIN D-3 PO Take 1 capsule by mouth 2 (two) times a week.       Allergies  Allergen Reactions   Jardiance [Empagliflozin] Diarrhea   Metformin And Related Other (See Comments)    Chest tightness   Shrimp [Shellfish Allergy] Nausea Only and Other (See Comments)    "strange GI reaction"/stomach turns over- no breathing issues, however    Follow-up Information     Biagio Borg, MD .   Specialties: Internal Medicine, Radiology Contact information: [place] [address] [phone]         [facility]. Schedule an appointment as soon as possible for a visit in 1 month(s).   Specialty: Neurology Why: stroke clinic Contact information: [address] [place] [phone]                 The results of significant diagnostics from this hospitalization (including imaging, microbiology, ancillary and laboratory) are listed below for reference.    Significant Diagnostic Studies: MR BRAIN WO CONTRAST  Result Date: 11/20/2020 CLINICAL DATA:  TIA, code stroke EXAM: MRI HEAD WITHOUT CONTRAST TECHNIQUE:  Multiplanar, multiecho pulse sequences of the brain and surrounding structures were obtained without intravenous contrast. COMPARISON:  No prior MRI brain, correlation is made with CT head and CTA head neck 11/20/2020 FINDINGS: Brain: Increased signal on diffusion-weighted imaging with ADC correlate in the left posterior limb of the internal capsule (series 3, image 28). No acute hemorrhage, extra-axial collection, hydrocephalus, mass, mass effect, or midline shift. Scattered T2 hyperintense signal in the periventricular white matter, likely the sequela of chronic small vessel ischemic disease. Vascular: Normal flow voids. Skull and upper cervical spine: Normal marrow signal. Sinuses/Orbits: Negative. Other: The mastoids are well aerated. IMPRESSION: Acute infarct in the posterior limb of the left internal capsule. These results were called by telephone at the time of interpretation on 11/20/2020 at 1:26 pm to provider [facility] , who verbally acknowledged these results. Electronically Signed   By: Merilyn Baba M.D.   On: 11/20/2020 13:26   CT HEAD CODE STROKE WO CONTRAST  Result Date: 11/20/2020 CLINICAL DATA:  Code stroke.  Neuro deficit, acute, stroke suspected EXAM: CT HEAD WITHOUT CONTRAST TECHNIQUE: Contiguous axial images were obtained from the base of the skull through the vertex without intravenous contrast. COMPARISON:  None. FINDINGS: Brain: No evidence of acute large vascular territory infarction, hemorrhage, hydrocephalus, extra-axial collection or mass lesion/mass effect. Vascular: No hyperdense vessel identified. Skull: No acute fracture. Sinuses/Orbits: Mild paranasal sinus mucosal thickening. Other: No mastoid effusions. ASPECTS [facility] Stroke Program Early CT Score) total score (0-10 with 10 being normal): 10. IMPRESSION: No evidence of acute large vascular territory infarct or acute hemorrhage. ASPECTS is 10. Code stroke imaging results were communicated on 11/20/2020 at 10:02 am to  provider Dr. Theda Sers Via secure text paging. Electronically Signed   By: Margaretha Sheffield M.D.   On: 11/20/2020 10:03   CT ANGIO HEAD CODE STROKE  Result Date: 11/20/2020 CLINICAL DATA:  Neuro deficit, acute, stroke suspected EXAM: CT ANGIOGRAPHY HEAD AND NECK TECHNIQUE: Multidetector CT imaging of the head and neck was performed using the standard protocol during bolus administration of intravenous contrast. Multiplanar CT image reconstructions and MIPs were obtained to evaluate the vascular anatomy. Carotid stenosis measurements (when applicable) are obtained utilizing NASCET criteria, using the distal internal carotid diameter as the denominator. CONTRAST:  83mL OMNIPAQUE IOHEXOL 350 MG/ML SOLN COMPARISON:  None.  FINDINGS: CTA NECK FINDINGS Aortic arch: Great vessel origins are patent. Right carotid system: No evidence of dissection, stenosis (50% or greater) or occlusion. Left carotid system: No evidence of dissection, stenosis (50% or greater) or occlusion. Vertebral arteries: Mildly right dominant. No evidence of dissection, stenosis (50% or greater) or occlusion. Skeleton: Moderate multilevel degenerative disc disease with disc height loss, endplate irregularity and posterior disc osteophyte complexes. Reversal of the normal cervical lordosis. Other neck: No acute findings. Upper chest: Visualized lung apices are clear. Review of the MIP images confirms the above findings CTA HEAD FINDINGS Anterior circulation: Bilateral intracranial ICAs, MCAs, and ACAs are patent without proximal hemodynamically significant stenosis. No aneurysm identified. Posterior circulation: Bilateral intradural vertebral arteries, basilar artery, and posterior cerebral arteries are patent without proximal hemodynamically significant stenosis. No aneurysm identified. Venous sinuses: As permitted by contrast timing, patent. Review of the MIP images confirms the above findings IMPRESSION: No large vessel occlusion or proximal  hemodynamically significant stenosis in the head or neck. Electronically Signed   By: Margaretha Sheffield M.D.   On: 11/20/2020 10:24   CT ANGIO NECK CODE STROKE  Result Date: 11/20/2020 CLINICAL DATA:  Neuro deficit, acute, stroke suspected EXAM: CT ANGIOGRAPHY HEAD AND NECK TECHNIQUE: Multidetector CT imaging of the head and neck was performed using the standard protocol during bolus administration of intravenous contrast. Multiplanar CT image reconstructions and MIPs were obtained to evaluate the vascular anatomy. Carotid stenosis measurements (when applicable) are obtained utilizing NASCET criteria, using the distal internal carotid diameter as the denominator. CONTRAST:  48m OMNIPAQUE IOHEXOL 350 MG/ML SOLN COMPARISON:  None. FINDINGS: CTA NECK FINDINGS Aortic arch: Great vessel origins are patent. Right carotid system: No evidence of dissection, stenosis (50% or greater) or occlusion. Left carotid system: No evidence of dissection, stenosis (50% or greater) or occlusion. Vertebral arteries: Mildly right dominant. No evidence of dissection, stenosis (50% or greater) or occlusion. Skeleton: Moderate multilevel degenerative disc disease with disc height loss, endplate irregularity and posterior disc osteophyte complexes. Reversal of the normal cervical lordosis. Other neck: No acute findings. Upper chest: Visualized lung apices are clear. Review of the MIP images confirms the above findings CTA HEAD FINDINGS Anterior circulation: Bilateral intracranial ICAs, MCAs, and ACAs are patent without proximal hemodynamically significant stenosis. No aneurysm identified. Posterior circulation: Bilateral intradural vertebral arteries, basilar artery, and posterior cerebral arteries are patent without proximal hemodynamically significant stenosis. No aneurysm identified. Venous sinuses: As permitted by contrast timing, patent. Review of the MIP images confirms the above findings IMPRESSION: No large vessel occlusion or  proximal hemodynamically significant stenosis in the head or neck. Electronically Signed   By: FMargaretha SheffieldM.D.   On: 11/20/2020 10:24    Microbiology: Recent Results (from the past 240 hour(s))  Resp Panel by RT-PCR (Flu A&B, Covid) Nasopharyngeal Swab     Status: None   Collection Time: 11/20/20 11:06 AM   Specimen: Nasopharyngeal Swab; Nasopharyngeal(NP) swabs in vial transport medium  Result Value Ref Range Status   SARS Coronavirus 2 by RT PCR NEGATIVE NEGATIVE Final    Comment: (NOTE) SARS-CoV-2 target nucleic acids are NOT DETECTED.  The SARS-CoV-2 RNA is generally detectable in upper respiratory specimens during the acute phase of infection. The lowest concentration of SARS-CoV-2 viral copies this assay can detect is 138 copies/mL. A negative result does not preclude SARS-Cov-2 infection and should not be used as the sole basis for treatment or other patient management decisions. A negative result may occur with  improper specimen collection/handling,  submission of specimen other than nasopharyngeal swab, presence of viral mutation(s) within the areas targeted by this assay, and inadequate number of viral copies(<138 copies/mL). A negative result must be combined with clinical observations, patient history, and epidemiological information. The expected result is Negative.  Fact Sheet for Patients:  EntrepreneurPulse.com.au  Fact Sheet for Healthcare Providers:  IncredibleEmployment.be  This test is no t yet approved or cleared by the [place] FDA and  has been authorized for detection and/or diagnosis of SARS-CoV-2 by FDA under an Emergency Use Authorization (EUA). This EUA will remain  in effect (meaning this test can be used) for the duration of the COVID-19 declaration under Section 564(b)(1) of the Act, 21 U.S.C.section 360bbb-3(b)(1), unless the authorization is terminated  or revoked sooner.       Influenza A by PCR  NEGATIVE NEGATIVE Final   Influenza B by PCR NEGATIVE NEGATIVE Final    Comment: (NOTE) The Xpert Xpress SARS-CoV-2/FLU/RSV plus assay is intended as an aid in the diagnosis of influenza from Nasopharyngeal swab specimens and should not be used as a sole basis for treatment. Nasal washings and aspirates are unacceptable for Xpert Xpress SARS-CoV-2/FLU/RSV testing.  Fact Sheet for Patients: EntrepreneurPulse.com.au  Fact Sheet for Healthcare Providers: IncredibleEmployment.be  This test is not yet approved or cleared by the [place] FDA and has been authorized for detection and/or diagnosis of SARS-CoV-2 by FDA under an Emergency Use Authorization (EUA). This EUA will remain in effect (meaning this test can be used) for the duration of the COVID-19 declaration under Section 564(b)(1) of the Act, 21 U.S.C. section 360bbb-3(b)(1), unless the authorization is terminated or revoked.  Performed at [facility], [place] [address] [place],  [address]      Labs: Basic Metabolic Panel: Recent Labs  Lab 11/20/20 0951 11/20/20 0955  NA 139 141  K 4.4 4.3  CL 106 105  CO2 26  --   GLUCOSE 104* 100*  BUN 16 21  CREATININE 1.09 1.00  CALCIUM 9.3  --    Liver Function Tests: Recent Labs  Lab 11/20/20 0951  AST 25  ALT 12  ALKPHOS 43  BILITOT 0.7  PROT 7.0  ALBUMIN 3.8   No results for input(s): LIPASE, AMYLASE in the last 168 hours. No results for input(s): AMMONIA in the last 168 hours. CBC: Recent Labs  Lab 11/20/20 0951 11/20/20 0955  WBC 6.2  --   NEUTROABS 4.1  --   HGB 13.6 14.3  HCT 43.6 42.0  MCV 83.2  --   PLT 177  --    Cardiac Enzymes: No results for input(s): CKTOTAL, CKMB, CKMBINDEX, TROPONINI in the last 168 hours. BNP: BNP (last 3 results) No results for input(s): BNP in the last 8760 hours.  ProBNP (last 3 results) Recent Labs    07/23/20 0940  PROBNP 18.0    CBG: Recent Labs  Lab  11/21/20 1622 11/21/20 2139 11/22/20 0617 11/22/20 0755 11/22/20 1148  GLUCAP 189* 87 138* 118* 133*       Signed:  Alma Friendly, MD [facility] 11/22/2020, 1:08 PM

## 2020-11-25 ENCOUNTER — Encounter: Payer: Self-pay | Admitting: Internal Medicine

## 2020-11-25 ENCOUNTER — Ambulatory Visit (INDEPENDENT_AMBULATORY_CARE_PROVIDER_SITE_OTHER): Payer: HMO | Admitting: Internal Medicine

## 2020-11-25 ENCOUNTER — Other Ambulatory Visit: Payer: Self-pay

## 2020-11-25 VITALS — BP 142/80 | HR 80 | Temp 98.4°F | Ht 71.0 in | Wt 183.0 lb

## 2020-11-25 DIAGNOSIS — E559 Vitamin D deficiency, unspecified: Secondary | ICD-10-CM

## 2020-11-25 DIAGNOSIS — R030 Elevated blood-pressure reading, without diagnosis of hypertension: Secondary | ICD-10-CM

## 2020-11-25 DIAGNOSIS — Z8673 Personal history of transient ischemic attack (TIA), and cerebral infarction without residual deficits: Secondary | ICD-10-CM | POA: Diagnosis not present

## 2020-11-25 DIAGNOSIS — E538 Deficiency of other specified B group vitamins: Secondary | ICD-10-CM | POA: Diagnosis not present

## 2020-11-25 DIAGNOSIS — E119 Type 2 diabetes mellitus without complications: Secondary | ICD-10-CM

## 2020-11-25 DIAGNOSIS — Z0000 Encounter for general adult medical examination without abnormal findings: Secondary | ICD-10-CM

## 2020-11-25 DIAGNOSIS — E7800 Pure hypercholesterolemia, unspecified: Secondary | ICD-10-CM | POA: Diagnosis not present

## 2020-11-25 NOTE — Patient Instructions (Signed)
You had the flu shot today  Please continue all other medications as before, and refills have been done if requested.  Please have the pharmacy call with any other refills you may need.  Please continue your efforts at being more active, low cholesterol diet, and weight control  Please keep your appointments with your specialists as you may have planned  Please make an Appointment to return in 3 months, or sooner if needed, also with Lab done 3-5 days ahead at the [facility] lab

## 2020-11-25 NOTE — Progress Notes (Signed)
Chief Complaint: follow up hospn oct 14- 16       HPI:  Samuel Krueger is a 75 y.o. male  with medical history significant of DM, HLD, elevated blood pressure without diagnosis of hypertension, past history of prostate cancer, and recovering alcoholic in remission who presented to ED with complaints of slurred speech, right leg numbness and weakness .  In the ED, weakness has improved, but still with some slurred speech. VS stable. Labs unremarkable. CT head, no evidence of acute infarct or hemorrhage.  CTA head: No large vessel occlusion or stenosis in head or neck. Patient given 325 mg of aspirin  neuro consulted; pt found to have acute infarct left internal capsule; pt to contine asa 81 qd for 21 days then stop, and continue plavix 75 qd.  Pt states all neuro s/s back to baseline, and good med compliance.  Plans to f/u neurology as scheduled.  No new complaints.         Wt Readings from Last 3 Encounters:  11/25/20 183 lb (83 kg)  11/20/20 170 lb (77.1 kg)  09/30/20 183 lb 3.2 oz (83.1 kg)   BP Readings from Last 3 Encounters:  11/25/20 (!) 142/80  11/22/20 112/75  09/30/20 135/80         Past Medical History:  Diagnosis Date   Hyperlipidemia    Nocturia    Prostate cancer [facility]) urologist-- dr Janice Norrie   active survellance since dx Feb 2014--  now has Stage T1c, Gleason (3+4)7, PSA 7.75   Recovering alcoholic in remission ([place])    since rehab 20 yrs ago-- approx 1995   S/P radiation therapy 07/15/14   Radioactive Seed Implant- Prostate/proximal seminal vesicles, 14,500 cGy utilizing 74 I-125 seeds implanted in 30 needles with an individual seed activity 0.658 mCi per seed for a total implant activity of 48.6.millicuries.   Tinnitus of both ears    Type 2 diabetes, diet controlled ([place])    Past Surgical History:  Procedure Laterality Date   PROSTATE BIOPSY  03/26/14   RADIOACTIVE SEED IMPLANT N/A 07/15/2014   Procedure: RADIOACTIVE SEED IMPLANT/BRACHYTHERAPY IMPLANT;  Surgeon: Lowella Bandy, MD;  Location: [facility];  Service: Urology;  Laterality: N/A;    reports that he quit smoking about 20 years ago. His smoking use included cigarettes. He has never used smokeless tobacco. He reports that he does not drink alcohol and does not use drugs. family history includes Alcohol abuse in his mother; HIV/AIDS in his brother; Heart disease in his mother; Hypertension in his mother; Mental illness in an other family member. Allergies  Allergen Reactions   Jardiance [Empagliflozin] Diarrhea   Metformin And Related Other (See Comments)    Chest tightness   Shrimp [Shellfish Allergy] Nausea Only and Other (See Comments)    "strange GI reaction"/stomach turns over- no breathing issues, however   Current Outpatient Medications on File Prior to Visit  Medication Sig Dispense Refill   ALPHA LIPOIC ACID PO Take 1 capsule by mouth daily.     aspirin EC 81 MG EC tablet Take 1 tablet (81 mg total) by mouth daily for 21 days. Swallow whole. 21 tablet 0   atorvastatin (LIPITOR) 40 MG tablet Take 1 tablet (40 mg total) by mouth daily. 30 tablet 0   Blood Glucose Monitoring Suppl (ONETOUCH VERIO) w/Device KIT Use as directed twice daily E11.9 1 kit 0   Cholecalciferol (VITAMIN D-3 PO) Take 1 capsule by mouth 2 (two) times  a week.     clopidogrel (PLAVIX) 75 MG tablet Take 1 tablet (75 mg total) by mouth daily. 30 tablet 0   glipiZIDE (GLUCOTROL XL) 2.5 MG 24 hr tablet TAKE 1 TABLET (2.5 MG TOTAL) BY MOUTH DAILY WITH BREAKFAST. 90 tablet 1   glucose blood (ONETOUCH VERIO) test strip USE AS DIRECTED TWICE A DAY 200 strip 12   Lancets MISC Use as directed twice daily E11.9 200 each 3   MAGNESIUM PO Take 1 tablet by mouth daily.     Multiple Vitamin (MULTIVITAMIN) tablet Take 1 tablet by mouth 3 (three) times a week.     polyethylene glycol powder (GLYCOLAX/MIRALAX) 17 GM/SCOOP powder Take 17 g by mouth 2 (two) times daily as needed. 3350 g 1   Thiamine HCl (VITAMIN B-1 PO) Take 1  tablet by mouth daily.     vitamin C (ASCORBIC ACID) 500 MG tablet Take 500-1,000 mg by mouth daily.     No current facility-administered medications on file prior to visit.        ROS:  All others reviewed and negative.  Objective        PE:  BP (!) 142/80 (BP Location: Right Arm, Patient Position: Sitting, Cuff Size: Large)   Pulse 80   Temp 98.4 F (36.9 C) (Oral)   Ht 5' 11"  (1.803 m)   Wt 183 lb (83 kg)   SpO2 100%   BMI 25.52 kg/m                 Constitutional: Pt appears in NAD               HENT: Head: NCAT.                Right Ear: External ear normal.                 Left Ear: External ear normal.                Eyes: . Pupils are equal, round, and reactive to light. Conjunctivae and EOM are normal               Nose: without d/c or deformity               Neck: Neck supple. Gross normal ROM               Cardiovascular: Normal rate and regular rhythm.                 Pulmonary/Chest: Effort normal and breath sounds without rales or wheezing.                Abd:  Soft, NT, ND, + BS, no organomegaly               Neurological: Pt is alert. At baseline orientation, motor grossly intact               Skin: Skin is warm. No rashes, no other new lesions, LE edema - none               Psychiatric: Pt behavior is normal without agitation   Micro: none  Cardiac tracings I have personally interpreted today:  none  Pertinent Radiological findings (summarize): none   Lab Results  Component Value Date   WBC 6.2 11/20/2020   HGB 14.3 11/20/2020   HCT 42.0 11/20/2020   PLT 177 11/20/2020   GLUCOSE 100 (H) 11/20/2020   CHOL 170 11/21/2020   TRIG  136 11/21/2020   HDL 51 11/21/2020   LDLDIRECT 98.0 08/02/2019   LDLCALC 92 11/21/2020   ALT 12 11/20/2020   AST 25 11/20/2020   NA 141 11/20/2020   K 4.3 11/20/2020   CL 105 11/20/2020   CREATININE 1.00 11/20/2020   BUN 21 11/20/2020   CO2 26 11/20/2020   TSH 0.320 (L) 11/21/2020   PSA 0.00 Repeated and verified X2.  (L) 07/23/2020   INR 0.9 11/20/2020   HGBA1C 6.6 (H) 11/21/2020   MICROALBUR 1.3 07/23/2020   Assessment/Plan:  Samuel Krueger is a 75 y.o. Black or African American [2] male with  has a past medical history of Hyperlipidemia, Nocturia, Prostate cancer ([place]) (urologist-- dr Janice Norrie), Recovering alcoholic in remission [facility]), S/P radiation therapy (07/15/14), Tinnitus of both ears, and Type 2 diabetes, diet controlled ([place]).  History of stroke Back to neuro baseline, pt will cont asa 81 (and plavix) qd for 21 days, then plavix 75 only after, with neuro f/u as planned  Hyperlipidemia . Lab Results  Component Value Date   LDLCALC 92 11/21/2020   Uncontroled, goal ldl < 70, pt to continue higher dose statin lipitor 40   Elevated blood pressure reading without diagnosis of hypertension Mild elevated again today  BP Readings from Last 3 Encounters:  11/25/20 (!) 142/80  11/22/20 112/75  09/30/20 135/80  but likely reactive, will continue to follow  Type 2 diabetes, diet controlled ([place]) Lab Results  Component Value Date   HGBA1C 6.6 (H) 11/21/2020   Stable, pt to continue current medical treatment  - glucotrol  Followup: Return in about 3 months (around 02/25/2021).  Cathlean Cower, MD 11/29/2020 8:41 PM [facility]

## 2020-11-26 NOTE — ED Provider Notes (Signed)
[facility]W PROGRESSIVE CARE Provider Note   CSN: [id number] Arrival date & time: 11/20/20  0945     History Chief Complaint  Patient presents with   Code Stroke    Samuel Krueger is a 75 y.o. male.  Patient with hx of DM II, lipids, prostate CA, ETOH abuse presents with slurred speech and right sided numbness since 2200. Code stroke activated. Sxs have improved since. No infectious sxs. Sxs constant.       Past Medical History:  Diagnosis Date   Hyperlipidemia    Nocturia    Prostate cancer [facility]) urologist-- dr Janice Norrie   active survellance since dx Feb 2014--  now has Stage T1c, Gleason (3+4)7, PSA 7.75   Recovering alcoholic in remission ([place])    since rehab 20 yrs ago-- approx 1995   S/P radiation therapy 07/15/14   Radioactive Seed Implant- Prostate/proximal seminal vesicles, 14,500 cGy utilizing 74 I-125 seeds implanted in 30 needles with an individual seed activity 0.658 mCi per seed for a total implant activity of 48.6.millicuries.   Tinnitus of both ears    Type 2 diabetes, diet controlled [facility])     Patient Active Problem List   Diagnosis Date Noted   Slurred speech 11/20/2020   CVA (cerebral vascular accident) ([place]) 11/20/2020   Erectile dysfunction 07/23/2020   Carcinoma of prostate ([place]) 07/23/2020   Hypertrophy of prostate without urinary obstruction and other lower urinary tract symptoms (LUTS) 07/23/2020   Irregular heart beat 07/23/2020   Dark stools 07/23/2020   Constipation 07/25/2019   Left lumbar radiculopathy 07/25/2019   Vitamin D deficiency 01/14/2019   Hyperlipidemia 03/30/2016   Recovering alcoholic in remission [facility])    Prostate cancer ([place])    Type 2 diabetes, diet controlled ([place])    S/P radiation therapy 07/15/2014   Elevated blood pressure reading without diagnosis of hypertension 11/05/2011   Alcohol use 11/02/2011    Past Surgical History:  Procedure Laterality Date   PROSTATE BIOPSY  03/26/14   RADIOACTIVE SEED IMPLANT N/A  07/15/2014   Procedure: RADIOACTIVE SEED IMPLANT/BRACHYTHERAPY IMPLANT;  Surgeon: Lowella Bandy, MD;  Location: [facility];  Service: Urology;  Laterality: N/A;       Family History  Problem Relation Age of Onset   Alcohol abuse Mother    Heart disease Mother    Hypertension Mother    Mental illness Other    HIV/AIDS Brother    Colon cancer Neg Hx     Social History   Tobacco Use   Smoking status: Former    Years: 0.80    Types: Cigarettes    Quit date: 07/07/2000    Years since quitting: 20.4   Smokeless tobacco: Never  Substance Use Topics   Alcohol use: No    Comment: Recovering alcoholic since 7209   Drug use: No    Home Medications Prior to Admission medications   Medication Sig Start Date End Date Taking? Authorizing Provider  ALPHA LIPOIC ACID PO Take 1 capsule by mouth daily.   Yes [provider]  Cholecalciferol (VITAMIN D-3 PO) Take 1 capsule by mouth 2 (two) times a week.   Yes [provider]  glipiZIDE (GLUCOTROL XL) 2.5 MG 24 hr tablet TAKE 1 TABLET (2.5 MG TOTAL) BY MOUTH DAILY WITH BREAKFAST. 08/14/20  Yes Biagio Borg, MD  MAGNESIUM PO Take 1 tablet by mouth daily.   Yes [provider]  Multiple Vitamin (MULTIVITAMIN) tablet Take 1 tablet by mouth 3 (three) times a  week.   Yes [provider]  Thiamine HCl (VITAMIN B-1 PO) Take 1 tablet by mouth daily.   Yes [provider]  vitamin C (ASCORBIC ACID) 500 MG tablet Take 500-1,000 mg by mouth daily.   Yes [provider]  aspirin EC 81 MG EC tablet Take 1 tablet (81 mg total) by mouth daily for 21 days. Swallow whole. 11/23/20 12/14/20  Alma Friendly, MD  atorvastatin (LIPITOR) 40 MG tablet Take 1 tablet (40 mg total) by mouth daily. 11/23/20 12/23/20  Alma Friendly, MD  Blood Glucose Monitoring Suppl [facility] VERIO) w/Device KIT Use as directed twice daily E11.9 08/06/19   Biagio Borg, MD  clopidogrel (PLAVIX) 75 MG tablet Take 1  tablet (75 mg total) by mouth daily. 11/23/20 12/23/20  Alma Friendly, MD  glucose blood [facility] VERIO) test strip USE AS DIRECTED TWICE A DAY 08/13/20   Biagio Borg, MD  Lancets MISC Use as directed twice daily E11.9 08/06/19   Biagio Borg, MD  polyethylene glycol powder (GLYCOLAX/MIRALAX) 17 GM/SCOOP powder Take 17 g by mouth 2 (two) times daily as needed. 07/12/18   Biagio Borg, MD    Allergies    Jardiance [empagliflozin], Metformin and related, and Shrimp [shellfish allergy]  Review of Systems   Review of Systems  Unable to perform ROS: Acuity of condition   Physical Exam Updated Vital Signs BP 112/75 (BP Location: Right Arm)   Pulse (!) 55   Temp 98.3 F (36.8 C) (Oral)   Resp 18   Ht _0  (1.803 m)   Wt 77.1 kg   SpO2 100%   BMI 23.71 kg/m   Physical Exam Vitals and nursing note reviewed.  Constitutional:      General: He is not in acute distress.    Appearance: He is well-developed.  HENT:     Head: Normocephalic and atraumatic.     Mouth/Throat:     Mouth: Mucous membranes are moist.  Eyes:     General:        Right eye: No discharge.        Left eye: No discharge.     Conjunctiva/sclera: Conjunctivae normal.  Neck:     Trachea: No tracheal deviation.  Cardiovascular:     Rate and Rhythm: Normal rate and regular rhythm.     Heart sounds: No murmur heard. Pulmonary:     Effort: Pulmonary effort is normal.     Breath sounds: Normal breath sounds.  Abdominal:     General: There is no distension.     Palpations: Abdomen is soft.     Tenderness: There is no abdominal tenderness. There is no guarding.  Musculoskeletal:        General: Normal range of motion.     Cervical back: Normal range of motion and neck supple. No rigidity.  Skin:    General: Skin is warm.     Capillary Refill: Capillary refill takes less than 2 seconds.     Findings: No rash.  Neurological:     General: No focal deficit present.     Mental Status: He is alert.      Cranial Nerves: No cranial nerve deficit.     Sensory: No sensory deficit.     Motor: No weakness.     Coordination: Coordination normal.  Psychiatric:        Mood and Affect: Mood normal.    ED Results / Procedures / Treatments   Labs (all labs  ordered are listed, but only abnormal results are displayed) Labs Reviewed  COMPREHENSIVE METABOLIC PANEL - Abnormal; Notable for the following components:      Result Value   Glucose, Bld 104 (*)    All other components within normal limits  HEMOGLOBIN A1C - Abnormal; Notable for the following components:   Hgb A1c MFr Bld 6.6 (*)    All other components within normal limits  TSH - Abnormal; Notable for the following components:   TSH 0.320 (*)    All other components within normal limits  GLUCOSE, CAPILLARY - Abnormal; Notable for the following components:   Glucose-Capillary 116 (*)    All other components within normal limits  GLUCOSE, CAPILLARY - Abnormal; Notable for the following components:   Glucose-Capillary 189 (*)    All other components within normal limits  GLUCOSE, CAPILLARY - Abnormal; Notable for the following components:   Glucose-Capillary 138 (*)    All other components within normal limits  GLUCOSE, CAPILLARY - Abnormal; Notable for the following components:   Glucose-Capillary 118 (*)    All other components within normal limits  GLUCOSE, CAPILLARY - Abnormal; Notable for the following components:   Glucose-Capillary 133 (*)    All other components within normal limits  I-STAT CHEM 8, ED - Abnormal; Notable for the following components:   Glucose, Bld 100 (*)    All other components within normal limits  CBG MONITORING, ED - Abnormal; Notable for the following components:   Glucose-Capillary 138 (*)    All other components within normal limits  CBG MONITORING, ED - Abnormal; Notable for the following components:   Glucose-Capillary 121 (*)    All other components within normal limits  RESP PANEL BY RT-PCR  (FLU A&B, COVID) ARPGX2  PROTIME-INR  APTT  CBC  DIFFERENTIAL  LIPID PANEL  T4, FREE  T3  GLUCOSE, CAPILLARY  CBG MONITORING, ED  CBG MONITORING, ED    EKG EKG Interpretation  Date/Time:  Friday November 20 2020 10:17:27 EDT Ventricular Rate:  68 PR Interval:  203 QRS Duration: 118 QT Interval:  401 QTC Calculation: 427 R Axis:   -41 Text Interpretation: Sinus rhythm Probable left atrial enlargement Nonspecific IVCD with LAD Borderline low voltage, extremity leads Minimal ST elevation, anterior leads Confirmed by Elnora Morrison [phone]) on 11/20/2020 10:50:18 AM  Radiology No results found.  Procedures Procedures   Medications Ordered in ED Medications  sodium chloride flush (NS) 0.9 % injection 3 mL (3 mLs Intravenous Given 11/20/20 1030)  aspirin tablet 325 mg (325 mg Oral Given 11/20/20 1029)  iohexol (OMNIPAQUE) 350 MG/ML injection 75 mL (75 mLs Intravenous Contrast Given 11/20/20 1002)   stroke: mapping our early stages of recovery book ( Does not apply Given 11/20/20 1524)    ED Course  I have reviewed the triage vital signs and the nursing notes.  Pertinent labs & imaging results that were available during my care of the patient were reviewed by me and considered in my medical decision making (see chart for details).    MDM Rules/Calculators/A&P                           Patient presents as code stroke. Concern for occult stroke or TIA, signs have improved, no significant neuro deficit on exam. Patient monitored, protecting airways, glucose normal reviewed. Blood work showed normal electrolytes. EKG reviewed sinus.  CT head no acute bleeding. Discussed with neurology, recommends hospitalist admit.    Final  Clinical Impression(s) / ED Diagnoses Final diagnoses:  TIA (transient ischemic attack)    Rx / DC Orders ED Discharge Orders          Ordered    aspirin EC 81 MG EC tablet  Daily        11/22/20 1107    atorvastatin (LIPITOR) 40 MG tablet   Daily        11/22/20 1107    clopidogrel (PLAVIX) 75 MG tablet  Daily        11/22/20 1107    Increase activity slowly        11/22/20 1308    Diet - low sodium heart healthy        11/22/20 1308    Ambulatory referral to Occupational Therapy        11/22/20 1341    Ambulatory referral to Neurology       Comments: Follow up with stroke clinic NP (Jessica Slater or Cecille Rubin, if both not available, consider Zachery Dauer, or [place]) at [facility] in about 4 weeks. Thanks.   11/21/20 1951             Elnora Morrison, MD 11/26/20 [phone]

## 2020-11-29 ENCOUNTER — Encounter: Payer: Self-pay | Admitting: Internal Medicine

## 2020-11-29 NOTE — Assessment & Plan Note (Signed)
Lab Results  Component Value Date   HGBA1C 6.6 (H) 11/21/2020   Stable, pt to continue current medical treatment  - glucotrol

## 2020-11-29 NOTE — Assessment & Plan Note (Signed)
Mild elevated again today  BP Readings from Last 3 Encounters:  11/25/20 (!) 142/80  11/22/20 112/75  09/30/20 135/80  but likely reactive, will continue to follow

## 2020-11-29 NOTE — Assessment & Plan Note (Signed)
Back to neuro baseline, pt will cont asa 81 (and plavix) qd for 21 days, then plavix 75 only after, with neuro f/u as planned

## 2020-11-29 NOTE — Assessment & Plan Note (Signed)
.   Lab Results  Component Value Date   LDLCALC 92 11/21/2020   Uncontroled, goal ldl < 70, pt to continue higher dose statin lipitor 40

## 2020-12-07 NOTE — Progress Notes (Deleted)
12/07/2020 Samuel Krueger [id number] March 28, 1945   CHIEF COMPLAINT:   HISTORY OF PRESENT ILLNESS: Samuel Krueger. [name] is a 75 year old male with a past medical history alcohol use disorder (abstinent 1995), hypertension, hyperlipidemia, CVA  11/20/2020, DM II and prostate cancer s/p radiation 2016. He presents to our office today as referred by Dr. Cathlean Cower to schedule a screening colonoscopy. Dark stools.   He was admitted to the hospital 11/20/2020 with new onset slurred speech and right leg numbness and generalized weakness.  Brain MRI confirmed acute infarct in the left internal capsule. He was placed ASA and Plavix x 21 days then only Plavix 75 mg QD thereafter.  He was discharged home on 11/22/2020 with plans to follow-up with neurology in 4 weeks.  He was seen by cardiologist Dr. Alvester Chou 09/30/2020  due to having palpitations.  Echo 08/2020 with LV EF 55 - 60 %.  He underwent a Zio patch monitor which showed runs of SVT.   CBC Latest Ref Rng & Units 11/20/2020 11/20/2020 07/23/2020  WBC 4.0 - 10.5 K/uL - 6.2 4.1  Hemoglobin 13.0 - 17.0 g/dL 14.3 13.6 13.8  Hematocrit 39.0 - 52.0 % 42.0 43.6 42.0  Platelets 150 - 400 K/uL - 177 161.0    CMP Latest Ref Rng & Units 11/20/2020 11/20/2020 07/23/2020  Glucose 70 - 99 mg/dL 100(H) 104(H) 119(H)  BUN 8 - 23 mg/dL _0 Creatinine 0.61 - 1.24 mg/dL 1.00 1.09 0.98  Sodium 135 - 145 mmol/L 141 139 140  Potassium 3.5 - 5.1 mmol/L 4.3 4.4 4.3  Chloride 98 - 111 mmol/L 105 106 102  CO2 22 - 32 mmol/L - 26 33(H)  Calcium 8.9 - 10.3 mg/dL - 9.3 9.2  Total Protein 6.5 - 8.1 g/dL - 7.0 7.2  Total Bilirubin 0.3 - 1.2 mg/dL - 0.7 0.7  Alkaline Phos 38 - 126 U/L - 43 42  AST 15 - 41 U/L - 25 24  ALT 0 - 44 U/L - 12 10    Zio patch monitor 09/30/2020:  Preliminary Findings Final Interpretation Patient had a min HR of 43 bpm, max HR of 176 bpm, and avg HR of 80 bpm. Predominant underlying rhythm was Sinus Rhythm. 23 Supraventricular  Tachycardia runs occurred, the run with the fastest interval lasting 7 beats with a max rate of 176 bpm, the longest lasting 19.0 secs with an avg rate of 100 bpm. Some episodes of Supraventricular Tachycardia may be possible Atrial Tachycardia with variable block. Isolated SVEs were rare (<1.0%), SVE Couplets were rare (<1.0%), and SVE Triplets were rare (<1.0%). Isolated VEs were rare (<1.0%), VE Couplets were rare (<1.0%), and no VE Triplets were present. Ventricular Trigeminy was present.  Echo 08/21/2020: 1. Left ventricular ejection fraction, by estimation, is 55 to 60%. The left ventricle has normal function. The left ventricle has no regional wall motion abnormalities. Left ventricular diastolic parameters are indeterminate. 2. Right ventricular systolic function is normal. The right ventricular size is normal. 3. The mitral valve is grossly normal. Trivial mitral valve regurgitation. 4. The aortic valve is normal in structure. Aortic valve regurgitation is not visualized. No aortic stenosis is present. 5. Aortic dilatation noted. There is borderline dilatation of the ascending aorta, measuring 38 mm  Colonoscopy for + FOBT 12/30/2002 by Dr. Fuller Plan: Internal hemorrhoids No polyps Rectal biopsies showed rectal mucosa prolapse    Past Medical History:  Diagnosis Date   Hyperlipidemia    Nocturia    Prostate cancer (  [facility]) urologist-- dr Janice Norrie   active survellance since dx Feb 2014--  now has Stage T1c, Gleason (3+4)7, PSA 7.75   Recovering alcoholic in remission ([place])    since rehab 20 yrs ago-- approx 1995   S/P radiation therapy 07/15/14   Radioactive Seed Implant- Prostate/proximal seminal vesicles, 14,500 cGy utilizing 74 I-125 seeds implanted in 30 needles with an individual seed activity 0.658 mCi per seed for a total implant activity of 48.6.millicuries.   Tinnitus of both ears    Type 2 diabetes, diet controlled ([address])    Past Surgical History:  Procedure Laterality Date    PROSTATE BIOPSY  03/26/14   RADIOACTIVE SEED IMPLANT N/A 07/15/2014   Procedure: RADIOACTIVE SEED IMPLANT/BRACHYTHERAPY IMPLANT;  Surgeon: Lowella Bandy, MD;  [facility]: [facility];  Service: Urology;  Laterality: N/A;   Social History:  Family History:    reports that he quit smoking about 20 years ago. His smoking use included cigarettes. He has never used smokeless tobacco. He reports that he does not drink alcohol and does not use drugs. family history includes Alcohol abuse in his mother; HIV/AIDS in his brother; Heart disease in his mother; Hypertension in his mother; Mental illness in an other family member. Allergies  Allergen Reactions   Jardiance [Empagliflozin] Diarrhea   Metformin And Related Other (See Comments)    Chest tightness   Shrimp [Shellfish Allergy] Nausea Only and Other (See Comments)    "strange GI reaction"/stomach turns over- no breathing issues, however      Outpatient Encounter Medications as of 12/08/2020  Medication Sig   ALPHA LIPOIC ACID PO Take 1 capsule by mouth daily.   aspirin EC 81 MG EC tablet Take 1 tablet (81 mg total) by mouth daily for 21 days. Swallow whole.   atorvastatin (LIPITOR) 40 MG tablet Take 1 tablet (40 mg total) by mouth daily.   Blood Glucose Monitoring Suppl (ONETOUCH VERIO) w/Device KIT Use as directed twice daily E11.9   Cholecalciferol (VITAMIN D-3 PO) Take 1 capsule by mouth 2 (two) times a week.   clopidogrel (PLAVIX) 75 MG tablet Take 1 tablet (75 mg total) by mouth daily.   glipiZIDE (GLUCOTROL XL) 2.5 MG 24 hr tablet TAKE 1 TABLET (2.5 MG TOTAL) BY MOUTH DAILY WITH BREAKFAST.   glucose blood (ONETOUCH VERIO) test strip USE AS DIRECTED TWICE A DAY   Lancets MISC Use as directed twice daily E11.9   MAGNESIUM PO Take 1 tablet by mouth daily.   Multiple Vitamin (MULTIVITAMIN) tablet Take 1 tablet by mouth 3 (three) times a week.   polyethylene glycol powder (GLYCOLAX/MIRALAX) 17 GM/SCOOP powder Take 17 g by mouth  2 (two) times daily as needed.   Thiamine HCl (VITAMIN B-1 PO) Take 1 tablet by mouth daily.   vitamin C (ASCORBIC ACID) 500 MG tablet Take 500-1,000 mg by mouth daily.   No facility-administered encounter medications on file as of 12/08/2020.     REVIEW OF SYSTEMS: All other systems reviewed and negative except where noted in the History of Present Illness.  Gen: Denies fever, sweats or chills. No weight loss.  CV: Denies chest pain, palpitations or edema. Resp: Denies cough, shortness of breath of hemoptysis.  GI: Denies heartburn, dysphagia, stomach or lower abdominal pain. No diarrhea or constipation.  GU : Denies urinary burning, blood in urine, increased urinary frequency or incontinence. MS: Denies joint pain, muscles aches or weakness. Derm: Denies rash, itchiness, skin lesions or unhealing ulcers. Psych: Denies depression, anxiety, memory loss, suicidal  ideation and confusion. Heme: Denies bruising, bleeding. Neuro:  Denies headaches, dizziness or paresthesias. Endo:  Denies any problems with DM, thyroid or adrenal function.   PHYSICAL EXAM: There were no vitals taken for this visit. General: Well developed ... in no acute distress. Head: Normocephalic and atraumatic. Eyes:  Sclerae non-icteric, conjunctive pink. Ears: Normal auditory acuity. Mouth: Dentition intact. No ulcers or lesions.  Neck: Supple, no lymphadenopathy or thyromegaly.  Lungs: Clear bilaterally to auscultation without wheezes, crackles or rhonchi. Heart: Regular rate and rhythm. No murmur, rub or gallop appreciated.  Abdomen: Soft, nontender, non distended. No masses. No hepatosplenomegaly. Normoactive bowel sounds x 4 quadrants.  Rectal:  Musculoskeletal: Symmetrical with no gross deformities. Skin: Warm and dry. No rash or lesions on visible extremities. Extremities: No edema. Neurological: Alert oriented x 4, no focal deficits.  Psychological:  Alert and cooperative. Normal mood and  affect.  ASSESSMENT AND PLAN:  35) 75 year old male s/p acute CVA on ASA and Plavix     CC:  Biagio Borg, MD

## 2020-12-08 ENCOUNTER — Ambulatory Visit: Payer: HMO | Admitting: Nurse Practitioner

## 2020-12-16 ENCOUNTER — Telehealth: Payer: Self-pay | Admitting: *Deleted

## 2020-12-16 NOTE — Chronic Care Management (AMB) (Signed)
  Chronic Care Management   Note  12/16/2020 Name: Samuel Krueger MRN: [id number] DOB: Jun 30, 1945  Samuel Krueger is a 75 y.o. year old male who is a primary care patient of Biagio Borg, MD. I reached out to Tommi Emery by phone today in response to a referral sent by [name] PCP.  [name] was given information about Chronic Care Management services today including:  CCM service includes personalized support from designated clinical staff supervised by his physician, including individualized plan of care and coordination with other care providers 24/7 contact phone numbers for assistance for urgent and routine care needs. Service will only be billed when office clinical staff spend 20 minutes or more in a month to coordinate care. Only one practitioner may furnish and bill the service in a calendar month. The patient may stop CCM services at any time (effective at the end of the month) by phone call to the office staff. The patient is responsible for co-pay (up to 20% after annual deductible is met) if co-pay is required by the individual health plan.   Patient agreed to services and verbal consent obtained.   Follow up plan: Telephone appointment with care management team member scheduled for:12/22/20  [facility]  Direct Dial: [phone]

## 2020-12-16 NOTE — Chronic Care Management (AMB) (Signed)
  Chronic Care Management   Outreach Note  12/16/2020 Name: [name] MRN: [id number] DOB: 1945-10-22  Samuel Krueger is a 75 y.o. year old male who is a primary care patient of Biagio Borg, MD. I reached out to Tommi Emery by phone today in response to a referral sent by Mr. YOUCEF KLAS primary care provider.  An unsuccessful telephone outreach was attempted today. The patient was referred to the case management team for assistance with care management and care coordination.   Follow Up Plan: A HIPAA compliant phone message was left for the patient providing contact information and requesting a return call. The care management team will reach out to the patient again over the next 7 days. If patient returns call to provider office, please advise to call [place] at [phone].   [facility]  Direct Dial: [phone]

## 2020-12-22 ENCOUNTER — Ambulatory Visit (INDEPENDENT_AMBULATORY_CARE_PROVIDER_SITE_OTHER): Payer: HMO | Admitting: *Deleted

## 2020-12-22 ENCOUNTER — Other Ambulatory Visit: Payer: Self-pay | Admitting: Internal Medicine

## 2020-12-22 DIAGNOSIS — Z8673 Personal history of transient ischemic attack (TIA), and cerebral infarction without residual deficits: Secondary | ICD-10-CM

## 2020-12-22 DIAGNOSIS — E119 Type 2 diabetes mellitus without complications: Secondary | ICD-10-CM

## 2020-12-22 DIAGNOSIS — E7800 Pure hypercholesterolemia, unspecified: Secondary | ICD-10-CM

## 2020-12-22 MED ORDER — CLOPIDOGREL BISULFATE 75 MG PO TABS
75.0000 mg | ORAL_TABLET | Freq: Every day | ORAL | 3 refills | Status: DC
Start: 2020-12-22 — End: 2021-12-23

## 2020-12-22 MED ORDER — ATORVASTATIN CALCIUM 40 MG PO TABS: 40.0000 mg | ORAL_TABLET | Freq: Every day | ORAL | 3 refills | Status: DC

## 2020-12-22 NOTE — Patient Instructions (Addendum)
Visit Information   Samuel Krueger, it was nice talking with you today.    I look forward to talking to you again for a telephone update on Friday, January 22, 2021 at 11:30 am- please be listening out for my call that day.  I Krueger call as close to 11:30 am as possible.   If you need to cancel or re-schedule our telephone visit, please call [phone] and one of our care guides Krueger be happy to assist you.   I look forward to hearing about your progress.   Please don't hesitate to contact me if I can be of assistance to you before our next scheduled telephone appointment.   Samuel Rack, RN, BSN, [facility] RN Care Coordination- Samuel Krueger [phone]: direct office [phone]: mobile   Patient Self-Care Activities: Patient Samuel Krueger: Self administer medications as prescribed Attend all scheduled provider appointments Call pharmacy for medication refills Call provider office for new concerns or questions Continue to check fasting (first thing in the morning, before eating) blood sugars at home Continue to follow heart healthy, low salt, low cholesterol, carbohydrate-modified, low sugar diet Review enclosed educational material around cholesterol content in foods  Listen for a call from the [place] [facility] to discuss possible resources for dental provider services that may be available Call the [place] about any possible benefits you may have Schedule an appointment to have your eyes examined  Cholesterol Content in Foods Cholesterol is a waxy, fat-like substance that helps to carry fat in the blood. The body needs cholesterol in small amounts, but too much cholesterol can cause damage to the arteries and heart. What foods have cholesterol? Cholesterol is found in animal-based foods, such as meat, seafood, and dairy. Generally, low-fat dairy and lean meats have less cholesterol than full-fat dairy and fatty meats. The milligrams of cholesterol  per serving (mg per serving) of common cholesterol-containing foods are listed below. Meats and other proteins Egg -- one large whole egg has 186 mg. Veal shank -- 4 oz (113 g) has 141 mg. Lean ground [place] (93% lean) -- 4 oz (113 g) has 118 mg. Fat-trimmed lamb loin -- 4 oz (113 g) has 106 mg. Lean ground beef (90% lean) -- 4 oz (113 g) has 100 mg. Lobster -- 3.5 oz (99 g) has 90 mg. Pork loin chops -- 4 oz (113 g) has 86 mg. Canned salmon -- 3.5 oz (99 g) has 83 mg. Fat-trimmed beef top loin -- 4 oz (113 g) has 78 mg. Frankfurter -- 1 frank (3.5 oz or 99 g) has 77 mg. Crab -- 3.5 oz (99 g) has 71 mg. Roasted chicken without skin, white meat -- 4 oz (113 g) has 66 mg. Light bologna -- 2 oz (57 g) has 45 mg. Deli-cut [place] -- 2 oz (57 g) has 31 mg. Canned tuna -- 3.5 oz (99 g) has 31 mg. [name] -- 1 oz (28 g) has 29 mg. Oysters and mussels (raw) -- 3.5 oz (99 g) has 25 mg. Mackerel -- 1 oz (28 g) has 22 mg. Trout -- 1 oz (28 g) has 20 mg. Pork sausage -- 1 link (1 oz or 28 g) has 17 mg. Salmon -- 1 oz (28 g) has 16 mg. Tilapia -- 1 oz (28 g) has 14 mg. Dairy Soft-serve ice cream --  cup (4 oz or 86 g) has 103 mg. Whole-milk yogurt -- 1 cup (8 oz or 245 g) has 29 mg. Cheddar cheese --  1 oz (28 g) has 28 mg. American cheese -- 1 oz (28 g) has 28 mg. Whole milk -- 1 cup (8 oz or 250 mL) has 23 mg. 2% milk -- 1 cup (8 oz or 250 mL) has 18 mg. Cream cheese -- 1 tablespoon (Tbsp) (14.5 g) has 15 mg. Cottage cheese --  cup (4 oz or 113 g) has 14 mg. Low-fat (1%) milk -- 1 cup (8 oz or 250 mL) has 10 mg. Sour cream -- 1 Tbsp (12 g) has 8.5 mg. Low-fat yogurt -- 1 cup (8 oz or 245 g) has 8 mg. Nonfat Greek yogurt -- 1 cup (8 oz or 228 g) has 7 mg. Half-and-half cream -- 1 Tbsp (15 mL) has 5 mg. Fats and oils Cod liver oil -- 1 tablespoon (Tbsp) (13.6 g) has 82 mg. Butter -- 1 Tbsp (14 g) has 15 mg. Lard -- 1 Tbsp (12.8 g) has 14 mg. Bacon grease -- 1 Tbsp (12.9 g) has 14  mg. Mayonnaise -- 1 Tbsp (13.8 g) has 5-10 mg. Margarine -- 1 Tbsp (14 g) has 3-10 mg. The items listed above may not be a complete list of foods with cholesterol. Exact amounts of cholesterol in these foods may vary depending on specific ingredients and brands. Contact a dietitian for more information. What foods do not have cholesterol? Most plant-based foods do not have cholesterol unless you combine them with a food that has cholesterol. Foods without cholesterol include: Grains and cereals. Vegetables. Fruits. Vegetable oils, such as olive, canola, and sunflower oil. Legumes, such as peas, beans, and lentils. Nuts and seeds. Egg whites. The items listed above may not be a complete list of foods that do not have cholesterol. Contact a dietitian for more information. Summary The body needs cholesterol in small amounts, but too much cholesterol can cause damage to the arteries and heart. Cholesterol is found in animal-based foods, such as meat, seafood, and dairy. Generally, low-fat dairy and lean meats have less cholesterol than full-fat dairy and fatty meats. This information is not intended to replace advice given to you by your health care provider. Make sure you discuss any questions you have with your health care provider. Document Revised: 06/05/2020 Document Reviewed: 06/05/2020 Samuel Krueger Patient Education  2022 [place].   PATIENT GOALS/PLAN OF CARE:  Care Plan : RN Care Manager Plan of Care  Updates made by Samuel Royalty, RN since 12/22/2020 12:00 AM     Problem: Chronic Disease Management Needs   Priority: High     Long-Range Goal: Development of plan of care for long term chronic disease management   Start Date: 12/22/2020  Expected End Date: 12/22/2021  Priority: High  Note:   Current Barriers:  Chronic Disease Management support and education needs related to HLD and DMII Recent hospitalization October 14-16, 2022 for TIA/ CVA- no deficits/ residual  identified  RNCM Clinical Goal(s):  Patient Krueger demonstrate Ongoing health management independence DMII; HLD  through collaboration with RN Care manager, provider, and care team.   Interventions: 1:1 collaboration with primary care provider regarding development and update of comprehensive plan of care as evidenced by provider attestation and co-signature Inter-disciplinary care team collaboration (see longitudinal plan of care) Evaluation of current treatment plan related to  self management and patient's adherence to plan as established by provider  Hyperlipidemia Interventions: Provider established cholesterol goals reviewed; Counseled on importance of regular laboratory monitoring as prescribed; Provided HLD educational materials; Reviewed role and benefits of statin for ASCVD  risk reduction; Reviewed importance of limiting foods high in cholesterol; Reviewed exercise goals and target of 150 minutes per week; Assessed social determinant of health barriers;  Discussed recent hospitalization with patient- reports back to baseline, has resumed normal; activities, including driving self Confirmed no medication concerns-- independently self- manages, does not use pill box, has personal organization system; declines full medication review; verbalizes very good understanding of purpose of meds and denies questions Discussed need for prescription refills for Atorvastatin- only has 2 days left; needs refill for clopidogrel-- took last dose today- called his outpatient pharmacy several days ago, needs medications refilled right away to avoid missing doses-- Krueger make PCP aware Confirmed obtained flu vaccine for 2022-23 flu/ winter season at PCP office visit 11/25/20; discussed/ provided education around infection prevention measures Pain Assessment completed; patient denies acute/ chronic pain  Diabetes Interventions: Assessed patient's understanding of A1c goal: <6.5% Provided education to  patient about basic DM disease process; Counseled on importance of regular laboratory monitoring as prescribed; Discussed plans with patient for ongoing care management follow up and provided patient with direct contact information for care management team; Reviewed scheduled/upcoming provider appointments including: 12/25/20- GI provider; 12/29/20- neurologist; Referral made to community resources care guide team for assistance with dental resources- needs several extractions, insurance plan does not cover; Review of patient status, including review of consultants reports, relevant laboratory and other test results, and medications completed; Reviewed recent blood sugars at home: reports consistent fasting blood sugars between 85-134; denies recent low blood sugars- can tell when blood sugars start to fall- eats several small frequent meals throughout day, which he has found helpful in preventing hypoglycemia at home Discussed/ provided education around signs/ symptoms low blood sugar and corresponding action plan- patient verbalizes good understanding of same Assessed patient's understanding of significance of A1-C values: he has a very good baseline understanding of same- reviewed with patient individual historical trends over time Encouraged patient to schedule prompt eye exam- he has not had annual eye exam in several years and has cataracts that he believes need to be surgically removed Discussed with / provided education around self- dietary strategies for management of blood sugars: patient has very good understanding of same at baseline- positive reinforcement provided with encouragement to continue efforts Discussed/ provided education around benefits of exercise: he is hopeful to re-join gym "soon"  Lab Results  Component Value Date   HGBA1C 6.6 (H) 11/21/2020  Patient Goals/Self-Care Activities: As evidenced by review of EHR, collaboration with care team, and patient reporting during CCM  RN CM outreach, Patient Samuel Krueger Krueger: Self administer medications as prescribed Attend all scheduled provider appointments Call pharmacy for medication refills Call provider office for new concerns or questions Continue to check fasting (first thing in the morning, before eating) blood sugars at home Continue to follow heart healthy, low salt, low cholesterol, carbohydrate-modified, low sugar diet Review enclosed educational material around cholesterol content in foods  Listen for a call from the [place] to discuss possible resources for dental provider services that may be available Call the [facility] about any possible benefits you may have Schedule an appointment to have your eyes examined  Follow Up Plan:   Telephone follow up appointment with care management team member scheduled for: Friday, January 22, 2021 at 11:30 am The patient has been provided with contact information for the care management team and has been advised to call with any health related questions or concerns    Consent to CCM Services: Samuel Krueger was given information about Chronic Care Management services 12/16/20 including:  CCM service includes personalized support from designated clinical staff supervised by his physician, including individualized plan of care and coordination with other care providers 24/7 contact phone numbers for assistance for urgent and routine care needs. Service Krueger only be billed when office clinical staff spend 20 minutes or more in a month to coordinate care. Only one practitioner may furnish and bill the service in a calendar month. The patient may stop CCM services at any time (effective at the end of the month) by phone call to the office staff. The patient Krueger be responsible for cost sharing (co-pay) of up to 20% of the service fee (after annual deductible is met).  Patient agreed to services and verbal consent obtained.   Patient verbalizes understanding of  instructions provided today and agrees to view in MyChart  Telephone follow up appointment with care management team member scheduled for: Friday, January 22, 2021 at 11:30 am The patient has been provided with contact information for the care management team and has been advised to call with any health related questions or concerns  Samuel Rack, RN, BSN, [place] [phone]: direct office [phone]: mobile

## 2020-12-22 NOTE — Chronic Care Management (AMB) (Signed)
Chronic Care Management   CCM RN Visit Note  12/22/2020 Name: Samuel Krueger MRN: [id number] DOB: [birth year]  Subjective: Samuel Krueger is a 75 y.o. year old male who is a primary care patient of Biagio Borg, MD. The care management team was consulted for assistance with disease management and care coordination needs.    Engaged with patient by telephone for initial visit in response to provider referral for case management and/or care coordination services.   Consent to Services:  The patient was given information about Chronic Care Management services, agreed to services, and gave verbal consent 12/16/20 prior to initiation of services.  Please see initial visit note for detailed documentation.  Patient agreed to services and verbal consent obtained.   Assessment: Review of patient past medical history, allergies, medications, health status, including review of consultants reports, laboratory and other test data, was performed as part of comprehensive evaluation and provision of chronic care management services.   SDOH (Social Determinants of Health) assessments and interventions performed:  SDOH Interventions    Flowsheet Row Most Recent Value  SDOH Interventions   Food Insecurity Interventions Intervention Not Indicated  Housing Interventions Intervention Not Indicated  [lives in single family home x 13 years,  rental,  lives with 2 roommates]  Transportation Interventions Intervention Not Indicated  [Patient drives self]      CCM Care Plan  Allergies  Allergen Reactions   Jardiance [Empagliflozin] Diarrhea   Metformin And Related Other (See Comments)    Chest tightness   Shrimp [Shellfish Allergy] Nausea Only and Other (See Comments)    "strange GI reaction"/stomach turns over- no breathing issues, however   Outpatient Encounter Medications as of 12/22/2020  Medication Sig   ALPHA LIPOIC ACID PO Take 1 capsule by mouth daily.   atorvastatin (LIPITOR) 40 MG tablet  Take 1 tablet (40 mg total) by mouth daily.   Blood Glucose Monitoring Suppl (ONETOUCH VERIO) w/Device KIT Use as directed twice daily E11.9   Cholecalciferol (VITAMIN D-3 PO) Take 1 capsule by mouth 2 (two) times a week.   clopidogrel (PLAVIX) 75 MG tablet Take 1 tablet (75 mg total) by mouth daily.   glipiZIDE (GLUCOTROL XL) 2.5 MG 24 hr tablet TAKE 1 TABLET (2.5 MG TOTAL) BY MOUTH DAILY WITH BREAKFAST.   glucose blood (ONETOUCH VERIO) test strip USE AS DIRECTED TWICE A DAY   Lancets MISC Use as directed twice daily E11.9   MAGNESIUM PO Take 1 tablet by mouth daily.   Multiple Vitamin (MULTIVITAMIN) tablet Take 1 tablet by mouth 3 (three) times a week.   polyethylene glycol powder (GLYCOLAX/MIRALAX) 17 GM/SCOOP powder Take 17 g by mouth 2 (two) times daily as needed.   Thiamine HCl (VITAMIN B-1 PO) Take 1 tablet by mouth daily.   vitamin C (ASCORBIC ACID) 500 MG tablet Take 500-1,000 mg by mouth daily.   No facility-administered encounter medications on file as of 12/22/2020.   Patient Active Problem List   Diagnosis Date Noted   Slurred speech 11/20/2020   History of stroke 11/20/2020   Erectile dysfunction 07/23/2020   Carcinoma of prostate ([place]) 07/23/2020   Hypertrophy of prostate without urinary obstruction and other lower urinary tract symptoms (LUTS) 07/23/2020   Irregular heart beat 07/23/2020   Dark stools 07/23/2020   Constipation 07/25/2019   Left lumbar radiculopathy 07/25/2019   Vitamin D deficiency 01/14/2019   Hyperlipidemia 03/30/2016   Recovering alcoholic in remission [facility])    Prostate cancer ([place])    Type 2  diabetes, diet controlled ([place])    S/P radiation therapy 07/15/2014   Elevated blood pressure reading without diagnosis of hypertension 11/05/2011   Alcohol use 11/02/2011   Conditions to be addressed/monitored:  HLD, DMII, and recent CVA  Care Plan : RN Care Manager Plan of Care  Updates made by [name], RN since 12/22/2020 12:00 AM      Problem: Chronic Disease Management Needs   Priority: High     Long-Range Goal: Development of plan of care for long term chronic disease management   Start Date: 12/22/2020  Expected End Date: 12/22/2021  Priority: High  Note:   Current Barriers:  Chronic Disease Management support and education needs related to HLD and DMII Recent hospitalization October 14-16, 2022 for TIA/ CVA- no deficits/ residual identified  RNCM Clinical Goal(s):  Patient will demonstrate Ongoing health management independence DMII; HLD  through collaboration with RN Care manager, provider, and care team.   Interventions: 1:1 collaboration with primary care provider regarding development and update of comprehensive plan of care as evidenced by provider attestation and co-signature Inter-disciplinary care team collaboration (see longitudinal plan of care) Evaluation of current treatment plan related to  self management and patient's adherence to plan as established by provider  Hyperlipidemia Interventions: Provider established cholesterol goals reviewed; Counseled on importance of regular laboratory monitoring as prescribed; Provided HLD educational materials; Reviewed role and benefits of statin for ASCVD risk reduction; Reviewed importance of limiting foods high in cholesterol; Reviewed exercise goals and target of 150 minutes per week; Assessed social determinant of health barriers;  Discussed recent hospitalization with patient- reports back to baseline, has resumed normal; activities, including driving self Confirmed no medication concerns-- independently self- manages, does not use pill box, has personal organization system; declines full medication review; verbalizes very good understanding of purpose of meds and denies questions Discussed need for prescription refills for Atorvastatin- only has 2 days left; needs refill for clopidogrel-- took last dose today- called his outpatient pharmacy several  days ago, needs medications refilled right away to avoid missing doses-- will make PCP aware Confirmed obtained flu vaccine for 2022-23 flu/ winter season at PCP office visit 11/25/20; discussed/ provided education around infection prevention measures Pain Assessment completed; patient denies acute/ chronic pain  Diabetes Interventions: Assessed patient's understanding of A1c goal: <6.5% Provided education to patient about basic DM disease process; Counseled on importance of regular laboratory monitoring as prescribed; Discussed plans with patient for ongoing care management follow up and provided patient with direct contact information for care management team; Reviewed scheduled/upcoming provider appointments including: 12/25/20- GI provider; 12/29/20- neurologist; Referral made to community resources care guide team for assistance with dental resources- needs several extractions, insurance plan does not cover; Review of patient status, including review of consultants reports, relevant laboratory and other test results, and medications completed; Reviewed recent blood sugars at home: reports consistent fasting blood sugars between 85-134; denies recent low blood sugars- can tell when blood sugars start to fall- eats several small frequent meals throughout day, which he has found helpful in preventing hypoglycemia at home Discussed/ provided education around signs/ symptoms low blood sugar and corresponding action plan- patient verbalizes good understanding of same Assessed patient's understanding of significance of A1-C values: he has a very good baseline understanding of same- reviewed with patient individual historical trends over time Encouraged patient to schedule prompt eye exam- he has not had annual eye exam in several years and has cataracts that he believes need to be surgically removed  Discussed with / provided education around self- dietary strategies for management of blood sugars:  patient has very good understanding of same at baseline- positive reinforcement provided with encouragement to continue efforts Discussed/ provided education around benefits of exercise: he is hopeful to re-join gym "soon"  Lab Results  Component Value Date   HGBA1C 6.6 (H) 11/21/2020  Patient Goals/Self-Care Activities: As evidenced by review of EHR, collaboration with care team, and patient reporting during CCM RN CM outreach, Patient Carlos will: Self administer medications as prescribed Attend all scheduled provider appointments Call pharmacy for medication refills Call provider office for new concerns or questions Continue to check fasting (first thing in the morning, before eating) blood sugars at home Continue to follow heart healthy, low salt, low cholesterol, carbohydrate-modified, low sugar diet Review enclosed educational material around cholesterol content in foods  Listen for a call from the [facility] to discuss possible resources for dental provider services that may be available Call the [facility] about any possible benefits you may have Schedule an appointment to have your eyes examined  Follow Up Plan:   Telephone follow up appointment with care management team member scheduled for: Friday, January 22, 2021 at 11:30 am The patient has been provided with contact information for the care management team and has been advised to call with any health related questions or concerns    Plan: The patient has been provided with contact information for the care management team and has been advised to call with any health related questions or concerns  [name], RN, BSN, [place] [phone]: direct office [phone]: mobile

## 2020-12-25 ENCOUNTER — Ambulatory Visit (INDEPENDENT_AMBULATORY_CARE_PROVIDER_SITE_OTHER): Payer: HMO | Admitting: Physician Assistant

## 2020-12-25 ENCOUNTER — Other Ambulatory Visit: Payer: Self-pay

## 2020-12-25 ENCOUNTER — Other Ambulatory Visit (INDEPENDENT_AMBULATORY_CARE_PROVIDER_SITE_OTHER): Payer: HMO

## 2020-12-25 ENCOUNTER — Encounter: Payer: Self-pay | Admitting: Internal Medicine

## 2020-12-25 ENCOUNTER — Encounter: Payer: Self-pay | Admitting: Physician Assistant

## 2020-12-25 VITALS — BP 120/78 | HR 63 | Ht 71.0 in | Wt 179.0 lb

## 2020-12-25 DIAGNOSIS — E559 Vitamin D deficiency, unspecified: Secondary | ICD-10-CM | POA: Diagnosis not present

## 2020-12-25 DIAGNOSIS — R140 Abdominal distension (gaseous): Secondary | ICD-10-CM

## 2020-12-25 DIAGNOSIS — I639 Cerebral infarction, unspecified: Secondary | ICD-10-CM | POA: Insufficient documentation

## 2020-12-25 DIAGNOSIS — R195 Other fecal abnormalities: Secondary | ICD-10-CM

## 2020-12-25 DIAGNOSIS — E119 Type 2 diabetes mellitus without complications: Secondary | ICD-10-CM

## 2020-12-25 DIAGNOSIS — K5900 Constipation, unspecified: Secondary | ICD-10-CM | POA: Diagnosis not present

## 2020-12-25 DIAGNOSIS — Z0000 Encounter for general adult medical examination without abnormal findings: Secondary | ICD-10-CM

## 2020-12-25 DIAGNOSIS — E538 Deficiency of other specified B group vitamins: Secondary | ICD-10-CM

## 2020-12-25 LAB — BASIC METABOLIC PANEL
BUN: 17 mg/dL (ref 6–23)
CO2: 28 mEq/L (ref 19–32)
Calcium: 9.8 mg/dL (ref 8.4–10.5)
Chloride: 102 mEq/L (ref 96–112)
Creatinine, Ser: 1.08 mg/dL (ref 0.40–1.50)
GFR: 66.72 mL/min (ref >60.00)
Glucose, Bld: 98 mg/dL (ref 70–99)
Potassium: 4.7 mEq/L (ref 3.5–5.1)
Sodium: 140 mEq/L (ref 135–145)

## 2020-12-25 LAB — HEPATIC FUNCTION PANEL
ALT: 14 U/L (ref 0–53)
AST: 26 U/L (ref 0–37)
Albumin: 4.6 g/dL (ref 3.5–5.2)
Alkaline Phosphatase: 53 U/L (ref 39–117)
Bilirubin, Direct: 0.1 mg/dL (ref 0.0–0.3)
Total Bilirubin: 0.5 mg/dL (ref 0.2–1.2)
Total Protein: 7.5 g/dL (ref 6.0–8.3)

## 2020-12-25 LAB — CBC WITH DIFFERENTIAL/PLATELET
Basophils Absolute: 0.1 10*3/uL (ref 0.0–0.1)
Basophils Absolute: 0 10*3/uL (ref 0.0–0.1)
Basophils Relative: 1.2 % (ref 0.0–3.0)
Basophils Relative: 1 % (ref 0.0–3.0)
Eosinophils Absolute: 0.2 10*3/uL (ref 0.0–0.7)
Eosinophils Absolute: 0.2 10*3/uL (ref 0.0–0.7)
Eosinophils Relative: 3.6 % (ref 0.0–5.0)
Eosinophils Relative: 3.6 % (ref 0.0–5.0)
HCT: 43.1 % (ref 39.0–52.0)
HCT: 42 % (ref 39.0–52.0)
Hemoglobin: 13.8 g/dL (ref 13.0–17.0)
Hemoglobin: 13.7 g/dL (ref 13.0–17.0)
Lymphocytes Relative: 13.8 % (ref 12.0–46.0)
Lymphocytes Relative: 14.5 % (ref 12.0–46.0)
Lymphs Abs: 0.7 10*3/uL (ref 0.7–4.0)
Lymphs Abs: 0.7 10*3/uL (ref 0.7–4.0)
MCHC: 32.1 g/dL (ref 30.0–36.0)
MCHC: 32.6 g/dL (ref 30.0–36.0)
MCV: 80.8 fl (ref 78.0–100.0)
MCV: 80.5 fl (ref 78.0–100.0)
Monocytes Absolute: 0.3 10*3/uL (ref 0.1–1.0)
Monocytes Absolute: 0.3 10*3/uL (ref 0.1–1.0)
Monocytes Relative: 5.4 % (ref 3.0–12.0)
Monocytes Relative: 6.5 % (ref 3.0–12.0)
Neutro Abs: 3.7 10*3/uL (ref 1.4–7.7)
Neutro Abs: 3.7 10*3/uL (ref 1.4–7.7)
Neutrophils Relative %: 76 % (ref 43.0–77.0)
Neutrophils Relative %: 74.4 % (ref 43.0–77.0)
Platelets: 186 10*3/uL (ref 150.0–400.0)
Platelets: 182 10*3/uL (ref 150.0–400.0)
RBC: 5.33 Mil/uL (ref 4.22–5.81)
RBC: 5.22 Mil/uL (ref 4.22–5.81)
RDW: 14.2 % (ref 11.5–15.5)
RDW: 14.5 % (ref 11.5–15.5)
WBC: 4.9 10*3/uL (ref 4.0–10.5)
WBC: 5 10*3/uL (ref 4.0–10.5)

## 2020-12-25 LAB — MICROALBUMIN / CREATININE URINE RATIO
Creatinine,U: 93.7 mg/dL
Microalb Creat Ratio: 1.1 mg/g (ref 0.0–30.0)
Microalb, Ur: 1 mg/dL (ref 0.0–1.9)

## 2020-12-25 LAB — COMPREHENSIVE METABOLIC PANEL
ALT: 15 U/L (ref 0–53)
AST: 27 U/L (ref 0–37)
Albumin: 4.6 g/dL (ref 3.5–5.2)
Alkaline Phosphatase: 52 U/L (ref 39–117)
BUN: 17 mg/dL (ref 6–23)
CO2: 28 mEq/L (ref 19–32)
Calcium: 9.9 mg/dL (ref 8.4–10.5)
Chloride: 103 mEq/L (ref 96–112)
Creatinine, Ser: 1.08 mg/dL (ref 0.40–1.50)
GFR: 66.72 mL/min (ref >60.00)
Glucose, Bld: 99 mg/dL (ref 70–99)
Potassium: 4.7 mEq/L (ref 3.5–5.1)
Sodium: 141 mEq/L (ref 135–145)
Total Bilirubin: 0.5 mg/dL (ref 0.2–1.2)
Total Protein: 7.9 g/dL (ref 6.0–8.3)

## 2020-12-25 LAB — VITAMIN B12: Vitamin B-12: 826 pg/mL (ref 211–911)

## 2020-12-25 LAB — LIPID PANEL
Cholesterol: 119 mg/dL (ref 0–200)
HDL: 57.5 mg/dL (ref >39.00)
LDL Cholesterol: 49 mg/dL (ref 0–99)
NonHDL: 61.36
Total CHOL/HDL Ratio: 2
Triglycerides: 63 mg/dL (ref 0.0–149.0)
VLDL: 12.6 mg/dL (ref 0.0–40.0)

## 2020-12-25 LAB — URINALYSIS, ROUTINE W REFLEX MICROSCOPIC
Bilirubin Urine: NEGATIVE
Hgb urine dipstick: NEGATIVE
Leukocytes,Ua: NEGATIVE
Nitrite: NEGATIVE
Renal Epithel, UA: NONE SEEN
Specific Gravity, Urine: 1.02 (ref 1.000–1.030)
Total Protein, Urine: NEGATIVE
Urine Glucose: NEGATIVE
Urobilinogen, UA: 0.2 (ref 0.0–1.0)
pH: 6 (ref 5.0–8.0)

## 2020-12-25 LAB — TSH
TSH: 0.48 u[IU]/mL (ref 0.35–5.50)
TSH: 0.48 u[IU]/mL (ref 0.35–5.50)

## 2020-12-25 LAB — VITAMIN D 25 HYDROXY (VIT D DEFICIENCY, FRACTURES): VITD: 43.54 ng/mL (ref 30.00–100.00)

## 2020-12-25 LAB — PSA: PSA: 0.02 ng/mL — ABNORMAL LOW (ref 0.10–4.00)

## 2020-12-25 LAB — HEMOGLOBIN A1C: Hgb A1c MFr Bld: 6.8 % — ABNORMAL HIGH (ref 4.6–6.5)

## 2020-12-25 NOTE — Progress Notes (Signed)
Subjective:    Patient ID: Samuel Krueger, male    DOB: 1945-06-11, 75 y.o.   MRN: [id number]  HPI [name] is a pleasant 75 year old African-American male, referred today by Dr. Cathlean Cower  for evaluation of new onset of significant constipation, change in stool caliber and intermittent dark stools. Patient had been seen here remotely for colonoscopy in 2004 per Dr. Fuller Plan with finding of a couple of very mild rectal erosions and internal hemorrhoids, otherwise negative exam.  He has not had prior EGD. Patient has history of adult onset diabetes mellitus, prostate cancer hyperlipidemia, is a recovering alcoholic.  He was admitted to the hospital on 11/20/2020 and diagnosed with a CVA.  MRI of the brain showed an acute infarct in the posterior limb of the left internal capsule.  Patient is now on Plavix and aspirin.  He says he has a little residual weakness but otherwise symptoms have completely resolved. Patient says his problems with constipation started prior to the CVA and have been present over the past couple of months.  He says he always used to be very regular and has become very constipated going at least 4 to 5 days between bowel movements.  When he does have a bowel movement he says he is not passing very much stool and his stool is very narrow.  He has not been noticing any bright red blood but has noticed dark stools intermittently over the past couple of months even prior to starting the Plavix and aspirin.  He says he feels full and bloated.  Appetite has been okay, no nausea or vomiting.  He has tried MiraLAX but has not been on it regularly and did not find it very helpful. Last labs done 11/20/2020 hemoglobin 13.6/hematocrit 43.6  Review of Systems.  Pertinent positive and negative review of systems were noted in the above HPI section.  All other review of systems was otherwise negative.   Outpatient Encounter Medications as of 12/25/2020  Medication Sig   ALPHA LIPOIC ACID PO Take  1 capsule by mouth daily.   atorvastatin (LIPITOR) 40 MG tablet Take 1 tablet (40 mg total) by mouth daily.   Blood Glucose Monitoring Suppl (ONETOUCH VERIO) w/Device KIT Use as directed twice daily E11.9   Cholecalciferol (VITAMIN D-3 PO) Take 1 capsule by mouth 2 (two) times a week.   clopidogrel (PLAVIX) 75 MG tablet Take 1 tablet (75 mg total) by mouth daily.   glipiZIDE (GLUCOTROL XL) 2.5 MG 24 hr tablet TAKE 1 TABLET (2.5 MG TOTAL) BY MOUTH DAILY WITH BREAKFAST.   glucose blood (ONETOUCH VERIO) test strip USE AS DIRECTED TWICE A DAY   Lancets MISC Use as directed twice daily E11.9   MAGNESIUM PO Take 1 tablet by mouth daily.   Multiple Vitamin (MULTIVITAMIN) tablet Take 1 tablet by mouth 3 (three) times a week.   polyethylene glycol powder (GLYCOLAX/MIRALAX) 17 GM/SCOOP powder Take 17 g by mouth 2 (two) times daily as needed.   Thiamine HCl (VITAMIN B-1 PO) Take 1 tablet by mouth daily.   vitamin C (ASCORBIC ACID) 500 MG tablet Take 500-1,000 mg by mouth daily.   No facility-administered encounter medications on file as of 12/25/2020.   Allergies  Allergen Reactions   Jardiance [Empagliflozin] Diarrhea   Metformin And Related Other (See Comments)    Chest tightness   Shrimp [Shellfish Allergy] Nausea Only and Other (See Comments)    "strange GI reaction"/stomach turns over- no breathing issues, however   Patient Active Problem  List   Diagnosis Date Noted   Slurred speech 11/20/2020   History of stroke 11/20/2020   Erectile dysfunction 07/23/2020   Carcinoma of prostate ([place]) 07/23/2020   Hypertrophy of prostate without urinary obstruction and other lower urinary tract symptoms (LUTS) 07/23/2020   Irregular heart beat 07/23/2020   Dark stools 07/23/2020   Constipation 07/25/2019   Left lumbar radiculopathy 07/25/2019   Vitamin D deficiency 01/14/2019   Hyperlipidemia 03/30/2016   Recovering alcoholic in remission [facility])    Prostate cancer ([place])    Type 2 diabetes, diet  controlled ([place])    S/P radiation therapy 07/15/2014   Elevated blood pressure reading without diagnosis of hypertension 11/05/2011   Alcohol use 11/02/2011   Social History   Socioeconomic History   Marital status: Widowed    Spouse name: single   Number of children: 1   Years of education: 12   Highest education level: Not on file  Occupational History   Occupation: retired  Tobacco Use   Smoking status: Former    Years: 0.80    Types: Cigarettes    Quit date: 07/07/2000    Years since quitting: 20.4   Smokeless tobacco: Never  Vaping Use   Vaping Use: Never used  Substance and Sexual Activity   Alcohol use: No    Comment: Recovering alcoholic since 8916   Drug use: No   Sexual activity: Not on file  Other Topics Concern   Not on file  Social History Narrative   Not on file   Social Determinants of Health   Financial Resource Strain: Not on file  Food Insecurity: No Food Insecurity   Worried About Running Out of Food in the Last Year: Never true   [place] in the Last Year: Never true  Transportation Needs: No Transportation Needs   Lack of Transportation (Medical): No   Lack of Transportation (Non-Medical): No  Physical Activity: Not on file  Stress: Not on file  Social Connections: Not on file  Intimate Partner Violence: Not on file    Mr. Wilsey's family history includes Alcohol abuse in his mother; HIV/AIDS in his brother; Heart disease in his mother; Hypertension in his mother; Mental illness in an other family member.      Objective:    Vitals:   12/25/20 0815  BP: 120/78  Pulse: 63    Physical Exam Well-developed well-nourished elderly AA male  in no acute distress.  Height, Weight, 179 BMI 24.9  HEENT; nontraumatic normocephalic, EOMI, PE R LA, sclera anicteric. Oropharynx;not examined Neck; supple, no JVD Cardiovascular; regular rate and rhythm with S1-S2, no murmur rub or gallop Pulmonary; Clear bilaterally Abdomen; soft,  nontender, nondistended, but somewhat  full feeling -no palpable mass or hepatosplenomegaly, bowel sounds are hyperactive Rectal; no external lesion, no lesion palpable on digital exam, no fecal impaction, stool is brown and heme-negative Skin; benign exam, no jaundice rash or appreciable lesions Extremities; no clubbing cyanosis or edema skin warm and dry Neuro/Psych; alert and oriented x4, grossly nonfocal mood and affect appropriate        Assessment & Plan:   #42 75 year old male with new onset of significant constipation over the past 2 months, narrower caliber stools and intermittent dark stools by patient report.  Stools today brown and heme-negative.  Also complaining of fullness and bloating.  Very remote colonoscopy 2004 negative except internal hemorrhoids  Rule out occult neoplasm, colonic stricture, less likely functional constipation.  Intermittent dark stools on Plavix, could also  represent upper GI source of blood loss i.e. gastritis, AVMs, peptic ulcer disease  #2 very recent CVA October 2022-on Plavix and aspirin #3 adult onset diabetes mellitus 4.  History of prostate cancer 5.  Hyperlipidemia 6.  Former history of EtOH abuse.  Plan; Patient will be given a bowel purge with MiraLAX, then start MiraLAX 17 g in 8 ounces of water twice daily every day. Start Senokot  S 1-2 daily at bedtime Patient advised to increase water intake to 6 to 8 glasses/day. Scheduled for CT scan of the abdomen and pelvis with contrast-intentionally holding off on endoscopic evaluation as initial work-up due to very recent CVA.  Expect he will eventually need colonoscopy and EGD pending results of CT.  CBC with differential, c-Met, TSH today     Kasandra Fehr S Caila Cirelli PA-C 12/25/2020   Cc: Biagio Borg, MD

## 2020-12-25 NOTE — Patient Instructions (Signed)
If you are age 76 or younger, your body mass index should be between 19-25. Your Body mass index is 24.97 kg/m. If this is out of the aformentioned range listed, please consider follow up with your Primary Care Provider.  ________________________________________________________  The [facility] providers would like to encourage you to use [facility] to communicate with providers for non-urgent requests or questions.  Due to long hold times on the telephone, sending your provider a message by [facility] may be a faster and more efficient way to get a response.  Please allow 48 business hours for a response.  Please remember that this is for non-urgent requests.  _______________________________________________________  Samuel Krueger have been scheduled for a CT scan of the abdomen and pelvis at [facility], 1st floor Radiology. You are scheduled on 12/30/2020  at 4:30 pm. You should arrive 15 minutes prior to your appointment time for registration.  Please pick up 2 bottles of contrast from [place] at least 3 days prior to your scan. The solution may taste better if refrigerated, but do NOT add ice or any other liquid to this solution. Shake well before drinking.   Please follow the written instructions below on the day of your exam:   1) Do not eat anything after 12:30 pm (4 hours prior to your test)   2) Drink 1 bottle of contrast @ 2:30 pm (2 hours prior to your exam)  Remember to shake well before drinking and do NOT pour over ice.     Drink 1 bottle of contrast @ 3:30 pm (1 hour prior to your exam)   You may take any medications as prescribed with a small amount of water, if necessary. If you take any of the following medications: METFORMIN, GLUCOPHAGE, GLUCOVANCE, AVANDAMET, RIOMET, FORTAMET, [place] MET, JANUMET, GLUMETZA or METAGLIP, you MAY be asked to HOLD this medication 48 hours AFTER the exam.   The purpose of you drinking the oral contrast is to aid in the visualization of your intestinal  tract. The contrast solution may cause some diarrhea. Depending on your individual set of symptoms, you may also receive an intravenous injection of x-ray contrast/dye. Plan on being at [facility] for 45 minutes or longer, depending on the type of exam you are having performed.   If you have any questions regarding your exam or if you need to reschedule, you may call [facility] at [phone] between the hours of 8:00 am and 5:00 pm, Monday-Friday.   Your provider has requested that you go to the basement level for lab work before leaving today. Press "B" on the elevator. The lab is located at the first door on the left as you exit the elevator.  Amy Esterwood, PA-C  recommends that you complete a bowel purge (to clean out your bowels). Please do the following: Purchase a bottle of Miralax over the counter as well as a box of 5 mg dulcolax tablets. Take 4 dulcolax tablets. Wait 1 hour. You will then drink 6-8 capfuls of Miralax mixed in an adequate amount of water/juice/gatorade (you may choose which of these liquids to drink) over the next 2-3 hours. You should expect results within 1 to 6 hours after completing the bowel purge.  After you complete your bowel purge start Senokat-S 2 tablets at bedtime and Miralax 1 capful in 8 ounces in water/juice twice daily.  Increase your water intake to 6 glasses a day.  Follow up pending the results of your Ct.  Thank you for  entrusting me with your care and choosing [facility].  Amy Esterwood, PA-C

## 2020-12-28 NOTE — Progress Notes (Signed)
Reviewed and agree with management plan.  Ensley Blas T. Exilda Wilhite, MD FACG 

## 2020-12-29 ENCOUNTER — Inpatient Hospital Stay: Payer: Commercial Managed Care - HMO | Admitting: Adult Health

## 2020-12-30 ENCOUNTER — Ambulatory Visit (HOSPITAL_COMMUNITY): Payer: HMO

## 2021-01-04 ENCOUNTER — Ambulatory Visit (HOSPITAL_COMMUNITY)
Admission: RE | Admit: 2021-01-04 | Discharge: 2021-01-04 | Disposition: A | Discharge status: Home / Self Care | Payer: HMO | Source: Ambulatory Visit | Attending: Physician Assistant | Admitting: Physician Assistant

## 2021-01-04 ENCOUNTER — Other Ambulatory Visit: Payer: Self-pay

## 2021-01-04 DIAGNOSIS — N400 Enlarged prostate without lower urinary tract symptoms: Secondary | ICD-10-CM | POA: Diagnosis not present

## 2021-01-04 DIAGNOSIS — K5900 Constipation, unspecified: Secondary | ICD-10-CM | POA: Diagnosis not present

## 2021-01-04 DIAGNOSIS — R195 Other fecal abnormalities: Secondary | ICD-10-CM

## 2021-01-04 DIAGNOSIS — R140 Abdominal distension (gaseous): Secondary | ICD-10-CM

## 2021-01-04 DIAGNOSIS — K429 Umbilical hernia without obstruction or gangrene: Secondary | ICD-10-CM | POA: Diagnosis not present

## 2021-01-04 MED ORDER — IOHEXOL 350 MG/ML SOLN
80.0000 mL | Freq: Once | INTRAVENOUS | Status: AC | PRN
  Administered 2021-01-04: 15:00:00 80 mL via INTRAVENOUS

## 2021-01-06 DIAGNOSIS — E7800 Pure hypercholesterolemia, unspecified: Secondary | ICD-10-CM | POA: Diagnosis not present

## 2021-01-06 DIAGNOSIS — E1169 Type 2 diabetes mellitus with other specified complication: Secondary | ICD-10-CM | POA: Diagnosis not present

## 2021-01-06 DIAGNOSIS — Z7984 Long term (current) use of oral hypoglycemic drugs: Secondary | ICD-10-CM | POA: Diagnosis not present

## 2021-01-18 ENCOUNTER — Ambulatory Visit: Payer: HMO | Admitting: *Deleted

## 2021-01-18 ENCOUNTER — Encounter: Payer: Self-pay | Admitting: Occupational Therapy

## 2021-01-18 DIAGNOSIS — E7800 Pure hypercholesterolemia, unspecified: Secondary | ICD-10-CM

## 2021-01-18 DIAGNOSIS — Z8673 Personal history of transient ischemic attack (TIA), and cerebral infarction without residual deficits: Secondary | ICD-10-CM

## 2021-01-18 DIAGNOSIS — E119 Type 2 diabetes mellitus without complications: Secondary | ICD-10-CM

## 2021-01-18 NOTE — Chronic Care Management (AMB) (Signed)
Chronic Care Management   CCM RN Visit Note  01/18/2021 Name: Samuel Krueger MRN: [id number] DOB: 02-22-1945  Subjective: Samuel Krueger is a 75 y.o. year old male who is a primary care patient of Biagio Borg, MD. The care management team was consulted for assistance with disease management and care coordination needs.    Engaged with patient by telephone for  acute/ unscheduled outreach  in response to provider referral for case management and/or care coordination services.   Consent to Services:  The patient was given information about Chronic Care Management services, agreed to services, and gave verbal consent prior to initiation of services.  Please see initial visit note for detailed documentation.  Patient agreed to services and verbal consent obtained.   Assessment: Review of patient past medical history, allergies, medications, health status, including review of consultants reports, laboratory and other test data, was performed as part of comprehensive evaluation and provision of chronic care management services.   CCM Care Plan  Allergies  Allergen Reactions   Jardiance [Empagliflozin] Diarrhea   Metformin And Related Other (See Comments)    Chest tightness   Shrimp [Shellfish Allergy] Nausea Only and Other (See Comments)    "strange GI reaction"/stomach turns over- no breathing issues, however   Outpatient Encounter Medications as of 01/18/2021  Medication Sig   ALPHA LIPOIC ACID PO Take 1 capsule by mouth daily.   atorvastatin (LIPITOR) 40 MG tablet Take 1 tablet (40 mg total) by mouth daily.   Blood Glucose Monitoring Suppl (ONETOUCH VERIO) w/Device KIT Use as directed twice daily E11.9   Cholecalciferol (VITAMIN D-3 PO) Take 1 capsule by mouth 2 (two) times a week.   clopidogrel (PLAVIX) 75 MG tablet Take 1 tablet (75 mg total) by mouth daily.   glipiZIDE (GLUCOTROL XL) 2.5 MG 24 hr tablet TAKE 1 TABLET (2.5 MG TOTAL) BY MOUTH DAILY WITH BREAKFAST.   glucose  blood (ONETOUCH VERIO) test strip USE AS DIRECTED TWICE A DAY   Lancets MISC Use as directed twice daily E11.9   MAGNESIUM PO Take 1 tablet by mouth daily.   Multiple Vitamin (MULTIVITAMIN) tablet Take 1 tablet by mouth 3 (three) times a week.   polyethylene glycol powder (GLYCOLAX/MIRALAX) 17 GM/SCOOP powder Take 17 g by mouth 2 (two) times daily as needed.   Thiamine HCl (VITAMIN B-1 PO) Take 1 tablet by mouth daily.   vitamin C (ASCORBIC ACID) 500 MG tablet Take 500-1,000 mg by mouth daily.   No facility-administered encounter medications on file as of 01/18/2021.   Patient Active Problem List   Diagnosis Date Noted   CVA (cerebral vascular accident) ([place]) 12/25/2020   Slurred speech 11/20/2020   History of stroke 11/20/2020   Erectile dysfunction 07/23/2020   Carcinoma of prostate ([place]) 07/23/2020   Hypertrophy of prostate without urinary obstruction and other lower urinary tract symptoms (LUTS) 07/23/2020   Irregular heart beat 07/23/2020   Dark stools 07/23/2020   Constipation 07/25/2019   Left lumbar radiculopathy 07/25/2019   Vitamin D deficiency 01/14/2019   Hyperlipidemia 03/30/2016   Recovering alcoholic in remission [facility])    Prostate cancer ([place])    Type 2 diabetes, diet controlled ([place])    S/P radiation therapy 07/15/2014   Elevated blood pressure reading without diagnosis of hypertension 11/05/2011   Alcohol use 11/02/2011   Conditions to be addressed/monitored:  HLD, DMII, and recent CVA  Care Plan : RN Care Manager Plan of Care  Updates made by [name], RN since  01/18/2021 12:00 AM     Problem: Chronic Disease Management Needs   Priority: High     Long-Range Goal: Development of plan of care for long term chronic disease management   Start Date: 12/22/2020  Expected End Date: 12/22/2021  Priority: High  Note:   Current Barriers:  Chronic Disease Management support and education needs related to HLD and DMII Recent hospitalization October 14-16,  2022 for TIA/ CVA- no deficits/ residual identified  RNCM Clinical Goal(s):  Patient will demonstrate Ongoing health management independence DMII; HLD  through collaboration with RN Care manager, provider, and care team.   Interventions: 1:1 collaboration with primary care provider regarding development and update of comprehensive plan of care as evidenced by provider attestation and co-signature Inter-disciplinary care team collaboration (see longitudinal plan of care) Evaluation of current treatment plan related to  self management and patient's adherence to plan as established by provider 01/18/21: Acute/ unscheduled incoming call from patient, requesting call back: Patient describes new onset positional neck pain "over last 2-3 days;" states he believes this pain is musculoskeletal in nature, says initially started "whole back of neck," now reports "it's mainly just on the (L) side" Denies new injuries/ falls, states "just happened" Has been taking ibuprofen and tylenol with minimal relief- states positional changes relieves pain, currently denies pain, "because I am holding my neck just so" Advised patient to call Dr. Gwynn Burly office schedulers to schedule urgent visit if pain continues/ worsens: explained that Dr. Jenny Reichmann would not just call in any medications without first evaluating patient is office visit: he will consider scheduling with Dr. Jenny Reichmann  From 12/22/20 outreach:   Hyperlipidemia Interventions: Provider established cholesterol goals reviewed; Counseled on importance of regular laboratory monitoring as prescribed; Provided HLD educational materials; Reviewed role and benefits of statin for ASCVD risk reduction; Reviewed importance of limiting foods high in cholesterol; Reviewed exercise goals and target of 150 minutes per week; Assessed social determinant of health barriers;  Discussed recent hospitalization with patient- reports back to baseline, has resumed normal; activities,  including driving self Confirmed no medication concerns-- independently self- manages, does not use pill box, has personal organization system; declines full medication review; verbalizes very good understanding of purpose of meds and denies questions Discussed need for prescription refills for Atorvastatin- only has 2 days left; needs refill for clopidogrel-- took last dose today- called his outpatient pharmacy several days ago, needs medications refilled right away to avoid missing doses-- will make PCP aware Confirmed obtained flu vaccine for 2022-23 flu/ winter season at PCP office visit 11/25/20; discussed/ provided education around infection prevention measures Pain Assessment completed; patient denies acute/ chronic pain  Diabetes Interventions: Assessed patient's understanding of A1c goal: <6.5% Provided education to patient about basic DM disease process; Counseled on importance of regular laboratory monitoring as prescribed; Discussed plans with patient for ongoing care management follow up and provided patient with direct contact information for care management team; Reviewed scheduled/upcoming provider appointments including: 12/25/20- GI provider; 12/29/20- neurologist; Referral made to community resources care guide team for assistance with dental resources- needs several extractions, insurance plan does not cover; Review of patient status, including review of consultants reports, relevant laboratory and other test results, and medications completed; Reviewed recent blood sugars at home: reports consistent fasting blood sugars between 85-134; denies recent low blood sugars- can tell when blood sugars start to fall- eats several small frequent meals throughout day, which he has found helpful in preventing hypoglycemia at home Discussed/ provided education around signs/ symptoms  low blood sugar and corresponding action plan- patient verbalizes good understanding of same Assessed patient's  understanding of significance of A1-C values: he has a very good baseline understanding of same- reviewed with patient individual historical trends over time Encouraged patient to schedule prompt eye exam- he has not had annual eye exam in several years and has cataracts that he believes need to be surgically removed Discussed with / provided education around self- dietary strategies for management of blood sugars: patient has very good understanding of same at baseline- positive reinforcement provided with encouragement to continue efforts Discussed/ provided education around benefits of exercise: he is hopeful to re-join gym "soon"  Lab Results  Component Value Date   HGBA1C 6.6 (H) 11/21/2020  Patient Goals/Self-Care Activities: As evidenced by review of EHR, collaboration with care team, and patient reporting during CCM RN CM outreach,  Patient [place] will: Continue taking medications as prescribed Attend all scheduled provider appointments Call pharmacy for medication refills Call provider office for new concerns or questions: if your neck pain continues, you may need to schedule an appointment with Dr. Jenny Reichmann Continue to check fasting (first thing in the morning, before eating) blood sugars at home Continue to follow heart healthy, low salt, low cholesterol, carbohydrate-modified, low sugar diet Review enclosed educational material around cholesterol content in foods  Listen for a call from the [place] to discuss possible resources for dental provider services that may be available Call the [facility] about any possible benefits you may have Schedule an appointment to have your eyes examined Follow Up Plan:   Telephone follow up appointment with care management team member scheduled for: Friday, January 22, 2021 at 11:30 am The patient has been provided with contact information for the care management team and has been advised to call with any health related questions or  concerns    Plan: The patient has been provided with contact information for the care management team and has been advised to call with any health related questions or concerns  [name], RN, BSN, [place] [phone]: direct office

## 2021-01-18 NOTE — Patient Instructions (Signed)
Visit Information  Laura, thank you for taking time to talk with me today. Please don't hesitate to contact me if I can be of assistance to you before our next scheduled telephone appointment.  Below are the goals we discussed today:  Patient Self-Care Activities: Patient Samuel Krueger will: Continue taking medications as prescribed Attend all scheduled provider appointments Call pharmacy for medication refills Call provider office for new concerns or questions: if your neck pain continues, you may need to schedule an appointment with Dr. Jenny Reichmann Continue to check fasting (first thing in the morning, before eating) blood sugars at home Continue to follow heart healthy, low salt, low cholesterol, carbohydrate-modified, low sugar diet Review enclosed educational material around cholesterol content in foods  Listen for a call from the [place] to discuss possible resources for dental provider services that may be available Call the [facility] about any possible benefits you may have Schedule an appointment to have your eyes examined  Our next scheduled telephone follow up visit/ appointment with care management team member is scheduled on:  Friday, January 22, 2021 at 11:30 am  If you need to cancel or re-schedule our visit, please call [phone] and our care guide team will be happy to assist you.   I look forward to hearing about your progress.   [name], RN, BSN, [place] [phone]: direct office  If you are experiencing a Mental Health or [place] or need someone to talk to, please  call the Suicide and Crisis Lifeline: 988 call the [place] National Suicide Prevention Lifeline: (985) 280-2111 or TTY: 267 753 5270 TTY 917-424-9733) to talk to a trained counselor call 1-800-273-TALK (toll free, 24 hour hotline) go to [facility] [address] [place]  [phone]) call 911   Patient verbalizes understanding of instructions provided today and agrees to view in MyChart  Muscle Pain, Adult Muscle pain, also called myalgia, is a condition in which a person has pain in one or more muscles in the body. Muscle pain may be mild, moderate, or severe. It may feel sharp, achy, or burning. In most cases, the pain lasts only a short time and goes away without treatment. Muscle pain can result from using muscles in a new or different way or after a period of inactivity. It is normal to feel some muscle pain after starting an exercise program. Muscles that have not been used often will be sore at first. What are the causes? This condition is caused by using muscles in a new or different way after a period of inactivity. Other causes may include: Overuse or muscle strain, especially if you are not in shape. This is the most common cause of muscle pain. Injury or bruising. Infectious diseases, including diseases caused by viruses, such as the flu (influenza). Fibromyalgia.This is a long-term, or chronic, condition that causes muscle tenderness, tiredness (fatigue), and headache. Autoimmune or rheumatologic diseases. These are conditions, such as lupus, in which the body's defense system (immunesystem) attacks areas in the body. Certain medicines, including ACE inhibitors and statins. What are the signs or symptoms? The main symptom of this condition is sore or painful muscles, including during activity and when stretching. You may also have slight swelling. How is this diagnosed? This condition is diagnosed with a physical exam. Your health care provider will ask questions about your pain and when it began. If you have not had muscle pain for very long, your  health care provider may want to wait before doing much testing. If your muscle pain has lasted a long time, tests may be done right away. In some cases, this may include tests to rule out certain  conditions or illnesses. How is this treated? Treatment for this condition depends on the cause. Home care is often enough to relieve muscle pain. Your health care provider may also prescribe NSAIDs, such as ibuprofen. Follow these instructions at home: Medicines Take over-the-counter and prescription medicines only as told by your health care provider. Ask your health care provider if the medicine prescribed to you requires you to avoid driving or using machinery. Managing pain, swelling, and discomfort   If directed, put ice on the painful area. To do this: Put ice in a plastic bag. Place a towel between your skin and the bag. Leave the ice on for 20 minutes, 2-3 times a day. For the first 2 days of muscle soreness, or if there is swelling: Do not soak in hot baths. Do not use a hot tub, steam room, sauna, heating pad, or other heat source. After 48-72 hours, you may alternate between applying ice and applying heat as told by your health care provider. If directed, apply heat to the affected area as often as told by your health care provider. Use the heat source that your health care provider recommends, such as a moist heat pack or a heating pad. Place a towel between your skin and the heat source. Leave the heat on for 20-30 minutes. Remove the heat if your skin turns bright red. This is especially important if you are unable to feel pain, heat, or cold. You may have a greater risk of getting burned. If you have an injury, raise (elevate) the injured area above the level of your heart while you are sitting or lying down. Activity  If overuse is causing your muscle pain: Slow down your activities until the pain goes away. Do regular, gentle exercises if you are not usually active. Warm up before exercising. Stretch before and after exercising. This can help lower the risk of muscle pain. Do not continue working out if the pain is severe. Severe pain could mean that you have injured a  muscle. Do not lift anything that is heavier than 5-10 lb (2.3-4.5 kg), or the limit that you are told, until your health care provider says that it is safe. Return to your normal activities as told by your health care provider. Ask your health care provider what activities are safe for you. General instructions Do not use any products that contain nicotine or tobacco, such as cigarettes, e-cigarettes, and chewing tobacco. These can delay healing. If you need help quitting, ask your health care provider. Keep all follow-up visits as told by your health care provider. This is important. Contact a health care provider if you have: Muscle pain that gets worse and medicines do not help. Muscle pain that lasts longer than 3 days. A rash or fever along with muscle pain. Muscle pain after a tick bite. Muscle pain while working out, even though you are in good physical condition. Redness, soreness, or swelling along with muscle pain. Muscle pain after starting a new medicine or changing the dose of a medicine. Get help right away if you have: Trouble breathing. Trouble swallowing. Muscle pain along with a stiff neck, fever, and vomiting. Severe muscle weakness or you cannot move part of your body. These symptoms may represent a serious problem that is an  emergency. Do not wait to see if the symptoms will go away. Get medical help right away. Call your local emergency services (911 in the [place] Do not drive yourself to the hospital. Summary Muscle pain usually lasts only a short time and goes away without treatment. This condition is caused by using muscles in a new or different way after a period of inactivity. If your muscle pain lasts longer than 3 days, tell your health care provider. This information is not intended to replace advice given to you by your health care provider. Make sure you discuss any questions you have with your health care provider. Document Revised: 08/14/2020 Document  Reviewed: 08/14/2020 Elsevier Patient Education  [place].

## 2021-01-19 ENCOUNTER — Telehealth: Payer: Self-pay | Admitting: *Deleted

## 2021-01-19 NOTE — Telephone Encounter (Signed)
   Telephone encounter was:  Successful.  01/19/2021 Name: Samuel Krueger MRN: [id number] DOB: 1945-08-29  Samuel Krueger is a 75 y.o. year old male who is a primary care patient of Jenny Reichmann, Hunt Oris, MD . The community resource team was consulted for assistance with patient wanting affordable dental alternatives to be sent to his email   Care guide performed the following interventions: Patient provided with information about care guide support team and interviewed to confirm resource needs Follow up call placed to community resources to determine status of patients referral.  Follow Up Plan:  No further follow up planned at this time. The patient has been provided with needed resources.  [place], Care Management  [phone] [address] , [place] [address] Email : [email or web address]. [email or web address]

## 2021-01-21 ENCOUNTER — Ambulatory Visit: Payer: HMO | Admitting: Occupational Therapy

## 2021-01-22 ENCOUNTER — Ambulatory Visit (INDEPENDENT_AMBULATORY_CARE_PROVIDER_SITE_OTHER): Payer: HMO | Admitting: *Deleted

## 2021-01-22 DIAGNOSIS — E119 Type 2 diabetes mellitus without complications: Secondary | ICD-10-CM

## 2021-01-22 DIAGNOSIS — E7800 Pure hypercholesterolemia, unspecified: Secondary | ICD-10-CM

## 2021-01-22 NOTE — Patient Instructions (Addendum)
Visit Information  [place], thank you for taking time to talk with me today. Please don't hesitate to contact me if I can be of assistance to you before our next scheduled telephone appointment.  Below are the goals we discussed today:  Patient Self-Care Activities: Patient [place] will: Continue taking medications as prescribed Attend all scheduled provider appointments Call pharmacy for medication refills Call provider office for new concerns or questions: I am glad you have made an appointment with Dr. Jenny Reichmann about the neck pain you are experieincing Continue to check fasting (first thing in the morning, before eating) blood sugars at home several times each week Continue to follow heart healthy, low salt, low cholesterol, carbohydrate-modified, low sugar diet Call the resources you have been provided for dental provider services that may be available  for you Call the [facility] about any possible benefits you may have Schedule an appointment to have your eyes examined  Our next scheduled telephone follow up visit/ appointment with care management team member is scheduled on:  Tuesday, February 23, 2021 at 11:30 am  If you need to cancel or re-schedule our visit, please call [phone] and our care guide team will be happy to assist you.   I look forward to hearing about your progress.   [name], RN, BSN, [place] [phone]: direct office  If you are experiencing a Mental Health or [place] or need someone to talk to, please  call the Suicide and Crisis Lifeline: 988 call the [place] National Suicide Prevention Lifeline: 325 384 7065 or TTY: (217)007-3190 TTY 6187897623) to talk to a trained counselor call 1-800-273-TALK (toll free, 24 hour hotline) go to [facility] [address], [place] [phone]) call 911   Patient verbalizes understanding of  instructions provided today and agrees to view in [place] With Diabetes Diabetes (type 1 diabetes mellitus or type 2 diabetes mellitus) is a condition in which the body does not have enough of a hormone called insulin, or the body does not respond properly to insulin. Normally, insulin allows sugars (glucose) to enter cells in the body. With diabetes, extra glucose builds up in the blood instead of going into cells. This results in high blood glucose (hyperglycemia). How to manage lifestyle changes Managing diabetes includes medical treatments as well as lifestyle changes. If diabetes is not managed well, serious physical and emotional complications can occur. Taking good care of yourself means that you are responsible for: Monitoring glucose regularly. Eating a healthy diet. Exercising regularly. Meeting with health care providers. Taking medicines as directed. Most people feel some stress about managing their diabetes. When this stress becomes too much, it is known as diabetes-related distress. This is very common. Living with diabetes can place you at risk for diabetes distress, depression, or anxiety. These disorders can make diabetes more difficult to manage. How to recognize stress You may have diabetes distress if you: Avoid or ignore your daily diabetes care. This includes glucose testing, following a meal plan, and taking medications. Feel overwhelmed by your daily diabetes care. Experience emotional reactions such as anger, sadness, or fear related to your daily diabetes care. Feel fear or shame about not doing everything perfectly that you have been told to do. Emotional distress Symptoms of diabetes distress include: Anger about having a diagnosis of diabetes. Fear or frustration about your diagnosis and the changes you need to make to manage the condition. Being overly worried about  the care that you need or the cost of the care that you need. Feeling like you caused your  condition by doing something wrong. Fear about unpredictable fluctuations in your blood glucose, like low or high blood glucose. Feeling judged by your health care providers. Feeling very alone with the disease. Depression Having diabetes means that you are at a higher risk for depression. Your health care provider may test (screen) you for symptoms of depression. It is important to recognize symptoms and to start treatment for depression soon after it is diagnosed. The following are some symptoms of depression: Loss of interest in things that you used to enjoy. Feeling depressed much or most of the time. A change in appetite. Trouble getting to sleep or staying asleep. Feeling tired most of the day. Feeling nervous and anxious. Feeling guilty and worrying that you are a burden to others. Having thoughts of hurting yourself or feeling that you want to die. If you have any of these symptoms, more days than not, for 2 weeks or longer, you may have depression. This would be a good time to contact your health care provider. Follow these instructions at home: Managing diabetes distress The following are some ways to manage emotional distress: Learn as much as you can about diabetes and its treatment. Take one step at a time to improve your management. Meet with a certified diabetes care and education specialist. Take a class to learn how to manage your condition. Consider working with a counselor or therapist. Keep a journal of your thoughts and concerns. Accept that some things are out of your control. Talk with other people who have diabetes. It can help to talk about the distress that you feel. Find ways to manage stress that work for you. These may include art or music therapy, exercise, meditation, and hobbies. Seek support from spiritual leaders, family, and friends.  General instructions Do your best to follow your diabetes management plan. If you are struggling to follow your plan,  talk with a certified diabetes care and education specialist, or with someone else who has diabetes. They may have ideas that will help. Forgive yourself for not being perfect. Almost everyone struggles with the tasks of diabetes. Keep all follow-up visits. This is important. Where to find support Search for information and support from the American Diabetes Association: www.diabetes.org Find a certified diabetes education and care specialist. Make an appointment through the Association of Diabetes Care & Education Specialists: www.diabeteseducator.org Contact a health care provider if: You believe your diabetes is getting out of control. You are concerned you may be depressed. You think your medications are not helping control your diabetes. You are feeling overwhelmed with your diabetes. Get help right away if: You have thoughts about hurting yourself or others. If you ever feel like you may hurt yourself or others, or have thoughts about taking your own life, get help right away. You can go to your nearest emergency department or call: Your local emergency services (911 in the [place] A suicide crisis helpline, such as the [place] at 870-511-0769 or 988 in the [place]. This is open 24 hours a day. Summary Diabetes (type 1 diabetes mellitus or type 2 diabetes mellitus) is a condition in which the body does not have enough of a hormone called insulin, or the body does not respond properly to insulin. Living with diabetes puts you at risk for medical and emotional issues, such as diabetes distress, depression, and anxiety. Recognizing the symptoms of  diabetes distress and depression may help you avoid problems with your diabetes control. If you experience symptoms, it is important to discuss this with your health care provider, certified diabetes care and education specialist, or therapist. It is important to start treatment for diabetes distress and depression soon  after diagnosis. Ask your health care provider to recommend a therapist who understands both depression and diabetes. This information is not intended to replace advice given to you by your health care provider. Make sure you discuss any questions you have with your health care provider. Document Revised: 08/19/2020 Document Reviewed: 06/06/2019 Elsevier Patient Education  [place].

## 2021-01-22 NOTE — Chronic Care Management (AMB) (Signed)
Chronic Care Management   CCM RN Visit Note  01/22/2021 Name: Samuel Krueger MRN: [id number] DOB: 17-Jan-1946  Subjective: Samuel Krueger is a 75 y.o. year old male who is a primary care patient of Samuel Borg, MD. The care management team was consulted for assistance with disease management and care coordination needs.    Engaged with patient by telephone for follow up visit in response to provider referral for case management and/or care coordination services.   Consent to Services:  The patient was given information about Chronic Care Management services, agreed to services, and gave verbal consent prior to initiation of services.  Please see initial visit note for detailed documentation.  Patient agreed to services and verbal consent obtained.   Assessment: Review of patient past medical history, allergies, medications, health status, including review of consultants reports, laboratory and other test data, was performed as part of comprehensive evaluation and provision of chronic care management services.   CCM Care Plan Allergies  Allergen Reactions   Jardiance [Empagliflozin] Diarrhea   Metformin And Related Other (See Comments)    Chest tightness   Shrimp [Shellfish Allergy] Nausea Only and Other (See Comments)    "strange GI reaction"/stomach turns over- no breathing issues, however    Outpatient Encounter Medications as of 01/22/2021  Medication Sig   ALPHA LIPOIC ACID PO Take 1 capsule by mouth daily.   atorvastatin (LIPITOR) 40 MG tablet Take 1 tablet (40 mg total) by mouth daily.   Blood Glucose Monitoring Suppl (ONETOUCH VERIO) w/Device KIT Use as directed twice daily E11.9   Cholecalciferol (VITAMIN D-3 PO) Take 1 capsule by mouth 2 (two) times a week.   glipiZIDE (GLUCOTROL XL) 2.5 MG 24 hr tablet TAKE 1 TABLET (2.5 MG TOTAL) BY MOUTH DAILY WITH BREAKFAST.   glucose blood (ONETOUCH VERIO) test strip USE AS DIRECTED TWICE A DAY   Lancets MISC Use as directed  twice daily E11.9   MAGNESIUM PO Take 1 tablet by mouth daily.   Multiple Vitamin (MULTIVITAMIN) tablet Take 1 tablet by mouth 3 (three) times a week.   polyethylene glycol powder (GLYCOLAX/MIRALAX) 17 GM/SCOOP powder Take 17 g by mouth 2 (two) times daily as needed.   Thiamine HCl (VITAMIN B-1 PO) Take 1 tablet by mouth daily.   vitamin C (ASCORBIC ACID) 500 MG tablet Take 500-1,000 mg by mouth daily.   No facility-administered encounter medications on file as of 01/22/2021.   Patient Active Problem List   Diagnosis Date Noted   CVA (cerebral vascular accident) ([place]) 12/25/2020   Slurred speech 11/20/2020   History of stroke 11/20/2020   Erectile dysfunction 07/23/2020   Carcinoma of prostate ([place]) 07/23/2020   Hypertrophy of prostate without urinary obstruction and other lower urinary tract symptoms (LUTS) 07/23/2020   Irregular heart beat 07/23/2020   Dark stools 07/23/2020   Constipation 07/25/2019   Left lumbar radiculopathy 07/25/2019   Vitamin D deficiency 01/14/2019   Hyperlipidemia 03/30/2016   Recovering alcoholic in remission [facility])    Prostate cancer ([name])    Type 2 diabetes, diet controlled ([place])    S/P radiation therapy 07/15/2014   Elevated blood pressure reading without diagnosis of hypertension 11/05/2011   Alcohol use 11/02/2011   Conditions to be addressed/monitored:  HLD, recent CVA, and DMII  Care Plan : RN Care Manager Plan of Care  Updates made by Samuel Royalty, RN since 01/22/2021 12:00 AM     Problem: Chronic Disease Management Needs   Priority: High  Long-Range Goal: Development of plan of care for long term chronic disease management   Start Date: 12/22/2020  Expected End Date: 12/22/2021  Priority: High  Note:   Current Barriers:  Chronic Disease Management support and education needs related to HLD and DMII Recent hospitalization October 14-16, 2022 for TIA/ CVA- no deficits/ residual identified Poor dentition: 12/22/20- community  resource care guide referral placed-- 01/22/21- patient confirms he has been provided with dental resources; verbalizes plans to follow up on resources after holidays are over  Samuel Krueger):  Patient will demonstrate Ongoing health management independence DMII; HLD  through collaboration with RN Care manager, provider, and care team.   Interventions: 1:1 collaboration with primary care provider regarding development and update of comprehensive plan of care as evidenced by provider attestation and co-signature Inter-disciplinary care team collaboration (see longitudinal plan of care) Evaluation of current treatment plan related to  self management and patient's adherence to plan as established by provider Initial assessment completed 01/22/21  Hyperlipidemia Interventions:  GOAL status: ON TRACK; Long Term goal Reviewed role and benefits of statin for ASCVD risk reduction Reviewed importance of limiting foods high in cholesterol Reviewed exercise goals and target of 150 minutes per week Confirmed no medication concerns-- confirms he picked up refills for Atorvastatin and clopidogrel, after care coordination outreach to PCP 12/22/20 Pain Assessment completed; patient reports recently reported (01/18/21) acute neck pain "somewhat better;" occasionally taking tylenol/ ibuprofen- "helps some;" confirmed patient has made PCP office visit for evaluation of neck pain-- positive reinforcement provided  Diabetes Interventions: Assessed patient's understanding of A1c goal: <6.5%: patient has ongoing good understanding of A1-C significance/ most recent result Discussed plans with patient for ongoing care management follow up and provided patient with direct contact information for care management team Reviewed scheduled/upcoming provider appointments including: 01/27/21- PCP/ neck pain evaluation Review of patient status, including review of consultants reports, relevant laboratory and other test  results, and medications completed Reviewed recent blood sugars at home: reports consistent fasting blood sugars between 80-10; again denies recent low blood sugars- can tell when blood sugars start to fall- eats several small frequent meals throughout day, which he has found helpful in preventing hypoglycemia at home Again encouraged patient to schedule prompt eye exam- he has not had annual eye exam in several years and has cataracts that he believes need to be surgically removed-- he reports he plans to do after first of year Discussed with / provided education around self- dietary strategies for management of blood sugars: patient has very good understanding of same at baseline- positive reinforcement provided with encouragement to continue efforts Discussed/ provided education around benefits of exercise: he continues to report he is hopeful to re-join gym "soon"  Lab Results  Component Value Date   HGBA1C 6.6 (H) 11/21/2020  Patient Goals/Self-Care Activities: As evidenced by review of EHR, collaboration with care team, and patient reporting during CCM RN CM outreach,  Patient Samuel Krueger will: Continue taking medications as prescribed Attend all scheduled provider appointments Call pharmacy for medication refills Call provider office for new concerns or questions: I am glad you have made an appointment with Dr. Jenny Reichmann about the neck pain you are experieincing Continue to check fasting (first thing in the morning, before eating) blood sugars at home several times each week Continue to follow heart healthy, low salt, low cholesterol, carbohydrate-modified, low sugar diet Call the resources you have been provided for dental provider services that may be available  for you Call the [place] about  any possible benefits you may have Schedule an appointment to have your eyes examined  Follow Up Plan:   Telephone follow up appointment with care management team member scheduled for: Tuesday, February 23, 2021  at 11:30 am The patient has been provided with contact information for the care management team and has been advised to call with any health related questions or concerns    Plan: The patient has been provided with contact information for the care management team and has been advised to call with any health related questions or concerns  [name], RN, BSN, [place] [phone]: direct office

## 2021-01-27 ENCOUNTER — Ambulatory Visit (INDEPENDENT_AMBULATORY_CARE_PROVIDER_SITE_OTHER): Payer: HMO | Admitting: Internal Medicine

## 2021-01-27 ENCOUNTER — Encounter: Payer: Self-pay | Admitting: Internal Medicine

## 2021-01-27 ENCOUNTER — Other Ambulatory Visit: Payer: Self-pay

## 2021-01-27 DIAGNOSIS — E119 Type 2 diabetes mellitus without complications: Secondary | ICD-10-CM | POA: Diagnosis not present

## 2021-01-27 DIAGNOSIS — E559 Vitamin D deficiency, unspecified: Secondary | ICD-10-CM

## 2021-01-27 DIAGNOSIS — M542 Cervicalgia: Secondary | ICD-10-CM | POA: Insufficient documentation

## 2021-01-27 DIAGNOSIS — R030 Elevated blood-pressure reading, without diagnosis of hypertension: Secondary | ICD-10-CM | POA: Diagnosis not present

## 2021-01-27 MED ORDER — TRAMADOL HCL 50 MG PO TABS: 50.0000 mg | ORAL_TABLET | Freq: Four times a day (QID) | ORAL | 0 refills | Status: DC | PRN

## 2021-01-27 MED ORDER — TIZANIDINE HCL 2 MG PO TABS: 2.0000 mg | ORAL_TABLET | Freq: Four times a day (QID) | ORAL | 1 refills | Status: DC | PRN

## 2021-01-27 NOTE — Assessment & Plan Note (Signed)
Lab Results  Component Value Date   HGBA1C 6.8 (H) 12/25/2020   Stable, pt to continue current medical treatment glucotrol

## 2021-01-27 NOTE — Assessment & Plan Note (Signed)
BP Readings from Last 3 Encounters:  01/27/21 134/80  12/25/20 120/78  11/25/20 (!) 142/80   Stable, pt to continue medical treatment  - diet, wt control

## 2021-01-27 NOTE — Assessment & Plan Note (Signed)
After several days severe pain, now improved, cant r/o underlying DJD or DDD flare - for tramadol prn, and tizanidine prn,  to f/u any worsening symptoms or concerns

## 2021-01-27 NOTE — Patient Instructions (Signed)
Please take all new medication as prescribed - the pain medication and muscle relaxer as needed  Please continue all other medications as before, and refills have been done if requested.  Please have the pharmacy call with any other refills you may need.  Please continue your efforts at being more active, low cholesterol diet, and weight control  Please keep your appointments with your specialists as you may have planned

## 2021-01-27 NOTE — Progress Notes (Signed)
Patient ID: Samuel Krueger, male   DOB: 1945-08-14, 75 y.o.   MRN: [id number]        Chief Complaint: follow up post neck pain       HPI:  Samuel Krueger is a 75 y.o. male here with c/o 3 days onset severe post upper neck pain, somewhat worse to the right side, constant for several days, then improved today as he gets here, but no other HA, focal neuro s/s, or UE radicular symptoms.  Gait no change, no falls.  Pt denies chest pain, increased sob or doe, wheezing, orthopnea, PND, increased LE swelling, palpitations, dizziness or syncope.   Pt denies polydipsia, polyuria.   Pt denies fever, wt loss, night sweats, loss of appetite, or other constitutional symptoms         Wt Readings from Last 3 Encounters:  01/27/21 181 lb (82.1 kg)  12/25/20 179 lb (81.2 kg)  11/25/20 183 lb (83 kg)   BP Readings from Last 3 Encounters:  01/27/21 134/80  12/25/20 120/78  11/25/20 (!) 142/80         Past Medical History:  Diagnosis Date   Hyperlipidemia    Nocturia    Prostate cancer [facility]) urologist-- dr Janice Norrie   active survellance since dx Feb 2014--  now has Stage T1c, Gleason (3+4)7, PSA 7.75   Recovering alcoholic in remission ([place])    since rehab 20 yrs ago-- approx 1995   S/P radiation therapy 07/15/2014   Radioactive Seed Implant- Prostate/proximal seminal vesicles, 14,500 cGy utilizing 74 I-125 seeds implanted in 30 needles with an individual seed activity 0.658 mCi per seed for a total implant activity of 48.6.millicuries.   TIA (transient ischemic attack) 11/20/2020   Tinnitus of both ears    Type 2 diabetes, diet controlled ([place])    Past Surgical History:  Procedure Laterality Date   PROSTATE BIOPSY  03/26/14   RADIOACTIVE SEED IMPLANT N/A 07/15/2014   Procedure: RADIOACTIVE SEED IMPLANT/BRACHYTHERAPY IMPLANT;  Surgeon: Lowella Bandy, MD;  Location: [facility];  Service: Urology;  Laterality: N/A;    reports that he quit smoking about 20 years ago. His smoking use included  cigarettes. He has never used smokeless tobacco. He reports that he does not drink alcohol and does not use drugs. family history includes Alcohol abuse in his mother; HIV/AIDS in his brother; Heart disease in his mother; Hypertension in his mother; Mental illness in an other family member. Allergies  Allergen Reactions   Jardiance [Empagliflozin] Diarrhea   Metformin And Related Other (See Comments)    Chest tightness   Shrimp [Shellfish Allergy] Nausea Only and Other (See Comments)    "strange GI reaction"/stomach turns over- no breathing issues, however   Current Outpatient Medications on File Prior to Visit  Medication Sig Dispense Refill   ALPHA LIPOIC ACID PO Take 1 capsule by mouth daily.     Blood Glucose Monitoring Suppl (ONETOUCH VERIO) w/Device KIT Use as directed twice daily E11.9 1 kit 0   Cholecalciferol (VITAMIN D-3 PO) Take 1 capsule by mouth 2 (two) times a week.     glipiZIDE (GLUCOTROL XL) 2.5 MG 24 hr tablet TAKE 1 TABLET (2.5 MG TOTAL) BY MOUTH DAILY WITH BREAKFAST. 90 tablet 1   glucose blood (ONETOUCH VERIO) test strip USE AS DIRECTED TWICE A DAY 200 strip 12   Lancets MISC Use as directed twice daily E11.9 200 each 3   MAGNESIUM PO Take 1 tablet by mouth daily.     Multiple  Vitamin (MULTIVITAMIN) tablet Take 1 tablet by mouth 3 (three) times a week.     polyethylene glycol powder (GLYCOLAX/MIRALAX) 17 GM/SCOOP powder Take 17 g by mouth 2 (two) times daily as needed. 3350 g 1   Thiamine HCl (VITAMIN B-1 PO) Take 1 tablet by mouth daily.     vitamin C (ASCORBIC ACID) 500 MG tablet Take 500-1,000 mg by mouth daily.     atorvastatin (LIPITOR) 40 MG tablet Take 1 tablet (40 mg total) by mouth daily. 90 tablet 3   No current facility-administered medications on file prior to visit.        ROS:  All others reviewed and negative.  Objective        PE:  BP 134/80    Pulse 72    Temp 98.1 F (36.7 C) (Oral)    Ht _0  (1.803 m)    Wt 181 lb (82.1 kg)    SpO2 98%    BMI  25.24 kg/m                 Constitutional: Pt appears in NAD               HENT: Head: NCAT.                Right Ear: External ear normal.                 Left Ear: External ear normal.                Eyes: . Pupils are equal, round, and reactive to light. Conjunctivae and EOM are normal               Nose: without d/c or deformity               Neck: Neck supple. Gross normal ROM, mild tender bilateral upper c spine paravertebral without rash or swelling               Cardiovascular: Normal rate and regular rhythm.                 Pulmonary/Chest: Effort normal and breath sounds without rales or wheezing.                Abd:  Soft, NT, ND, + BS, no organomegaly               Neurological: Pt is alert. At baseline orientation, motor grossly intact               Skin: Skin is warm. No rashes, no other new lesions, LE edema - none               Psychiatric: Pt behavior is normal without agitation   Micro: none  Cardiac tracings I have personally interpreted today:  none  Pertinent Radiological findings (summarize): none   Lab Results  Component Value Date   WBC 5.0 12/25/2020   HGB 13.7 12/25/2020   HCT 42.0 12/25/2020   PLT 182.0 12/25/2020   GLUCOSE 98 12/25/2020   CHOL 119 12/25/2020   TRIG 63.0 12/25/2020   HDL 57.50 12/25/2020   LDLDIRECT 98.0 08/02/2019   LDLCALC 49 12/25/2020   ALT 14 12/25/2020   AST 26 12/25/2020   NA 140 12/25/2020   K 4.7 12/25/2020   CL 102 12/25/2020   CREATININE 1.08 12/25/2020   BUN 17 12/25/2020   CO2 28 12/25/2020   TSH 0.48 12/25/2020   PSA 0.02 (L) 12/25/2020  INR 0.9 11/20/2020   HGBA1C 6.8 (H) 12/25/2020   MICROALBUR 1.0 12/25/2020   Assessment/Plan:  Samuel Krueger is a 75 y.o. Black or African American [2] male with  has a past medical history of Hyperlipidemia, Nocturia, Prostate cancer ([place]) (urologist-- dr Janice Norrie), Recovering alcoholic in remission [facility]), S/P radiation therapy (07/15/2014), TIA (transient ischemic attack)  (11/20/2020), Tinnitus of both ears, and Type 2 diabetes, diet controlled ([place]).  Bilateral posterior neck pain After several days severe pain, now improved, cant r/o underlying DJD or DDD flare - for tramadol prn, and tizanidine prn,  to f/u any worsening symptoms or concerns  Elevated blood pressure reading without diagnosis of hypertension BP Readings from Last 3 Encounters:  01/27/21 134/80  12/25/20 120/78  11/25/20 (!) 142/80   Stable, pt to continue medical treatment  - diet, wt control   Type 2 diabetes, diet controlled ([place]) Lab Results  Component Value Date   HGBA1C 6.8 (H) 12/25/2020   Stable, pt to continue current medical treatment glucotrol   Vitamin D deficiency Last vitamin D Lab Results  Component Value Date   VD25OH 43.54 12/25/2020   Stable, cont oral replacement  Followup: Return if symptoms worsen or fail to improve.  Cathlean Cower, MD 01/27/2021 10:14 PM [facility]

## 2021-01-27 NOTE — Assessment & Plan Note (Signed)
Last vitamin D Lab Results  Component Value Date   VD25OH 43.54 12/25/2020   Stable, cont oral replacement  

## 2021-02-06 DIAGNOSIS — E119 Type 2 diabetes mellitus without complications: Secondary | ICD-10-CM | POA: Diagnosis not present

## 2021-02-06 DIAGNOSIS — E7800 Pure hypercholesterolemia, unspecified: Secondary | ICD-10-CM

## 2021-02-10 DIAGNOSIS — E663 Overweight: Secondary | ICD-10-CM | POA: Diagnosis not present

## 2021-02-10 DIAGNOSIS — G8929 Other chronic pain: Secondary | ICD-10-CM | POA: Diagnosis not present

## 2021-02-10 DIAGNOSIS — Z7902 Long term (current) use of antithrombotics/antiplatelets: Secondary | ICD-10-CM | POA: Diagnosis not present

## 2021-02-10 DIAGNOSIS — E1136 Type 2 diabetes mellitus with diabetic cataract: Secondary | ICD-10-CM | POA: Diagnosis not present

## 2021-02-10 DIAGNOSIS — I6990 Unspecified sequelae of unspecified cerebrovascular disease: Secondary | ICD-10-CM | POA: Diagnosis not present

## 2021-02-10 DIAGNOSIS — K5900 Constipation, unspecified: Secondary | ICD-10-CM | POA: Diagnosis not present

## 2021-02-10 DIAGNOSIS — I2510 Atherosclerotic heart disease of native coronary artery without angina pectoris: Secondary | ICD-10-CM | POA: Diagnosis not present

## 2021-02-10 DIAGNOSIS — E785 Hyperlipidemia, unspecified: Secondary | ICD-10-CM | POA: Diagnosis not present

## 2021-02-10 DIAGNOSIS — E1169 Type 2 diabetes mellitus with other specified complication: Secondary | ICD-10-CM | POA: Diagnosis not present

## 2021-02-10 DIAGNOSIS — H259 Unspecified age-related cataract: Secondary | ICD-10-CM | POA: Diagnosis not present

## 2021-02-18 ENCOUNTER — Ambulatory Visit: Payer: HMO | Admitting: Physician Assistant

## 2021-02-18 ENCOUNTER — Encounter: Payer: Self-pay | Admitting: Physician Assistant

## 2021-02-18 VITALS — BP 106/72 | HR 93

## 2021-02-18 DIAGNOSIS — Z1211 Encounter for screening for malignant neoplasm of colon: Secondary | ICD-10-CM | POA: Diagnosis not present

## 2021-02-18 DIAGNOSIS — K5900 Constipation, unspecified: Secondary | ICD-10-CM | POA: Diagnosis not present

## 2021-02-18 NOTE — Patient Instructions (Signed)
If you are age 77 or older, your body mass index should be between 23-30. Your There is no height or weight on file to calculate BMI. If this is out of the aforementioned range listed, please consider follow up with your Primary Care Provider. ________________________________________________________  The [facility] providers would like to encourage you to use [facility] to communicate with providers for non-urgent requests or questions.  Due to long hold times on the telephone, sending your provider a message by [facility] may be a faster and more efficient way to get a response.  Please allow 48 business hours for a response.  Please remember that this is for non-urgent requests.  _______________________________________________________  Your provider has ordered Cologuard testing as an option for colon cancer screening. This is performed by [employer] and may be out of network with your insurance. PRIOR to completing the test, it is YOUR responsibility to contact your insurance about covered benefits for this test. Your out of pocket expense could be anywhere from $0.00 to $649.00.   When you call to check coverage with your insurer, please provide the following information:   -The ONLY provider of Cologuard is [place] code for Cologuard is [phone].  [employer] NPI # [id number]  -[employer] Tax ID # [id number]   We have already sent your demographic and insurance information to [employer] (phone number [phone]) and they should contact you within the next week regarding your test. If you have not heard from them within the next week, please call our office at [phone].  Continue to use 2 doses of Miralax daily as needed.  You may do Miralax purge if needed for recurrent severe constipation.  Thank you for entrusting me with your care and choosing [facility].  Amy Esterwood, PA-C

## 2021-02-18 NOTE — Progress Notes (Signed)
Subjective:    Patient ID: Samuel Krueger, male    DOB: 1945-06-18, 76 y.o.   MRN: [id number]  HPI Samuel Krueger is a 76 year old African-American male, established with Samuel Krueger.  He comes in today for follow-up after being seen in the office on 12/25/2020 by myself. Patient had hospital admission in October 2022 with a CVA, and has been on aspirin and Plavix.  When he was seen in November he was having a lot of problems with constipation over the prior 2 months and felt that he was having narrower caliber stools and intermittent dark stools as well.  He had rectal exam in the office which was Hemoccult negative.  He had had a very remote colonoscopy in 2004 which was negative with exception of internal hemorrhoids.  At that time he was given a instructions for a bowel purge with MiraLAX and then asked to start taking MiraLAX 17 g in 8 ounces water twice daily.  We also discussed use of Senokot at bedtime increase water intake.  He was scheduled for CT of the abdomen pelvis which was done on 01/04/2021 and showed radiation seed implants, and a small umbilical hernia, no abnormality noted in the small bowel or colon remainder of exam was negative. He comes back in today stating that he has been doing much better.  He does not feel that he has had much residual weakness post CVA.  He says he never did the bowel purge nor has he been taking MiraLAX because he is not convinced that that works for him.  He started drinking a concoction of coffee and a lemon which she says worked great to help him evacuate his bowels.  He is not requiring the limit at this point but has been drinking coffee daily and says he is having 1-2 bowel movements per day.  He says his stools are formed he is not noting any narrow stools and has not noted any melena or hematochezia. Labs were done 12/23/2020 hemoglobin 13.7 hematocrit 42.  Review of Systems Pertinent positive and negative review of systems were noted in the above HPI  section.  All other review of systems was otherwise negative.   Outpatient Encounter Medications as of 02/18/2021  Medication Sig   atorvastatin (LIPITOR) 40 MG tablet Take 1 tablet (40 mg total) by mouth daily.   Blood Glucose Monitoring Suppl (ONETOUCH VERIO) w/Device KIT Use as directed twice daily E11.9   Cholecalciferol (VITAMIN D-3 PO) Take 1 capsule by mouth 2 (two) times a week.   glipiZIDE (GLUCOTROL XL) 2.5 MG 24 hr tablet TAKE 1 TABLET (2.5 MG TOTAL) BY MOUTH DAILY WITH BREAKFAST.   glucose blood (ONETOUCH VERIO) test strip USE AS DIRECTED TWICE A DAY   Lancets MISC Use as directed twice daily E11.9   tiZANidine (ZANAFLEX) 2 MG tablet Take 1 tablet (2 mg total) by mouth every 6 (six) hours as needed for muscle spasms.   traMADol (ULTRAM) 50 MG tablet Take 1 tablet (50 mg total) by mouth every 6 (six) hours as needed.   ALPHA LIPOIC ACID PO Take 1 capsule by mouth daily. (Patient not taking: Reported on 02/18/2021)   MAGNESIUM PO Take 1 tablet by mouth daily. (Patient not taking: Reported on 02/18/2021)   Multiple Vitamin (MULTIVITAMIN) tablet Take 1 tablet by mouth 3 (three) times a week. (Patient not taking: Reported on 02/18/2021)   polyethylene glycol powder (GLYCOLAX/MIRALAX) 17 GM/SCOOP powder Take 17 g by mouth 2 (two) times daily as needed. (Patient not  taking: Reported on 02/18/2021)   Thiamine HCl (VITAMIN B-1 PO) Take 1 tablet by mouth daily. (Patient not taking: Reported on 02/18/2021)   vitamin C (ASCORBIC ACID) 500 MG tablet Take 500-1,000 mg by mouth daily. (Patient not taking: Reported on 02/18/2021)   No facility-administered encounter medications on file as of 02/18/2021.   Allergies  Allergen Reactions   Jardiance [Empagliflozin] Diarrhea   Metformin And Related Other (See Comments)    Chest tightness   Shrimp [Shellfish Allergy] Nausea Only and Other (See Comments)    "strange GI reaction"/stomach turns over- no breathing issues, however   Patient Active Problem  List   Diagnosis Date Noted   Bilateral posterior neck pain 01/27/2021   CVA (cerebral vascular accident) ([place]) 12/25/2020   Slurred speech 11/20/2020   History of stroke 11/20/2020   Erectile dysfunction 07/23/2020   Carcinoma of prostate ([place]) 07/23/2020   Hypertrophy of prostate without urinary obstruction and other lower urinary tract symptoms (LUTS) 07/23/2020   Irregular heart beat 07/23/2020   Dark stools 07/23/2020   Constipation 07/25/2019   Left lumbar radiculopathy 07/25/2019   Vitamin D deficiency 01/14/2019   Hyperlipidemia 03/30/2016   Recovering alcoholic in remission ([place])    Prostate cancer ([place])    Type 2 diabetes, diet controlled ([place])    S/P radiation therapy 07/15/2014   Elevated blood pressure reading without diagnosis of hypertension 11/05/2011   Alcohol use 11/02/2011   Social History   Socioeconomic History   Marital status: Widowed    Spouse name: single   Number of children: 1   Years of education: 12   Highest education level: Not on file  Occupational History   Occupation: retired  Tobacco Use   Smoking status: Former    Years: 0.80    Types: Cigarettes    Quit date: 07/07/2000    Years since quitting: 20.6   Smokeless tobacco: Never  Vaping Use   Vaping Use: Never used  Substance and Sexual Activity   Alcohol use: No    Comment: Recovering alcoholic since 1610   Drug use: No   Sexual activity: Not on file  Other Topics Concern   Not on file  Social History Narrative   Not on file   Social Determinants of Health   Financial Resource Strain: Not on file  Food Insecurity: No Food Insecurity   Worried About Running Out of Food in the Last Year: Never true   [place] in the Last Year: Never true  Transportation Needs: No Transportation Needs   Lack of Transportation (Medical): No   Lack of Transportation (Non-Medical): No  Physical Activity: Not on file  Stress: Not on file  Social Connections: Not on file  Intimate Partner  Violence: Not on file    Samuel Krueger's family history includes Alcohol abuse in his mother; HIV/AIDS in his brother; Heart disease in his mother; Hypertension in his mother; Mental illness in an other family member.      Objective:    Vitals:   02/18/21 1112  BP: 106/72  Pulse: 93  SpO2: 98%    Physical Exam Well-developed well-nourished f elderly African-American male in no acute distress.  Height, Weight, 181   HEENT; nontraumatic normocephalic, EOMI, PE R LA, sclera anicteric. Oropharynx; not examined today Neck; supple, no JVD Cardiovascular; regular rate and rhythm with S1-S2, no murmur rub or gallop Pulmonary; Clear bilaterally Abdomen; soft, nontender, nondistended, no palpable mass or hepatosplenomegaly, bowel sounds are active Rectal; not done today  Skin; benign exam, no jaundice rash or appreciable lesions Extremities; no clubbing cyanosis or edema skin warm and dry Neuro/Psych; alert and oriented x4, grossly nonfocal mood and affect appropriate        Assessment & Krueger:   #17 76 year old African-American male who was seen a couple of months ago with significant constipation after he had had a CVA in October 2022.  There were also concerns about dark stools however he hemocculted negative and hemoglobin was 13.7. He comes in today for follow-up stating that he is doing much better is no longer having any issues with constipation, having 1-2 bowel movements per day by drinking coffee on a daily basis and had purged himself with a concoction of lemon and coffee. We had instructed him in a bowel purge with MiraLAX which he did not do, he has not been taking MiraLAX regularly because he does not feel it is effective  CT of the abdomen pelvis was done which did not show any abnormality of the small bowel or colon.  Does have a small umbilical hernia.  #2 chronic antiplatelet therapy-on Plavix and aspirin with CVA October 2022 3 adult onset diabetes mellitus 4.  Prostate  cancer status postradiation #5 prior history of EtOH abuse, in recovery  Krueger I do not think that patient needs to undergo colonoscopy at this time, with normalization of his bowel habits, heme-negative stools, normal hemoglobin, advanced age. He is interested in doing a Cologuard stool DNA test, we will proceed with ordering that. For now he will continue liberal water intake, and can continue as needed purge with drinking coffee and lemon. We rediscussed the MiraLAX purge should he become significantly constipated and he still has that at home.    Gwen Sarvis Genia Harold PA-C 02/18/2021   Cc: Biagio Borg, MD

## 2021-02-23 ENCOUNTER — Ambulatory Visit (INDEPENDENT_AMBULATORY_CARE_PROVIDER_SITE_OTHER): Payer: HMO | Admitting: *Deleted

## 2021-02-23 DIAGNOSIS — Z8673 Personal history of transient ischemic attack (TIA), and cerebral infarction without residual deficits: Secondary | ICD-10-CM

## 2021-02-23 DIAGNOSIS — E7800 Pure hypercholesterolemia, unspecified: Secondary | ICD-10-CM

## 2021-02-23 DIAGNOSIS — E119 Type 2 diabetes mellitus without complications: Secondary | ICD-10-CM

## 2021-02-23 NOTE — Patient Instructions (Signed)
Visit Information  Samuel Krueger, thank you for taking time to talk with me today. Please don't hesitate to contact me if I can be of assistance to you before our next scheduled telephone appointment.  Below are the goals we discussed today:  Patient Self-Care Activities: Patient Samuel Krueger will: Continue taking medications as prescribed Attend all scheduled provider appointments Call pharmacy for medication refills Call provider office for new concerns or questions Try to check fasting (first thing in the morning, before eating) blood sugars at home several times each week Continue to follow heart healthy, low salt, low cholesterol, carbohydrate-modified, low sugar diet Call the resources you have been provided for dental provider services that may be available  for you- the e-mail with resources was sent to you on January 19, 2021 Don't forget to schedule an appointment to have your eyes examined  Our next scheduled telephone follow up visit/ appointment with care management team member is scheduled on:  Tuesday, May 25, 2021 at 11:30 am- This is PHONE CALL appointment  If you need to cancel or re-schedule our visit, please call [phone] and our care guide team will be happy to assist you.   I look forward to hearing about your progress.   [name], RN, BSN, [place] [phone]: direct office  If you are experiencing a Mental Health or [place] or need someone to talk to, please  call the Suicide and Crisis Lifeline: 988 call the [place] National Suicide Prevention Lifeline: 607-122-1004 or TTY: 818-031-8284 TTY (343)353-5590) to talk to a trained counselor call 1-800-273-TALK (toll free, 24 hour hotline) go to [facility] [address] [place] [phone]) call 911   Patient verbalizes understanding of instructions and care plan provided today and agrees to  view in [place]. Active MyChart status confirmed with patient  Diabetes Mellitus and Standards of [place] with and managing diabetes (diabetes mellitus) can be complicated. Your diabetes treatment may be managed by a team of health care providers, including: A physician who specializes in diabetes (endocrinologist). You might also have visits with a nurse practitioner or physician assistant. Nurses. A registered dietitian. A certified diabetes care and education specialist. An exercise specialist. A pharmacist. An eye doctor. A foot specialist (podiatrist). A dental care provider. A primary care provider. A mental health care provider. How to manage your diabetes You can do many things to successfully manage your diabetes. Your health care providers will follow guidelines to help you get the best quality of care. Here are general guidelines for your diabetes management plan. Your health care providers may give you more specific instructions. Physical exams When you are diagnosed with diabetes, and each year after that, your health care provider will ask about your medical and family history. You will have a physical exam, which may include: Measuring your height, weight, and body mass index (BMI). Checking your blood pressure. This will be done at every routine medical visit. Your target blood pressure may vary depending on your medical conditions, your age, and other factors. A thyroid exam. A skin exam. Screening for nerve damage (peripheral neuropathy). This may include checking the pulse in your legs and feet and the level of sensation in your hands and feet. A foot exam to inspect the structure and skin of your feet, including checking for cuts, bruises, redness, blisters, sores, or other problems. Screening for blood vessel (vascular) problems. This may include checking the pulse  in your legs and feet and checking your temperature. Blood tests Depending on your treatment  plan and your personal needs, you may have the following tests: Hemoglobin A1C (HbA1C). This test provides information about blood sugar (glucose) control over the previous 2-3 months. It is used to adjust your treatment plan, if needed. This test will be done: At least 2 times a year, if you are meeting your treatment goals. 4 times a year, if you are not meeting your treatment goals or if your goals have changed. Lipid testing, including total cholesterol, LDL and HDL cholesterol, and triglyceride levels. The goal for LDL is less than 100 mg/dL (5.5 mmol/L). If you are at high risk for complications, the goal is less than 70 mg/dL (3.9 mmol/L). The goal for HDL is 40 mg/dL (2.2 mmol/L) or higher for men, and 50 mg/dL (2.8 mmol/L) or higher for women. An HDL cholesterol of 60 mg/dL (3.3 mmol/L) or higher gives some protection against heart disease. The goal for triglycerides is less than 150 mg/dL (8.3 mmol/L). Liver function tests. Kidney function tests. Thyroid function tests.  Dental and eye exams  Visit your dentist two times a year. If you have type 1 diabetes, your health care provider may recommend an eye exam within 5 years after you are diagnosed, and then once a year after your first exam. For children with type 1 diabetes, the health care provider may recommend an eye exam when your child is age 14 or older and has had diabetes for 3-5 years. After the first exam, your child should get an eye exam once a year. If you have type 2 diabetes, your health care provider may recommend an eye exam as soon as you are diagnosed, and then every 1-2 years after your first exam. Immunizations A yearly flu (influenza) vaccine is recommended annually for everyone 6 months or older. This is especially important if you have diabetes. The pneumonia (pneumococcal) vaccine is recommended for everyone 2 years or older who has diabetes. If you are age 23 or older, you may get the pneumonia vaccine as a  series of two separate shots. The hepatitis B vaccine is recommended for adults shortly after being diagnosed with diabetes. Adults and children with diabetes should receive all other vaccines according to age-specific recommendations from the Centers for Disease Control and Prevention (CDC). Mental and emotional health Screening for symptoms of eating disorders, anxiety, and depression is recommended at the time of diagnosis and after as needed. If your screening shows that you have symptoms, you may need more evaluation. You may work with a mental health care provider. Follow these instructions at home: Treatment plan You will monitor your blood glucose levels and may give yourself insulin. Your treatment plan will be reviewed at every medical visit. You and your health care provider will discuss: How you are taking your medicines, including insulin. Any side effects you have. Your blood glucose level target goals. How often you monitor your blood glucose level. Lifestyle habits, such as activity level and tobacco, alcohol, and substance use. Education Your health care provider will assess how well you are monitoring your blood glucose levels and whether you are taking your insulin and medicines correctly. He or she may refer you to: A certified diabetes care and education specialist to manage your diabetes throughout your life, starting at diagnosis. A registered dietitian who can create and review your personal nutrition plan. An exercise specialist who can discuss your activity level and exercise plan. General instructions  Take over-the-counter and prescription medicines only as told by your health care provider. Keep all follow-up visits. This is important. Where to find support There are many diabetes support networks, including: American Diabetes Association (ADA): diabetes.org Defeat Diabetes Foundation: defeatdiabetes.org Where to find more information American Diabetes  Association (ADA): www.diabetes.org Association of Diabetes Care & Education Specialists (ADCES): diabeteseducator.org International Diabetes Federation (IDF): https://www.munoz-bell.org/ Summary Managing diabetes (diabetes mellitus) can be complicated. Your diabetes treatment may be managed by a team of health care providers. Your health care providers follow guidelines to help you get the best quality care. You should have physical exams, blood tests, blood pressure monitoring, immunizations, and screening tests regularly. Stay updated on how to manage your diabetes. Your health care providers may also give you more specific instructions based on your individual health. This information is not intended to replace advice given to you by your health care provider. Make sure you discuss any questions you have with your health care provider. Document Revised: 08/01/2019 Document Reviewed: 08/01/2019 Elsevier Patient Education  [place].

## 2021-02-23 NOTE — Chronic Care Management (AMB) (Signed)
Chronic Care Management   CCM RN Visit Note  02/23/2021 Name: Samuel Krueger MRN: [id number] DOB: 05-31-1945  Subjective: Samuel Krueger is a 76 y.o. year old male who is a primary care patient of Biagio Borg, MD. The care management team was consulted for assistance with disease management and care coordination needs.    Engaged with patient by telephone for follow up visit in response to provider referral for case management and/or care coordination services.   Consent to Services:  The patient was given information about Chronic Care Management services, agreed to services, and gave verbal consent prior to initiation of services.  Please see initial visit note for detailed documentation.  Patient agreed to services and verbal consent obtained.   Assessment: Review of patient past medical history, allergies, medications, health status, including review of consultants reports, laboratory and other test data, was performed as part of comprehensive evaluation and provision of chronic care management services.   SDOH (Social Determinants of Health) assessments and interventions performed:  SDOH Interventions    Flowsheet Row Most Recent Value  SDOH Interventions   Food Insecurity Interventions Intervention Not Indicated  [Denies food insecurity]  Housing Interventions Intervention Not Indicated  [Contiues living in single family home with roommates]  Transportation Interventions Intervention Not Indicated  [Patient reports continues driving self,  working on vehicle, states expects to have fixed soon,  denies transportation needs]      CCM Care Plan Allergies  Allergen Reactions   Jardiance [Empagliflozin] Diarrhea   Metformin And Related Other (See Comments)    Chest tightness   Shrimp [Shellfish Allergy] Nausea Only and Other (See Comments)    "strange GI reaction"/stomach turns over- no breathing issues, however   Outpatient Encounter Medications as of 02/23/2021   Medication Sig   clopidogrel (PLAVIX) 75 MG tablet Take 75 mg by mouth daily.   ALPHA LIPOIC ACID PO Take 1 capsule by mouth daily. (Patient not taking: Reported on 02/18/2021)   atorvastatin (LIPITOR) 40 MG tablet Take 1 tablet (40 mg total) by mouth daily.   Blood Glucose Monitoring Suppl (ONETOUCH VERIO) w/Device KIT Use as directed twice daily E11.9   Cholecalciferol (VITAMIN D-3 PO) Take 1 capsule by mouth 2 (two) times a week.   glipiZIDE (GLUCOTROL XL) 2.5 MG 24 hr tablet TAKE 1 TABLET (2.5 MG TOTAL) BY MOUTH DAILY WITH BREAKFAST.   glucose blood (ONETOUCH VERIO) test strip USE AS DIRECTED TWICE A DAY   Lancets MISC Use as directed twice daily E11.9   MAGNESIUM PO Take 1 tablet by mouth daily. (Patient not taking: Reported on 02/18/2021)   Multiple Vitamin (MULTIVITAMIN) tablet Take 1 tablet by mouth 3 (three) times a week. (Patient not taking: Reported on 02/18/2021)   polyethylene glycol powder (GLYCOLAX/MIRALAX) 17 GM/SCOOP powder Take 17 g by mouth 2 (two) times daily as needed. (Patient not taking: Reported on 02/18/2021)   Thiamine HCl (VITAMIN B-1 PO) Take 1 tablet by mouth daily. (Patient not taking: Reported on 02/18/2021)   tiZANidine (ZANAFLEX) 2 MG tablet Take 1 tablet (2 mg total) by mouth every 6 (six) hours as needed for muscle spasms.   traMADol (ULTRAM) 50 MG tablet Take 1 tablet (50 mg total) by mouth every 6 (six) hours as needed.   vitamin C (ASCORBIC ACID) 500 MG tablet Take 500-1,000 mg by mouth daily. (Patient not taking: Reported on 02/18/2021)   No facility-administered encounter medications on file as of 02/23/2021.   Patient Active Problem List   Diagnosis  Date Noted   Bilateral posterior neck pain 01/27/2021   CVA (cerebral vascular accident) ([place]) 12/25/2020   Slurred speech 11/20/2020   History of stroke 11/20/2020   Erectile dysfunction 07/23/2020   Carcinoma of prostate ([place]) 07/23/2020   Hypertrophy of prostate without urinary obstruction and other lower  urinary tract symptoms (LUTS) 07/23/2020   Irregular heart beat 07/23/2020   Dark stools 07/23/2020   Constipation 07/25/2019   Left lumbar radiculopathy 07/25/2019   Vitamin D deficiency 01/14/2019   Hyperlipidemia 03/30/2016   Recovering alcoholic in remission [facility])    Prostate cancer ([place])    Type 2 diabetes, diet controlled ([place])    S/P radiation therapy 07/15/2014   Elevated blood pressure reading without diagnosis of hypertension 11/05/2011   Alcohol use 11/02/2011   Conditions to be addressed/monitored:  HLD and DMII  Care Plan : RN Care Manager Plan of Care  Updates made by [name], RN since 02/23/2021 12:00 AM     Problem: Chronic Disease Management Needs   Priority: High     Long-Range Goal: Development of plan of care for long term chronic disease management   Start Date: 12/22/2020  Expected End Date: 12/22/2021  Priority: High  Note:   Current Barriers:  Chronic Disease Management support and education needs related to HLD and DMII Recent hospitalization October 14-16, 2022 for TIA/ CVA- no deficits/ residual identified Poor dentition: 12/22/20- community resource care guide referral placed-- 01/22/21- patient confirms he has been provided with dental resources; verbalizes plans to follow up on resources after holidays are over; 02/23/21: patient has not utilized dental resources provided to him 01/19/21 by [employer] care guide-- was encouraged to follow up  [place]):  Patient will demonstrate Ongoing health management independence DMII; HLD  through collaboration with RN Care manager, provider, and care team.   Interventions: 1:1 collaboration with primary care provider regarding development and update of comprehensive plan of care as evidenced by provider attestation and co-signature Inter-disciplinary care team collaboration (see longitudinal plan of care) Evaluation of current treatment plan related to  self management and patient's  adherence to plan as established by provider Initial assessment completed 01/22/21 02/23/21: Falls assessment updated: continues to report no falls x 12 months- has assistive devices for cane/ walker; does not routinely use; previously provided education around fall risks/ prevention reinforced; positive reinforcement provided with encouragement to continue efforts to prevent falls 02/23/21: Pain assessment updated: patient reports complete resolution of previously reported neck pain; denies ongoing pain of acute/ chronic nature 02/23/21: SDOH updated; no new concerns/ unmet needs identified today  Hyperlipidemia Interventions:  02/23/21: GOAL status: ON TRACK; Long Term goal Reviewed role and benefits of statin for ASCVD risk reduction Reviewed importance of limiting foods high in cholesterol Confirmed no medication concerns-- confirms continues taking Atorvastatin and clopidogrel, however, noted thorough review of EHR that clopidogrel is no longer listed on medication list: can find no evidence of discontinuation of this medication; confirmed patient continues taking; re- added to medication list according to patient report, will message PCP as FYI Reviewed recent PCP office visit 01/27/22: patient verbalizes good understanding of post-visit instructions; denies questions around same Reviewed recent GI provider office visit: reports he has followed instructions to remedy constipation, states he is no longer constipated and reports "medicines are working good" Discussed previously cancelled appointment with neurology provider 12/29/21- patient states he is not sure he needs to go to this appointment; we discussed general/ potential causes of CVA, and I encouraged  patient to consider re-scheduling this missed appointment: he confirms he has the phone number for neurology practice, states will consider re-scheduling, still not sure this visit is necessary  Diabetes Interventions: 02/23/21- Goal Status: On  Track; Long-Term Goal Discussed plans with patient for ongoing care management follow up and provided patient with direct contact information for care management team Reviewed scheduled/upcoming provider appointments including: no upcoming provider appointments noted; patient was reminded to schedule annual eye/ vision provider appointment Review of patient status, including review of consultants reports, relevant laboratory and other test results, and medications completed Attempted to review recent blood sugars at home: reports he has not been consistently monitoring at home; "took a break, don't like pricking fingers, and my A1-C is under control;" encouraged patient to resume blood sugar monitoring at home several times each week for his own knowledge/ empowerment; he will consider Discussed with / provided education around self- dietary strategies for management of blood sugars: patient has very good understanding of same at baseline- positive reinforcement provided with encouragement to continue efforts Discussed/ provided education around benefits of exercise: he continues to report he is hopeful to re-join gym "soon;" confirms has not yet done, due to need for car repair, which he reports he is "working on"  Lab Results  Component Value Date   HGBA1C 6.8 (H) 12/25/2020  Patient Goals/Self-Care Activities: As evidenced by review of EHR, collaboration with care team, and patient reporting during CCM RN CM outreach,  Patient Ante will: Continue taking medications as prescribed Attend all scheduled provider appointments Call pharmacy for medication refills Call provider office for new concerns or questions Try to check fasting (first thing in the morning, before eating) blood sugars at home several times each week Continue to follow heart healthy, low salt, low cholesterol, carbohydrate-modified, low sugar diet Call the resources you have been provided for dental provider services that may  be available  for you- the e-mail with resources was sent to you on January 19, 2021 Don't forget to schedule an appointment to have your eyes examined  Follow Up Plan:   Telephone follow up appointment with care management team member scheduled for: Tuesday, May 25, 2021 at 11:30 am The patient has been provided with contact information for the care management team and has been advised to call with any health related questions or concerns     Plan: The patient has been provided with contact information for the care management team and has been advised to call with any health related questions or concerns  [name], RN, BSN, [place] [phone]: direct office

## 2021-03-09 DIAGNOSIS — E119 Type 2 diabetes mellitus without complications: Secondary | ICD-10-CM

## 2021-03-09 DIAGNOSIS — E7800 Pure hypercholesterolemia, unspecified: Secondary | ICD-10-CM | POA: Diagnosis not present

## 2021-03-30 DIAGNOSIS — Z1211 Encounter for screening for malignant neoplasm of colon: Secondary | ICD-10-CM | POA: Diagnosis not present

## 2021-04-07 LAB — COLOGUARD: COLOGUARD: POSITIVE — AB

## 2021-04-08 ENCOUNTER — Telehealth: Payer: Self-pay

## 2021-04-08 NOTE — Telephone Encounter (Signed)
Discussed with the patient. He will expect a call 04/14/21 at 1:00 pm from Pre-visit nurse to review the prep. He agrees to the procedure date of 04/26/21. ?

## 2021-04-08 NOTE — Telephone Encounter (Signed)
Spoke with the patient about the positive Cologuard and the recommendation for having a colonoscopy. Patient agrees to this. He reports he is no longer taking Plavix. He last took it several months ago. Specifically, he took one month of Plavix after he was discharged from the hospital. He has not seen a neurologist.  ?I will schedule his colonoscopy in the [place] as instructed. ?

## 2021-04-09 ENCOUNTER — Telehealth: Payer: Self-pay | Admitting: *Deleted

## 2021-04-09 NOTE — Telephone Encounter (Signed)
John, please review this airway note from 2016 surgery and advise if [facility] appropriate or not- Pt of Dr Fuller Plan-  PV 3-8, [place] colon 3-20  ? ?Difficulty Due To: Difficulty was unanticipated and Difficult Airway- due to reduced neck mobility ?Comments: "floppy epiglottis" Laryngoscopy X 2 [place] one with bougie. Dr Kalman Shan laryngoscopy X 2 intubated with bougie ? ? ? ?Thanks  Lelan Pons PV  ?

## 2021-04-09 NOTE — Telephone Encounter (Signed)
Sheri, ? ?This pt was scheduled for a colon with Fuller Plan  3-20- he had an OV 02-18-21 with Amy and he did a cologuard- it came back positive and he was scheduled for a colon ? ?He has a documented difficult intubation and per Jenny Reichmann will need a hospital case-  Since he just had an OV can he be booked directly at [facility] ? ? ?Please advise  ? ?Thanks Lelan Pons  ?

## 2021-04-12 ENCOUNTER — Other Ambulatory Visit: Payer: Self-pay

## 2021-04-12 ENCOUNTER — Telehealth: Payer: Self-pay

## 2021-04-12 DIAGNOSIS — Z1211 Encounter for screening for malignant neoplasm of colon: Secondary | ICD-10-CM

## 2021-04-12 DIAGNOSIS — R195 Other fecal abnormalities: Secondary | ICD-10-CM

## 2021-04-12 DIAGNOSIS — K5900 Constipation, unspecified: Secondary | ICD-10-CM

## 2021-04-12 NOTE — Telephone Encounter (Signed)
Per anesthesiologist, patient is a difficult intubation and will need to have colon with Dr. Fuller Plan at hospital. Patient's procedure for 04/26/21 has been canceled, and new orders have been placed. Patient has been rescheduled for 06/22/21 at 7:30 am. Left message for patient to call back.  ?

## 2021-04-12 NOTE — Telephone Encounter (Signed)
Thanks Benjamine Mola, I will get his colon arrange to be at [facility].  ?

## 2021-04-13 NOTE — Telephone Encounter (Signed)
PV rescheduled to 06/07/21 at 1:00 pm.  ?

## 2021-04-13 NOTE — Telephone Encounter (Signed)
Left message for patient to call back. Unable to reach daughter as well, line rings busy. Patient sent message via [place]. ?

## 2021-04-13 NOTE — Telephone Encounter (Signed)
Please RS pt's PV to the end of April as well- thanks so much  Lelan Pons PV  ?

## 2021-04-13 NOTE — Telephone Encounter (Signed)
Patient returned call, and is aware of why the procedure has been rescheduled to hospital & new time/dates.  ?

## 2021-04-26 ENCOUNTER — Encounter: Payer: HMO | Admitting: Gastroenterology

## 2021-05-18 ENCOUNTER — Telehealth: Payer: Self-pay

## 2021-05-18 NOTE — Telephone Encounter (Signed)
Pt is calling requesting a Rx for a [employer]. Pt was advised by insurance company that it would be covered with Rx. ? ?Pharmacy: ?[id number] [address]? ?LOV 01/27/21 ? ?

## 2021-05-19 MED ORDER — FREESTYLE LIBRE 2 READER DEVI: 0 refills | Status: DC

## 2021-05-19 MED ORDER — FREESTYLE LIBRE 2 SENSOR MISC: 3 refills | Status: DC

## 2021-05-19 NOTE — Telephone Encounter (Signed)
Prescription sent to pharmacy and patient notified.  

## 2021-05-25 ENCOUNTER — Ambulatory Visit (INDEPENDENT_AMBULATORY_CARE_PROVIDER_SITE_OTHER): Payer: HMO | Admitting: *Deleted

## 2021-05-25 DIAGNOSIS — H04123 Dry eye syndrome of bilateral lacrimal glands: Secondary | ICD-10-CM | POA: Diagnosis not present

## 2021-05-25 DIAGNOSIS — E7800 Pure hypercholesterolemia, unspecified: Secondary | ICD-10-CM

## 2021-05-25 DIAGNOSIS — E119 Type 2 diabetes mellitus without complications: Secondary | ICD-10-CM

## 2021-05-25 DIAGNOSIS — H40013 Open angle with borderline findings, low risk, bilateral: Secondary | ICD-10-CM | POA: Diagnosis not present

## 2021-05-25 DIAGNOSIS — H25812 Combined forms of age-related cataract, left eye: Secondary | ICD-10-CM | POA: Diagnosis not present

## 2021-05-25 NOTE — Chronic Care Management (AMB) (Signed)
?Chronic Care Management  ? ?CCM RN Visit Note ? ?05/25/2021 ?Name: Samuel Krueger MRN: [id number] DOB: 20-May-1945 ? ?Subjective: ?Samuel Krueger is a 76 y.o. year old male who is a primary care patient of Biagio Borg, MD. The care management team was consulted for assistance with disease management and care coordination needs.   ? ?Engaged with patient by telephone for follow up visit in response to provider referral for case management and/or care coordination services.  ? ?Consent to Services:  ?The patient was given information about Chronic Care Management services, agreed to services, and gave verbal consent prior to initiation of services.  Please see initial visit note for detailed documentation.  ?Patient agreed to services and verbal consent obtained.  ? ?Assessment: Review of patient past medical history, allergies, medications, health status, including review of consultants reports, laboratory and other test data, was performed as part of comprehensive evaluation and provision of chronic care management services.  ? ?SDOH (Social Determinants of Health) assessments and interventions performed:  ?SDOH Interventions   ? ?Flowsheet Row Most Recent Value  ?SDOH Interventions   ?Food Insecurity Interventions Intervention Not Indicated  [Continues to deny food insecurity]  ?Housing Interventions Intervention Not Indicated  [continues living in single family home with roommates,  denies safety concerns with housing]  ?Transportation Interventions Intervention Not Indicated  [reports car still not working- family currently assisting with transportation]  ? ?  ?CCM Care Plan ? ?Allergies  ?Allergen Reactions  ? Jardiance [Empagliflozin] Diarrhea  ? Metformin And Related Other (See Comments)  ?  Chest tightness  ? Shrimp [Shellfish Allergy] Nausea Only and Other (See Comments)  ?  "strange GI reaction"/stomach turns over- no breathing issues, however  ? ?Outpatient Encounter Medications as of 05/25/2021   ?Medication Sig  ? ALPHA LIPOIC ACID PO Take 1 capsule by mouth daily. (Patient not taking: Reported on 02/18/2021)  ? atorvastatin (LIPITOR) 40 MG tablet Take 1 tablet (40 mg total) by mouth daily.  ? Blood Glucose Monitoring Suppl (ONETOUCH VERIO) w/Device KIT Use as directed twice daily E11.9  ? Cholecalciferol (VITAMIN D-3 PO) Take 1 capsule by mouth 2 (two) times a week.  ? Continuous Blood Gluc Receiver (FREESTYLE LIBRE 2 READER) DEVI Use to check blood sugar twice daily E11.9  ? Continuous Blood Gluc Sensor (FREESTYLE LIBRE 2 SENSOR) MISC Use to check blood sugar twice daily E11.9  ? glipiZIDE (GLUCOTROL XL) 2.5 MG 24 hr tablet TAKE 1 TABLET (2.5 MG TOTAL) BY MOUTH DAILY WITH BREAKFAST.  ? glucose blood (ONETOUCH VERIO) test strip USE AS DIRECTED TWICE A DAY  ? Lancets MISC Use as directed twice daily E11.9  ? MAGNESIUM PO Take 1 tablet by mouth daily. (Patient not taking: Reported on 02/18/2021)  ? Multiple Vitamin (MULTIVITAMIN) tablet Take 1 tablet by mouth 3 (three) times a week. (Patient not taking: Reported on 02/18/2021)  ? polyethylene glycol powder (GLYCOLAX/MIRALAX) 17 GM/SCOOP powder Take 17 g by mouth 2 (two) times daily as needed. (Patient not taking: Reported on 02/18/2021)  ? Thiamine HCl (VITAMIN B-1 PO) Take 1 tablet by mouth daily. (Patient not taking: Reported on 02/18/2021)  ? tiZANidine (ZANAFLEX) 2 MG tablet Take 1 tablet (2 mg total) by mouth every 6 (six) hours as needed for muscle spasms.  ? traMADol (ULTRAM) 50 MG tablet Take 1 tablet (50 mg total) by mouth every 6 (six) hours as needed.  ? vitamin C (ASCORBIC ACID) 500 MG tablet Take 500-1,000 mg by mouth daily. (Patient not  taking: Reported on 02/18/2021)  ? ?No facility-administered encounter medications on file as of 05/25/2021.  ? ?Patient Active Problem List  ? Diagnosis Date Noted  ? Bilateral posterior neck pain 01/27/2021  ? CVA (cerebral vascular accident) ([place]) 12/25/2020  ? Slurred speech 11/20/2020  ? History of stroke  11/20/2020  ? Erectile dysfunction 07/23/2020  ? Carcinoma of prostate ([place]) 07/23/2020  ? Hypertrophy of prostate without urinary obstruction and other lower urinary tract symptoms (LUTS) 07/23/2020  ? Irregular heart beat 07/23/2020  ? Dark stools 07/23/2020  ? Constipation 07/25/2019  ? Left lumbar radiculopathy 07/25/2019  ? Vitamin D deficiency 01/14/2019  ? Hyperlipidemia 03/30/2016  ? Recovering alcoholic in remission [facility])   ? Prostate cancer ([place])   ? Type 2 diabetes, diet controlled ([place])   ? S/P radiation therapy 07/15/2014  ? Elevated blood pressure reading without diagnosis of hypertension 11/05/2011  ? Alcohol use 11/02/2011  ? ?Conditions to be addressed/monitored:  HLD and DMII ? ?Care Plan : RN Care Manager Plan of Care  ?Updates made by [name], RN since 05/25/2021 12:00 AM  ?  ? ?Problem: Chronic Disease Management Needs   ?Priority: High  ?  ? ?Long-Range Goal: Ongoing adherence to established plan of care for long term chronic disease management   ?Start Date: 12/22/2020  ?Expected End Date: 12/22/2021  ?Priority: High  ?Note:   ?Current Barriers:  ?Chronic Disease Management support and education needs related to HLD and DMII ?Recent hospitalization October 14-16, 2022 for TIA/ CVA- no deficits/ residual identified ?Poor dentition: 12/22/20- community resource care guide referral placed-- 01/22/21- patient confirms he has been provided with dental resources; verbalizes plans to follow up on resources after holidays are over; 02/23/21: patient has not utilized dental resources provided to him 01/19/21 by [employer] care guide-- was encouraged to follow up ? ?RNCM Clinical Goal(s):  ?Patient will demonstrate Ongoing health management independence DMII; HLD  through collaboration with RN Care manager, provider, and care team.  ? ?Interventions: ?1:1 collaboration with primary care provider regarding development and update of comprehensive plan of care as evidenced by provider  attestation and co-signature ?Inter-disciplinary care team collaboration (see longitudinal plan of care) ?Evaluation of current treatment plan related to  self management and patient's adherence to plan as established by provider ?Initial assessment completed 01/22/21 ?Review of patient status, including review of consultants reports, relevant laboratory and other test results, and medications completed ?SDOH updated: no new/ unmet concerns identified ?Depression screening updated: no concerns identified ?Pain assessment updated: denies pain ?Falls assessment updated: continues to deny new/ recent falls x 12 months- currently not using assistive devices;  positive reinforcement provided with encouragement to continue efforts at fall prevention; previously provided education around fall risks/ prevention reinforced ?Medications discussed:  reports continues to independently self-manage and denies current concerns/ issues/ questions around medications; endorses adherence to taking all medications as prescribed ?Reviewed upcoming scheduled provider appointments: 05/25/21- annual vision/ eye exam; 06/22/21- colonoscopy; patient confirms is aware of all and has plans to attend as scheduled ?Reminded patient that it is time for follow up PCP office visit (last visit for routine care 11/25/21): he will schedule after he gets his colonoscopy completed ?Confirmed patient is planning to attend colonoscopy: he denies question about upcoming procedure ?Discussed plans with patient for ongoing care management follow up and provided patient with direct contact information for care management team    ? ?Hyperlipidemia Interventions:  05/25/21: GOAL status: ON TRACK; Long Term goal ?Counseled on importance  of regular laboratory monitoring as prescribed ?Reviewed role and benefits of statin for ASCVD risk reduction ?Reviewed importance of limiting foods high in cholesterol ?Confirmed no medication concerns: he reported continues taking  statin as prescribed; confirms he is NOT currently taking ACT ? ?Diabetes Interventions: 05/25/21- Goal Status: On Track; Long-Term Goal ?Provided education to patient about basic DM disease process ?Counseled on importanc

## 2021-05-25 NOTE — Patient Instructions (Signed)
Visit Information ? ?Samuel Krueger, thank you for taking time to talk with me today. Please don't hesitate to contact me if I can be of assistance to you before our next scheduled telephone appointment ? ?Below are the goals we discussed today:  ?Patient Self-Care Activities: ?Patient Samuel Krueger will: ?It is time for you to schedule an appointment with Dr. Jenny Reichmann- please do this after you have had your colonoscopy ?Continue taking medications as prescribed ?Attend all scheduled provider appointments ?Call pharmacy for medication refills ?Call provider office for new concerns or questions ?Check fasting (first thing in the morning, before eating) blood sugars at home every day using your new Free-Style Libre ?Continue to follow heart healthy, low salt, low cholesterol, carbohydrate-modified, low sugar diet ?Keep up the great work preventing falls ? ?Our next scheduled telephone follow up visit/ appointment is scheduled on: Monday, August 30, 2021 11:30 am- This is a PHONE CALL appointment ? ?If you need to cancel or re-schedule our visit, please call [phone] and our care guide team will be happy to assist you. ?  ?I look forward to hearing about your progress. ?  ?[name], RN, BSN, CCRN Alumnus ?[place] ?([phone]: direct office ? ?If you are experiencing a Mental Health or [place] or need someone to talk to, please  ?call the Suicide and Crisis Lifeline: 988 ?call the [place] National Suicide Prevention Lifeline: (825)218-1471 or TTY: (505)347-2481 TTY 562-607-6260) to talk to a trained counselor ?call 1-800-273-TALK (toll free, 24 hour hotline) ?go to [facility] [address] [place] [phone]) ?call 911  ? ?Patient verbalizes understanding of instructions and care plan provided today and agrees to view in [place]. Active MyChart status confirmed with patient ? ?Colonoscopy, Adult ?A colonoscopy is a  procedure to look at the entire large intestine. This procedure is done using a long, thin, flexible tube that has a camera on the end. ?You may have a colonoscopy: ?As a part of normal colorectal screening. ?If you have certain symptoms, such as: ?A low number of red blood cells in your blood (anemia). ?Diarrhea that does not go away. ?Pain in your abdomen. ?Blood in your stool. ?A colonoscopy can help screen for and diagnose medical problems, including: ?An abnormal growth of cells or tissue (tumor). ?Abnormal growths within the lining of your intestine (polyps). ?Inflammation. ?Areas of bleeding. ?Tell your health care provider about: ?Any allergies you have. ?All medicines you are taking, including vitamins, herbs, eye drops, creams, and over-the-counter medicines. ?Any problems you or family members have had with anesthetic medicines. ?Any bleeding problems you have. ?Any surgeries you have had. ?Any medical conditions you have. ?Any problems you have had with having bowel movements. ?Whether you are pregnant or may be pregnant. ?What are the risks? ?Generally, this is a safe procedure. However, problems may occur, including: ?Bleeding. ?Damage to your intestine. ?Allergic reactions to medicines given during the procedure. ?Infection. This is rare. ?What happens before the procedure? ?Eating and drinking restrictions ?Follow instructions from your health care provider about eating or drinking restrictions, which may include: ?A few days before the procedure: ?Follow a low-fiber diet. ?Avoid nuts, seeds, dried fruit, raw fruits, and vegetables. ?1-3 days before the procedure: ?Eat only gelatin dessert or ice pops. ?Drink only clear liquids, such as water, clear juice, clear broth or bouillon, black coffee or tea, or clear soft drinks or sports drinks. ?Avoid liquids that contain red or purple dye. ?The  day of the procedure: ?Do not eat solid foods. You may continue to drink clear liquids until up to 2 hours  before the procedure. ?Do not eat or drink anything starting 2 hours before the procedure, or within the time period that your health care provider recommends. ?Bowel prep ?If you were prescribed a bowel prep to take by mouth (orally) to clean out your colon: ?Take it as told by your health care provider. Starting the day before your procedure, you will need to drink a large amount of liquid medicine. The liquid will cause you to have many bowel movements of loose stool until your stool becomes almost clear or light green. ?If your skin or the opening between the buttocks (anus) gets irritated from diarrhea, you may relieve the irritation using: ?Wipes with medicine in them, such as adult wet wipes with aloe and vitamin E. ?A product to soothe skin, such as petroleum jelly. ?If you vomit while drinking the bowel prep: ?Take a break for up to 60 minutes. ?Begin the bowel prep again. ?Call your health care provider if you keep vomiting or you cannot take the bowel prep without vomiting. ?To clean out your colon, you may also be given: ?Laxative medicines. These help you have a bowel movement. ?Instructions for enema use. An enema is liquid medicine injected into your rectum. ?Medicines ?Ask your health care provider about: ?Changing or stopping your regular medicines or supplements. This is especially important if you are taking iron supplements, diabetes medicines, or blood thinners. ?Taking medicines such as aspirin and ibuprofen. These medicines can thin your blood. Do not take these medicines unless your health care provider tells you to take them. ?Taking over-the-counter medicines, vitamins, herbs, and supplements. ?General instructions ?Ask your health care provider what steps will be taken to help prevent infection. These may include washing skin with a germ-killing soap. ?If you will be going home right after the procedure, plan to have a responsible adult: ?Take you home from the hospital or clinic. You will  not be allowed to drive. ?Care for you for the time you are told. ?What happens during the procedure? ? ?An IV will be inserted into one of your veins. ?You will be given a medicine to make you fall asleep (general anesthetic). ?You will lie on your side with your knees bent. ?A lubricant will be put on the tube. Then the tube will be: ?Inserted into your anus. ?Gently eased through all parts of your large intestine. ?Air will be sent into your colon to keep it open. This may cause some pressure or cramping. ?Images will be taken with the camera and will appear on a screen. ?A small tissue sample may be removed to be looked at under a microscope (biopsy). The tissue may be sent to a lab for testing if any signs of problems are found. ?If small polyps are found, they may be removed and checked for cancer cells. ?When the procedure is finished, the tube will be removed. ?The procedure may vary among health care providers and hospitals. ?What happens after the procedure? ?Your blood pressure, heart rate, breathing rate, and blood oxygen level will be monitored until you leave the hospital or clinic. ?You may have a small amount of blood in your stool. ?You may pass gas and have mild cramping or bloating in your abdomen. This is caused by the air that was used to open your colon during the exam. ?If you were given a sedative during the procedure, it can  affect you for several hours. Do not drive or operate machinery until your health care provider says that it is safe. ?It is up to you to get the results of your procedure. Ask your health care provider, or the department that is doing the procedure, when your results will be ready. ?Summary ?A colonoscopy is a procedure to look at the entire large intestine. ?Follow instructions from your health care provider about eating and drinking before the procedure. ?If you were prescribed an oral bowel prep to clean out your colon, take it as told by your health care  provider. ?During the colonoscopy, a flexible tube with a camera on its end is inserted into the anus and then passed into all parts of the large intestine. ?This information is not intended to replace advice given to

## 2021-06-06 DIAGNOSIS — E7800 Pure hypercholesterolemia, unspecified: Secondary | ICD-10-CM

## 2021-06-06 DIAGNOSIS — E119 Type 2 diabetes mellitus without complications: Secondary | ICD-10-CM | POA: Diagnosis not present

## 2021-06-07 ENCOUNTER — Ambulatory Visit (AMBULATORY_SURGERY_CENTER): Payer: PPO | Admitting: *Deleted

## 2021-06-07 VITALS — Ht 71.0 in | Wt 181.0 lb

## 2021-06-07 DIAGNOSIS — Z1211 Encounter for screening for malignant neoplasm of colon: Secondary | ICD-10-CM

## 2021-06-07 MED ORDER — PEG 3350-KCL-NA BICARB-NACL 420 G PO SOLR: 4000.0000 mL | Freq: Once | ORAL | 0 refills | Status: AC

## 2021-06-07 NOTE — Progress Notes (Signed)
Patient's pre-visit was done today over the phone with the patient. Name,DOB and address verified. Patient denies any allergies to Eggs and Soy. Patient denies any problems with anesthesia/sedation. Patient is a difficult intubation per chart. Patient is not taking any diet pills or blood thinners. No home Oxygen. Insurance confirmed with patient. ? ?Prep instructions sent to pt's MyChart (if available) & mailed to pt-pt is aware. Patient understands to call us back with any questions or concerns. Patient is aware of our care-partner policy. Patient denies any constipation at this time. ? ?EMMI education assigned to the patient for the procedure, sent to [facility].  ? ?The patient is COVID-19 vaccinated.   ?

## 2021-06-15 ENCOUNTER — Encounter (HOSPITAL_COMMUNITY): Payer: Self-pay | Admitting: Gastroenterology

## 2021-06-15 NOTE — Progress Notes (Signed)
Attempted to obtain medical history via telephone, unable to reach at this time. Unable to leave voicemail to return pre surgical testing department's phone call,due to mailbox full.  

## 2021-06-22 ENCOUNTER — Ambulatory Visit (HOSPITAL_COMMUNITY)
Admission: RE | Admit: 2021-06-22 | Discharge: 2021-06-22 | Disposition: A | Discharge status: Home / Self Care | Payer: HMO | Attending: Gastroenterology | Admitting: Gastroenterology

## 2021-06-22 ENCOUNTER — Encounter (HOSPITAL_COMMUNITY): Payer: Self-pay | Admitting: Gastroenterology

## 2021-06-22 ENCOUNTER — Ambulatory Visit (HOSPITAL_BASED_OUTPATIENT_CLINIC_OR_DEPARTMENT_OTHER): Payer: HMO | Admitting: Anesthesiology

## 2021-06-22 ENCOUNTER — Ambulatory Visit (HOSPITAL_COMMUNITY): Payer: HMO | Admitting: Anesthesiology

## 2021-06-22 ENCOUNTER — Other Ambulatory Visit: Payer: Self-pay

## 2021-06-22 ENCOUNTER — Encounter (HOSPITAL_COMMUNITY)
Admission: RE | Disposition: A | Discharge status: Home / Self Care | Payer: Self-pay | Source: Home / Self Care | Attending: Gastroenterology

## 2021-06-22 DIAGNOSIS — Z8673 Personal history of transient ischemic attack (TIA), and cerebral infarction without residual deficits: Secondary | ICD-10-CM | POA: Diagnosis not present

## 2021-06-22 DIAGNOSIS — E119 Type 2 diabetes mellitus without complications: Secondary | ICD-10-CM | POA: Insufficient documentation

## 2021-06-22 DIAGNOSIS — Z7984 Long term (current) use of oral hypoglycemic drugs: Secondary | ICD-10-CM

## 2021-06-22 DIAGNOSIS — Z87891 Personal history of nicotine dependence: Secondary | ICD-10-CM | POA: Diagnosis not present

## 2021-06-22 DIAGNOSIS — R195 Other fecal abnormalities: Secondary | ICD-10-CM | POA: Insufficient documentation

## 2021-06-22 DIAGNOSIS — Z1211 Encounter for screening for malignant neoplasm of colon: Secondary | ICD-10-CM | POA: Diagnosis not present

## 2021-06-22 DIAGNOSIS — K5900 Constipation, unspecified: Secondary | ICD-10-CM

## 2021-06-22 DIAGNOSIS — K635 Polyp of colon: Secondary | ICD-10-CM | POA: Diagnosis not present

## 2021-06-22 DIAGNOSIS — D122 Benign neoplasm of ascending colon: Secondary | ICD-10-CM | POA: Diagnosis not present

## 2021-06-22 HISTORY — PX: POLYPECTOMY: SHX5525

## 2021-06-22 HISTORY — PX: COLONOSCOPY WITH PROPOFOL: SHX5780

## 2021-06-22 LAB — GLUCOSE, CAPILLARY: Glucose-Capillary: 88 mg/dL (ref 70–99)

## 2021-06-22 SURGERY — COLONOSCOPY WITH PROPOFOL
Anesthesia: Monitor Anesthesia Care

## 2021-06-22 MED ORDER — PROPOFOL 500 MG/50ML IV EMUL
INTRAVENOUS | Status: AC
  Filled 2021-06-22: qty 50

## 2021-06-22 MED ORDER — PROPOFOL 10 MG/ML IV BOLUS
INTRAVENOUS | Status: DC | PRN
  Administered 2021-06-22: 50 mg via INTRAVENOUS

## 2021-06-22 MED ORDER — LACTATED RINGERS IV SOLN
INTRAVENOUS | Status: DC
  Administered 2021-06-22: 1000 mL via INTRAVENOUS

## 2021-06-22 MED ORDER — PROPOFOL 500 MG/50ML IV EMUL
INTRAVENOUS | Status: DC | PRN
  Administered 2021-06-22: 125 ug/kg/min via INTRAVENOUS

## 2021-06-22 MED ORDER — SODIUM CHLORIDE 0.9 % IV SOLN: INTRAVENOUS | Status: DC

## 2021-06-22 SURGICAL SUPPLY — 22 items

## 2021-06-22 NOTE — Anesthesia Postprocedure Evaluation (Signed)
Anesthesia Post Note ? ?Patient: Samuel Krueger ? ?Procedure(s) Performed: COLONOSCOPY WITH PROPOFOL ?POLYPECTOMY ? ?  ? ?Patient location during evaluation: PACU ?Anesthesia Type: MAC ?Level of consciousness: awake and alert ?Pain management: pain level controlled ?Vital Signs Assessment: post-procedure vital signs reviewed and stable ?Respiratory status: spontaneous breathing, nonlabored ventilation, respiratory function stable and patient connected to nasal cannula oxygen ?Cardiovascular status: stable and blood pressure returned to baseline ?Postop Assessment: no apparent nausea or vomiting ?Anesthetic complications: no ? ? ?No notable events documented. ? ?Last Vitals:  ?Vitals:  ? 06/22/21 0830 06/22/21 0840  ?BP: (!) 101/48 134/84  ?Pulse: (!) 57 68  ?Resp: 15 15  ?Temp:    ?SpO2: 96% 98%  ?  ?Last Pain:  ?Vitals:  ? 06/22/21 0840  ?TempSrc:   ?PainSc: 0-No pain  ? ? ?  ?  ?  ?  ?  ?  ? ?Effie Berkshire ? ? ? ? ?

## 2021-06-22 NOTE — Op Note (Signed)
[facility] ?Patient Name: Samuel Krueger ?Procedure Date: 06/22/2021 ?MRN: [id number] ?Attending MD: Ladene Artist , MD ?Date of Birth: [birth year] ?CSN: [id number] ?Age: 76 ?Admit Type: Outpatient ?Procedure:                Colonoscopy ?Indications:              Positive Cologuard test ?Providers:                Pricilla Riffle. Fuller Plan, MD, Jeanella Cara, RN,  ?                          Benetta Spar, Technician ?Referring MD:             Biagio Borg, MD ?Medicines:                Monitored Anesthesia Care ?Complications:            No immediate complications. Estimated blood loss:  ?                          None. ?Estimated Blood Loss:     Estimated blood loss: none. ?Procedure:                Pre-Anesthesia Assessment: ?                          - Prior to the procedure, a History and Physical  ?                          was performed, and patient medications and  ?                          allergies were reviewed. The patient's tolerance of  ?                          previous anesthesia was also reviewed. The risks  ?                          and benefits of the procedure and the sedation  ?                          options and risks were discussed with the patient.  ?                          All questions were answered, and informed consent  ?                          was obtained. Prior Anticoagulants: The patient has  ?                          taken no previous anticoagulant or antiplatelet  ?                          agents. ASA Grade Assessment: III - A patient with  ?  severe systemic disease. After reviewing the risks  ?                          and benefits, the patient was deemed in  ?                          satisfactory condition to undergo the procedure. ?                          After obtaining informed consent, the colonoscope  ?                          was passed under direct vision. Throughout the  ?                          procedure, the  patient's blood pressure, pulse, and  ?                          oxygen saturations were monitored continuously. The  ?                          [id number]-[id number] ([id number]) Olympus colonoscope was  ?                          introduced through the anus and advanced to the the  ?                          cecum, identified by appendiceal orifice and  ?                          ileocecal valve. The ileocecal valve, appendiceal  ?                          orifice, and rectum were photographed. The quality  ?                          of the bowel preparation was adequate. The  ?                          colonoscopy was performed without difficulty. The  ?                          patient tolerated the procedure well. ?Scope In: 7:50:41 AM ?Scope Out: 8:14:41 AM ?Scope Withdrawal Time: 0 hours 19 minutes 24 seconds  ?Total Procedure Duration: 0 hours 24 minutes 0 seconds  ?Findings: ?     The perianal and digital rectal examinations were normal. ?     A 8 mm polyp was found in the proximal ascending colon. The polyp was  ?     sessile. The polyp was removed with a cold snare. Resection and  ?     retrieval were complete. ?     A 12 mm polyp was found in the mid ascending colon. The polyp was  ?     sessile. The polyp was removed with a hot snare. Resection and retrieval  ?  were complete. ?     The exam was otherwise without abnormality on direct and retroflexion  ?     views. ?Impression:               - One 8 mm polyp in the proximal ascending colon,  ?                          removed with a cold snare. Resected and retrieved. ?                          - One 12 mm polyp in the mid ascending colon,  ?                          removed with a hot snare. Resected and retrieved. ?                          - The examination was otherwise normal on direct  ?                          and retroflexion views. ?Moderate Sedation: ?     Not Applicable - Patient had care per Anesthesia. ?Recommendation:           - Consider repeat  colonoscopy vs no repeat due to  ?                          age after studies are complete for surveillance  ?                          based on pathology results. ?                          - Patient has a contact number available for  ?                          emergencies. The signs and symptoms of potential  ?                          delayed complications were discussed with the  ?                          patient. Return to normal activities tomorrow.  ?                          Written discharge instructions were provided to the  ?                          patient. ?                          - Resume previous diet. ?                          - Continue present medications. ?                          -  Await pathology results. ?                          - No aspirin, ibuprofen, naproxen, or other  ?                          non-steroidal anti-inflammatory drugs for 2 weeks  ?                          after polyp removal. ?Procedure Code(s):        --- Professional --- ?                          [phone], Colonoscopy, flexible; with removal of  ?                          tumor(s), polyp(s), or other lesion(s) by snare  ?                          technique ?Diagnosis Code(s):        --- Professional --- ?                          K63.5, Polyp of colon ?                          R19.5, Other fecal abnormalities ?CPT copyright 2019 American Medical Association. All rights reserved. ?The codes documented in this report are preliminary and upon coder review may  ?be revised to meet current compliance requirements. ?Ladene Artist, MD ?06/22/2021 8:20:10 AM ?This report has been signed electronically. ?Number of Addenda: 0 ?

## 2021-06-22 NOTE — Transfer of Care (Signed)
Immediate Anesthesia Transfer of Care Note ? ?Patient: Samuel Krueger ? ?Procedure(s) Performed: COLONOSCOPY WITH PROPOFOL ?POLYPECTOMY ? ?Patient Location: PACU and Endoscopy Unit ? ?Anesthesia Type:MAC ? ?Level of Consciousness: awake and drowsy ? ?Airway & Oxygen Therapy: Patient Spontanous Breathing and Patient connected to face mask oxygen ? ?Post-op Assessment: Report given to RN and Post -op Vital signs reviewed and stable ? ?Post vital signs: Reviewed and stable ? ?Last Vitals:  ?Vitals Value Taken Time  ?BP    ?Temp    ?Pulse 66 06/22/21 0822  ?Resp 20 06/22/21 0822  ?SpO2 98 % 06/22/21 0822  ?Vitals shown include unvalidated device data. ? ?Last Pain:  ?Vitals:  ? 06/22/21 0638  ?TempSrc: Tympanic  ?   ? ?  ? ?Complications: No notable events documented. ?

## 2021-06-22 NOTE — Discharge Instructions (Signed)

## 2021-06-22 NOTE — H&P (Signed)
? ?History & Physical ? ?Primary Care Physician:  Biagio Borg, MD ?Primary Gastroenterologist: Lucio Edward, MD ? ?CHIEF COMPLAINT:  Positive Cologuard  ? ?HPI: Samuel Krueger is a 76 y.o. male here for colonoscopy for a positive Cologuard.   ? ? ?Past Medical History:  ?Diagnosis Date  ? Difficult airway for intubation   ? Hyperlipidemia   ? Nocturia   ? Prostate cancer [facility]) urologist-- dr Janice Norrie  ? active survellance since dx Feb 2014--  now has Stage T1c, Gleason (3+4)7, PSA 7.75  ? Recovering alcoholic in remission [facility])   ? since rehab 20 yrs ago-- approx 1995  ? S/P radiation therapy 07/15/2014  ? Radioactive Seed Implant- Prostate/proximal seminal vesicles, 14,500 cGy utilizing 74 I-125 seeds implanted in 30 needles with an individual seed activity 0.658 mCi per seed for a total implant activity of 48.6.millicuries.  ? TIA (transient ischemic attack) 11/20/2020  ? Tinnitus of both ears   ? Type 2 diabetes, diet controlled ([place])   ? ? ?Past Surgical History:  ?Procedure Laterality Date  ? COLONOSCOPY  2004  ? Dr.Stark  ? PROSTATE BIOPSY  03/26/2014  ? RADIOACTIVE SEED IMPLANT N/A 07/15/2014  ? Procedure: RADIOACTIVE SEED IMPLANT/BRACHYTHERAPY IMPLANT;  Surgeon: Lowella Bandy, MD;  Location: [facility];  Service: Urology;  Laterality: N/A;  ? ? ?Prior to Admission medications   ?Medication Sig Start Date End Date Taking? Authorizing Provider  ?atorvastatin (LIPITOR) 40 MG tablet Take 1 tablet (40 mg total) by mouth daily. 12/22/20 02/07/22 Yes Biagio Borg, MD  ?Blood Glucose Monitoring Suppl [facility] VERIO) w/Device KIT Use as directed twice daily E11.9 08/06/19  Yes Biagio Borg, MD  ?Capsicum, Cayenne, (CAYENNE PEPPER PO) Take by mouth.   Yes [provider]  ?Cholecalciferol (VITAMIN D-3 PO) Take 1 capsule by mouth 2 (two) times a week.   Yes [provider]  ?Continuous Blood Gluc Receiver (FREESTYLE LIBRE 2 READER) DEVI Use to check blood sugar twice daily E11.9 05/19/21   Yes Biagio Borg, MD  ?Continuous Blood Gluc Sensor (FREESTYLE LIBRE 2 SENSOR) MISC Use to check blood sugar twice daily E11.9 05/19/21  Yes Biagio Borg, MD  ?glipiZIDE (GLUCOTROL XL) 2.5 MG 24 hr tablet TAKE 1 TABLET (2.5 MG TOTAL) BY MOUTH DAILY WITH BREAKFAST. 08/14/20  Yes Biagio Borg, MD  ?glucose blood [facility] VERIO) test strip USE AS DIRECTED TWICE A DAY 08/13/20  Yes Biagio Borg, MD  ?Lancets MISC Use as directed twice daily E11.9 08/06/19  Yes Biagio Borg, MD  ?Omega-3 Fatty Acids (FISH OIL PO) Take by mouth.   Yes [provider]  ? ? ?Current Facility-Administered Medications  ?Medication Dose Route Frequency Provider Last Rate Last Admin  ? 0.9 %  sodium chloride infusion   Intravenous Continuous Ladene Artist, MD      ? lactated ringers infusion   Intravenous Continuous Ladene Artist, MD 10 mL/hr at 06/22/21 0650 1,000 mL at 06/22/21 0650  ? ? ?Allergies as of 04/12/2021 - Review Complete 02/23/2021  ?Allergen Reaction Noted  ? Jardiance [empagliflozin] Diarrhea 11/20/2020  ? Metformin and related Other (See Comments) 11/02/2011  ? Shrimp [shellfish allergy] Nausea Only and Other (See Comments) 07/08/2014  ? ? ?Family History  ?Problem Relation Age of Onset  ? Alcohol abuse Mother   ? Heart disease Mother   ? Hypertension Mother   ? HIV/AIDS Brother   ? Mental illness Other   ? Colon cancer Neg  Hx   ? Esophageal cancer Neg Hx   ? Pancreatic cancer Neg Hx   ? Stomach cancer Neg Hx   ? ? ?Social History  ? ?Socioeconomic History  ? Marital status: Widowed  ?  Spouse name: single  ? Number of children: 1  ? Years of education: 37  ? Highest education level: Not on file  ?Occupational History  ? Occupation: retired  ?Tobacco Use  ? Smoking status: Former  ?  Years: 0.80  ?  Types: Cigarettes  ?  Quit date: 07/07/2000  ?  Years since quitting: 20.9  ? Smokeless tobacco: Never  ?Vaping Use  ? Vaping Use: Never used  ?Substance and Sexual Activity  ? Alcohol use: No  ?  Comment: Recovering  alcoholic since 6010  ? Drug use: No  ? Sexual activity: Not on file  ?Other Topics Concern  ? Not on file  ?Social History Narrative  ? Not on file  ? ?Social Determinants of Health  ? ?Financial Resource Strain: Not on file  ?Food Insecurity: No Food Insecurity  ? Worried About Charity fundraiser in the Last Year: Never true  ? Ran Out of Food in the Last Year: Never true  ?Transportation Needs: No Transportation Needs  ? Lack of Transportation (Medical): No  ? Lack of Transportation (Non-Medical): No  ?Physical Activity: Not on file  ?Stress: Not on file  ?Social Connections: Not on file  ?Intimate Partner Violence: Not on file  ? ? ?Review of Systems: ? ?All systems reviewed an negative except where noted in HPI. ? ?Gen: Denies any fever, chills, sweats, anorexia, fatigue, weakness, malaise, weight loss, and sleep disorder ?CV: Denies chest pain, angina, palpitations, syncope, orthopnea, PND, peripheral edema, and claudication. ?Resp: Denies dyspnea at rest, dyspnea with exercise, cough, sputum, wheezing, coughing up blood, and pleurisy. ?GI: Denies vomiting blood, jaundice, and fecal incontinence.   Denies dysphagia or odynophagia. ?GU : Denies urinary burning, blood in urine, urinary frequency, urinary hesitancy, nocturnal urination, and urinary incontinence. ?MS: Denies joint pain, limitation of movement, and swelling, stiffness, low back pain, extremity pain. Denies muscle weakness, cramps, atrophy.  ?Derm: Denies rash, itching, dry skin, hives, moles, warts, or unhealing ulcers.  ?Psych: Denies depression, anxiety, memory loss, suicidal ideation, hallucinations, paranoia, and confusion. ?Heme: Denies bruising, bleeding, and enlarged lymph nodes. ?Neuro:  Denies any headaches, dizziness, paresthesias. ?Endo:  Denies any problems with DM, thyroid, adrenal function. ? ? ?Physical Exam: ?Vital signs in last 24 hours: ?Temp:  [98.5 ?F (36.9 ?C)] 98.5 ?F (36.9 ?C) (05/16 9323) ?Pulse Rate:  [75] 75 (05/16  5573) ?Resp:  [22] 22 (05/16 2202) ?BP: (146)/(91) 146/91 (05/16 5427) ?SpO2:  [97 %] 97 % (05/16 0623) ?Weight:  [82.1 kg] 82.1 kg (05/16 7628) ?  ?General:  Alert, well-developed, in NAD ?Head:  Normocephalic and atraumatic. ?Eyes:  Sclera clear, no icterus.   Conjunctiva pink. ?Ears:  Normal auditory acuity. ?Mouth:  No deformity or lesions.  ?Neck:  Supple; no masses . ?Lungs:  Clear throughout to auscultation.   No wheezes, crackles, or rhonchi. No acute distress. ?Heart:  Regular rate and rhythm; no murmurs. ?Abdomen:  Soft, nondistended, nontender. No masses, hepatomegaly. No obvious masses.  Normal bowel .    ?Rectal:  Deferred   ?Msk:  Symmetrical without gross deformities.Marland Kitchen ?Pulses:  Normal pulses noted. ?Extremities:  Without edema. ?Neurologic:  Alert and  oriented x4;  grossly normal neurologically. ?Skin:  Intact without significant lesions or rashes. ?Cervical Nodes:  No significant cervical adenopathy. ?Psych:  Alert and cooperative. Normal mood and affect. ? ? ?Impression / Plan:  ? ?Positive Cologuard for colonoscopy.  ? ?Kashlyn Salinas T. Fuller Plan  06/22/2021, 7:43 AM ?See Shea Evans, [facility], to contact our on call provider ? ? ?  ?

## 2021-06-22 NOTE — Anesthesia Preprocedure Evaluation (Addendum)
Anesthesia Evaluation  ?Patient identified by MRN, date of birth, ID band ?Patient awake ? ? ? ?Reviewed: ?Allergy & Precautions, NPO status , Patient's Chart, lab work & pertinent test results ? ?Airway ?Mallampati: I ? ?TM Distance: >3 FB ?Neck ROM: Full ? ? ? Dental ? ?(+) Teeth Intact, Dental Advisory Given ?  ?Pulmonary ?former smoker,  ?  ?breath sounds clear to auscultation ? ? ? ? ? ? Cardiovascular ?negative cardio ROS ? ? ?Rhythm:Regular Rate:Normal ? ? ?  ?Neuro/Psych ?TIACVA   ? GI/Hepatic ?negative GI ROS,   ?Endo/Other  ?diabetes, Type 2, Oral Hypoglycemic Agents ? Renal/GU ?  ? ?  ?Musculoskeletal ?negative musculoskeletal ROS ?(+)  ? Abdominal ?Normal abdominal exam  (+)   ?Peds ? Hematology ?  ?Anesthesia Other Findings ? ? Reproductive/Obstetrics ? ?  ? ? ? ? ? ? ? ? ? ? ? ? ? ?  ?  ? ? ? ? ? ? ? ?Anesthesia Physical ?Anesthesia Plan ? ?ASA: 2 ? ?Anesthesia Plan: MAC  ? ?Post-op Pain Management:   ? ?Induction: Intravenous ? ?PONV Risk Score and Plan: 2 and Propofol infusion ? ?Airway Management Planned: Natural Airway and Simple Face Mask ? ?Additional Equipment: None ? ?Intra-op Plan:  ? ?Post-operative Plan:  ? ?Informed Consent: I have reviewed the patients History and Physical, chart, labs and discussed the procedure including the risks, benefits and alternatives for the proposed anesthesia with the patient or authorized representative who has indicated his/her understanding and acceptance.  ? ? ? ? ? ?Plan Discussed with: CRNA ? ?Anesthesia Plan Comments:   ? ? ? ? ? ?Anesthesia Quick Evaluation ? ?

## 2021-06-23 ENCOUNTER — Encounter (HOSPITAL_COMMUNITY): Payer: Self-pay | Admitting: Gastroenterology

## 2021-06-23 ENCOUNTER — Encounter: Payer: Self-pay | Admitting: Gastroenterology

## 2021-06-23 LAB — SURGICAL PATHOLOGY

## 2021-07-08 ENCOUNTER — Other Ambulatory Visit: Payer: Self-pay | Admitting: Internal Medicine

## 2021-07-16 DIAGNOSIS — H25812 Combined forms of age-related cataract, left eye: Secondary | ICD-10-CM | POA: Diagnosis not present

## 2021-07-30 DIAGNOSIS — H25811 Combined forms of age-related cataract, right eye: Secondary | ICD-10-CM | POA: Diagnosis not present

## 2021-08-03 ENCOUNTER — Other Ambulatory Visit: Payer: Self-pay | Admitting: Internal Medicine

## 2021-08-16 ENCOUNTER — Other Ambulatory Visit: Payer: Self-pay | Admitting: Internal Medicine

## 2021-08-16 NOTE — Telephone Encounter (Signed)
Please refill as per office routine med refill policy (all routine meds to be refilled for 3 mo or monthly (per pt preference) up to one year from last visit, then month to month grace period for 3 mo, then further med refills will have to be denied) ? ?

## 2021-08-17 ENCOUNTER — Other Ambulatory Visit: Payer: Self-pay

## 2021-08-17 MED ORDER — GLIPIZIDE ER 2.5 MG PO TB24: 2.5000 mg | ORAL_TABLET | Freq: Every day | ORAL | 1 refills | Status: DC

## 2021-08-18 ENCOUNTER — Ambulatory Visit (INDEPENDENT_AMBULATORY_CARE_PROVIDER_SITE_OTHER): Payer: HMO | Admitting: *Deleted

## 2021-08-18 DIAGNOSIS — E119 Type 2 diabetes mellitus without complications: Secondary | ICD-10-CM

## 2021-08-18 DIAGNOSIS — E7800 Pure hypercholesterolemia, unspecified: Secondary | ICD-10-CM

## 2021-08-18 NOTE — Chronic Care Management (AMB) (Signed)
Chronic Care Management   CCM RN Visit Note  08/18/2021 Name: Samuel Krueger MRN: [id number] DOB: [birth year]  Subjective: Samuel Krueger is a 76 y.o. year old male who is a primary care patient of Samuel Borg, MD. The care management team was consulted for assistance with disease management and care coordination needs.    Engaged with patient by telephone for follow up visit/ CCM RN CM case closure in response to provider referral for case management and/or care coordination services.   Consent to Services:  The patient was given information about Chronic Care Management services, agreed to services, and gave verbal consent prior to initiation of services.  Please see initial visit note for detailed documentation.  Patient agreed to services and verbal consent obtained.   Assessment: Review of patient past medical history, allergies, medications, health status, including review of consultants reports, laboratory and other test data, was performed as part of comprehensive evaluation and provision of chronic care management services.   SDOH (Social Determinants of Health) assessments and interventions performed:  SDOH Interventions    Flowsheet Row Most Recent Value  SDOH Interventions   Food Insecurity Interventions Intervention Not Indicated  [continues to deny food insecurity]  Transportation Interventions Intervention Not Indicated  [reports drives self and family assists as/ if indicated]     CCM Care Plan  Allergies  Allergen Reactions   Jardiance [Empagliflozin] Diarrhea   Metformin And Related Other (See Comments)    Chest tightness   Shrimp [Shellfish Allergy] Nausea Only and Other (See Comments)    "strange GI reaction"/stomach turns over- no breathing issues, however   Outpatient Encounter Medications as of 08/18/2021  Medication Sig   atorvastatin (LIPITOR) 40 MG tablet Take 1 tablet (40 mg total) by mouth daily.   Blood Glucose Monitoring Suppl (ONETOUCH VERIO)  w/Device KIT Use as directed twice daily E11.9   Capsicum, Cayenne, (CAYENNE PEPPER PO) Take by mouth.   Cholecalciferol (VITAMIN D-3 PO) Take 1 capsule by mouth 2 (two) times a week.   Continuous Blood Gluc Receiver (FREESTYLE LIBRE 2 READER) DEVI USE TO CHECK BLOOD SUGAR TWICE DAILY   Continuous Blood Gluc Sensor (FREESTYLE LIBRE 2 SENSOR) MISC Use to check blood sugar twice daily E11.9   glipiZIDE (GLUCOTROL XL) 2.5 MG 24 hr tablet Take 1 tablet (2.5 mg total) by mouth daily with breakfast.   glucose blood (ONETOUCH VERIO) test strip USE AS DIRECTED TWICE A DAY   Lancets MISC Use as directed twice daily E11.9   Omega-3 Fatty Acids (FISH OIL PO) Take by mouth.   No facility-administered encounter medications on file as of 08/18/2021.   Patient Active Problem List   Diagnosis Date Noted   Benign neoplasm of ascending colon    Bilateral posterior neck pain 01/27/2021   CVA (cerebral vascular accident) ([place]) 12/25/2020   Slurred speech 11/20/2020   History of stroke 11/20/2020   Erectile dysfunction 07/23/2020   Carcinoma of prostate ([place]) 07/23/2020   Hypertrophy of prostate without urinary obstruction and other lower urinary tract symptoms (LUTS) 07/23/2020   Irregular heart beat 07/23/2020   Dark stools 07/23/2020   Constipation 07/25/2019   Left lumbar radiculopathy 07/25/2019   Vitamin D deficiency 01/14/2019   Hyperlipidemia 03/30/2016   Recovering alcoholic in remission [facility])    Prostate cancer ([place])    Type 2 diabetes, diet controlled ([place])    S/P radiation therapy 07/15/2014   Elevated blood pressure reading without diagnosis of hypertension 11/05/2011   Alcohol use 11/02/2011  Conditions to be addressed/monitored:  HLD and DMII  Care Plan : RN Care Manager Plan of Care  Updates made by Samuel Royalty, RN since 08/18/2021 12:00 AM     Problem: Chronic Disease Management Needs   Priority: High     Long-Range Goal: Ongoing adherence to established plan of care for  long term chronic disease management   Start Date: 12/22/2020  Expected End Date: 12/22/2021  Priority: High  Note:   Current Barriers:  Chronic Disease Management support and education needs related to HLD and DMII Recent hospitalization October 14-16, 2022 for TIA/ CVA- no deficits/ residual identified Poor dentition: 12/22/20- community resource care guide referral placed-- 01/22/21- patient confirms he has been provided with dental resources; verbalizes plans to follow up on resources after holidays are over; 02/23/21: patient has not utilized dental resources provided to him 01/19/21 by [employer] care guide-- was encouraged to follow up  [place]):  Patient will demonstrate Ongoing health management independence DMII; HLD  through collaboration with RN Care manager, provider, and care team.   Interventions: 1:1 collaboration with primary care provider regarding development and update of comprehensive plan of care as evidenced by provider attestation and co-signature Inter-disciplinary care team collaboration (see longitudinal plan of care) Evaluation of current treatment plan related to  self management and patient's adherence to plan as established by provider Initial assessment completed 01/22/21 Review of patient status, including review of consultants reports, relevant laboratory and other test results, and medications completed SDOH updated: no new/ unmet concerns identified Depression screening updated: no concerns identified Pain assessment updated: denies pain today Falls assessment updated: continues to deny new/ recent falls x 12 months- currently not using assistive devices;  positive reinforcement provided with encouragement to continue efforts at fall prevention; previously provided education around fall risks/ prevention reinforced Medications discussed:  reports continues to independently self-manage and denies current concerns/ issues/ questions around  medications; endorses adherence to taking all medications as prescribed Reviewed upcoming scheduled provider appointments: none noted; again reminded patient that it is time for follow up PCP office visit (last visit for routine care 11/25/21): he reports he will schedule "soon" Reviewed recent procedures for colonoscopy and bilateral cataract removal: he reports "everything going great" after recent procedures; no complications or concerns after procedures; reports :very happy" to "see everything so much better"  Discussed plans with patient for ongoing care management follow up- patient denies current care coordination/ care management needs and is agreeable to CCM RN CM case closure today; verbalizes understanding to contact PCP or other care providers for any needs that arise in the future, and confirms he has contact information for all care providers     Hyperlipidemia Interventions:  08/18/21: GOAL status: Goal Met; Long Term goal Counseled on importance of regular laboratory monitoring as prescribed Reviewed importance of limiting foods high in cholesterol Confirmed no medication concerns: he is not clear today around whether he continues taking statin- states he "needs to talk to Dr. Jenny Reichmann" about whether he should continue taking this medication; I confirmed that from previous notes, it appears that this should be continued  Diabetes Interventions: 08/18/21- Goal Status: Goal met; Long-Term Goal Provided education to patient about basic DM disease process Counseled on importance of regular laboratory monitoring as prescribed Attempted to review recent blood sugars at home: he reports he continues using CGM (FSL) but has not been monitoring "for about a week" while he waited to obtain refill on sensors; confirmed he received refill and  tells me he will applying sensor "later today;" he has no blood sugars from home monitoring to review with me today, however he denies signs/ symptoms low/ high  blood sugar and states "Ive been feeling fine" Reinforced previously provided education around self- dietary strategies for management of blood sugars: patient has ongoing very good understanding of same at baseline- positive reinforcement provided with encouragement to continue efforts at following prescribed diet Discussed/ provided education around benefits of exercise: he continues to report he "works out at home;" although he took a break with recent cataract surgery; continues to report he is hopeful to re-join gym "soon;"  Confirmed patient attended annual eye exam on 05/25/21; states "went fine"  Lab Results  Component Value Date   HGBA1C 6.8 (H) 12/25/2020      Plan: No further follow up required: patient denies current care coordination/ care management needs and is agreeable to CCM RN CM case closure today; CCM RN CM case closure acordingly     [name], RN, BSN, [facility] RN Care Coordination- [place] [phone]: direct office

## 2021-08-30 ENCOUNTER — Telehealth: Payer: HMO

## 2021-08-30 DIAGNOSIS — H3589 Other specified retinal disorders: Secondary | ICD-10-CM | POA: Diagnosis not present

## 2021-08-30 DIAGNOSIS — H43813 Vitreous degeneration, bilateral: Secondary | ICD-10-CM | POA: Diagnosis not present

## 2021-08-30 DIAGNOSIS — H33301 Unspecified retinal break, right eye: Secondary | ICD-10-CM | POA: Diagnosis not present

## 2021-09-06 DIAGNOSIS — E7800 Pure hypercholesterolemia, unspecified: Secondary | ICD-10-CM

## 2021-09-06 DIAGNOSIS — E119 Type 2 diabetes mellitus without complications: Secondary | ICD-10-CM

## 2021-09-07 ENCOUNTER — Other Ambulatory Visit: Payer: Self-pay | Admitting: Internal Medicine

## 2021-09-07 DIAGNOSIS — H31091 Other chorioretinal scars, right eye: Secondary | ICD-10-CM | POA: Diagnosis not present

## 2021-09-29 ENCOUNTER — Telehealth: Payer: Self-pay

## 2021-09-29 NOTE — Telephone Encounter (Signed)
Last OV 11/25/20 notes: Return in about 3 months (around 02/25/2021).  Pt has not had appt since then. Can schedule CPE at pt earliest convenience.

## 2021-11-09 ENCOUNTER — Ambulatory Visit: Payer: HMO | Admitting: Internal Medicine

## 2021-11-10 ENCOUNTER — Other Ambulatory Visit: Payer: Self-pay | Admitting: Internal Medicine

## 2021-11-10 ENCOUNTER — Encounter: Payer: Self-pay | Admitting: Internal Medicine

## 2021-11-10 ENCOUNTER — Ambulatory Visit (INDEPENDENT_AMBULATORY_CARE_PROVIDER_SITE_OTHER): Payer: HMO | Admitting: Internal Medicine

## 2021-11-10 VITALS — BP 128/76 | HR 86 | Ht 70.0 in | Wt 183.0 lb

## 2021-11-10 DIAGNOSIS — E7800 Pure hypercholesterolemia, unspecified: Secondary | ICD-10-CM | POA: Diagnosis not present

## 2021-11-10 DIAGNOSIS — E119 Type 2 diabetes mellitus without complications: Secondary | ICD-10-CM | POA: Diagnosis not present

## 2021-11-10 DIAGNOSIS — E538 Deficiency of other specified B group vitamins: Secondary | ICD-10-CM

## 2021-11-10 DIAGNOSIS — Z0001 Encounter for general adult medical examination with abnormal findings: Secondary | ICD-10-CM

## 2021-11-10 DIAGNOSIS — E559 Vitamin D deficiency, unspecified: Secondary | ICD-10-CM

## 2021-11-10 DIAGNOSIS — Z125 Encounter for screening for malignant neoplasm of prostate: Secondary | ICD-10-CM | POA: Diagnosis not present

## 2021-11-10 DIAGNOSIS — Z23 Encounter for immunization: Secondary | ICD-10-CM

## 2021-11-10 DIAGNOSIS — Z8673 Personal history of transient ischemic attack (TIA), and cerebral infarction without residual deficits: Secondary | ICD-10-CM | POA: Diagnosis not present

## 2021-11-10 DIAGNOSIS — R030 Elevated blood-pressure reading, without diagnosis of hypertension: Secondary | ICD-10-CM

## 2021-11-10 LAB — CBC WITH DIFFERENTIAL/PLATELET
Basophils Absolute: 0.1 10*3/uL (ref 0.0–0.1)
Basophils Relative: 0.8 % (ref 0.0–3.0)
Eosinophils Absolute: 0.2 10*3/uL (ref 0.0–0.7)
Eosinophils Relative: 3.1 % (ref 0.0–5.0)
HCT: 40.9 % (ref 39.0–52.0)
Hemoglobin: 13.5 g/dL (ref 13.0–17.0)
Lymphocytes Relative: 14.3 % (ref 12.0–46.0)
Lymphs Abs: 1 10*3/uL (ref 0.7–4.0)
MCHC: 32.9 g/dL (ref 30.0–36.0)
MCV: 81.6 fl (ref 78.0–100.0)
Monocytes Absolute: 0.4 10*3/uL (ref 0.1–1.0)
Monocytes Relative: 5.8 % (ref 3.0–12.0)
Neutro Abs: 5.2 10*3/uL (ref 1.4–7.7)
Neutrophils Relative %: 76 % (ref 43.0–77.0)
Platelets: 171 10*3/uL (ref 150.0–400.0)
RBC: 5.02 Mil/uL (ref 4.22–5.81)
RDW: 14.2 % (ref 11.5–15.5)
WBC: 6.8 10*3/uL (ref 4.0–10.5)

## 2021-11-10 LAB — LIPID PANEL
Cholesterol: 131 mg/dL (ref 0–200)
HDL: 66.2 mg/dL (ref >39.00)
LDL Cholesterol: 33 mg/dL (ref 0–99)
NonHDL: 64.7
Total CHOL/HDL Ratio: 2
Triglycerides: 161 mg/dL — ABNORMAL HIGH (ref 0.0–149.0)
VLDL: 32.2 mg/dL (ref 0.0–40.0)

## 2021-11-10 LAB — BASIC METABOLIC PANEL
BUN: 17 mg/dL (ref 6–23)
CO2: 31 mEq/L (ref 19–32)
Calcium: 9.4 mg/dL (ref 8.4–10.5)
Chloride: 104 mEq/L (ref 96–112)
Creatinine, Ser: 1.1 mg/dL (ref 0.40–1.50)
GFR: 64.87 mL/min (ref >60.00)
Glucose, Bld: 148 mg/dL — ABNORMAL HIGH (ref 70–99)
Potassium: 4.2 mEq/L (ref 3.5–5.1)
Sodium: 140 mEq/L (ref 135–145)

## 2021-11-10 LAB — MICROALBUMIN / CREATININE URINE RATIO
Creatinine,U: 130.4 mg/dL
Microalb Creat Ratio: 0.5 mg/g (ref 0.0–30.0)
Microalb, Ur: 0.7 mg/dL (ref 0.0–1.9)

## 2021-11-10 LAB — HEMOGLOBIN A1C: Hgb A1c MFr Bld: 7 % — ABNORMAL HIGH (ref 4.6–6.5)

## 2021-11-10 LAB — HEPATIC FUNCTION PANEL
ALT: 11 U/L (ref 0–53)
AST: 22 U/L (ref 0–37)
Albumin: 4.3 g/dL (ref 3.5–5.2)
Alkaline Phosphatase: 52 U/L (ref 39–117)
Bilirubin, Direct: 0.1 mg/dL (ref 0.0–0.3)
Total Bilirubin: 0.5 mg/dL (ref 0.2–1.2)
Total Protein: 7.1 g/dL (ref 6.0–8.3)

## 2021-11-10 LAB — URINALYSIS, ROUTINE W REFLEX MICROSCOPIC
Bilirubin Urine: NEGATIVE
Hgb urine dipstick: NEGATIVE
Ketones, ur: NEGATIVE
Leukocytes,Ua: NEGATIVE
Nitrite: NEGATIVE
RBC / HPF: NONE SEEN (ref 0–?)
Specific Gravity, Urine: 1.02 (ref 1.000–1.030)
Total Protein, Urine: NEGATIVE
Urine Glucose: NEGATIVE
Urobilinogen, UA: 1 (ref 0.0–1.0)
WBC, UA: NONE SEEN (ref 0–?)
pH: 6 (ref 5.0–8.0)

## 2021-11-10 LAB — PSA: PSA: 0 ng/mL — ABNORMAL LOW (ref 0.10–4.00)

## 2021-11-10 LAB — VITAMIN B12: Vitamin B-12: 757 pg/mL (ref 211–911)

## 2021-11-10 LAB — TSH: TSH: 0.28 u[IU]/mL — ABNORMAL LOW (ref 0.35–5.50)

## 2021-11-10 LAB — VITAMIN D 25 HYDROXY (VIT D DEFICIENCY, FRACTURES): VITD: 49.11 ng/mL (ref 30.00–100.00)

## 2021-11-10 MED ORDER — ATORVASTATIN CALCIUM 40 MG PO TABS: 40.0000 mg | ORAL_TABLET | Freq: Every day | ORAL | 3 refills | Status: DC

## 2021-11-10 MED ORDER — GLIPIZIDE ER 2.5 MG PO TB24: 2.5000 mg | ORAL_TABLET | Freq: Every day | ORAL | 3 refills | Status: DC

## 2021-11-10 NOTE — Assessment & Plan Note (Signed)
Last vitamin D Lab Results  Component Value Date   VD25OH 43.54 12/25/2020   Stable, cont oral replacement

## 2021-11-10 NOTE — Patient Instructions (Addendum)
Please have your Shingrix (shingles) shots done at your local pharmacy.  You had the flu shot today  Please continue all other medications as before, and refills have been done if requested.  Please have the pharmacy call with any other refills you may need.  Please continue your efforts at being more active, low cholesterol diet, and weight control.  You are otherwise up to date with prevention measures today.  Please keep your appointments with your specialists as you may have planned  You will be contacted regarding the referral for: Neurology  Please go to the LAB at the blood drawing area for the tests to be done  You will be contacted by phone if any changes need to be made immediately.  Otherwise, you will receive a letter about your results with an explanation, but please check with MyChart first.  Please remember to sign up for MyChart if you have not done so, as this will be important to you in the future with finding out test results, communicating by private email, and scheduling acute appointments online when needed.  Please make an Appointment to return in 6 months, or sooner if needed

## 2021-11-10 NOTE — Progress Notes (Signed)
Patient ID: Samuel Krueger, male   DOB: [birth year], 76 y.o.   MRN: [id number]         Chief Complaint:: wellness exam and hx of stroke, vit d, DM, hld       HPI:  Samuel Krueger is a 76 y.o. male here for wellness exam; decliens covid booster, for flu shot today, plans to have shingrix at pharmacy o/w up to date                        Also s/p bilateral cataracts recently, vision much improved but laments quite a few floaters in the right vision, seemed to be worse after the cataract surgury.  Pt denies chest pain, increased sob or doe, wheezing, orthopnea, PND, increased LE swelling, palpitations, dizziness or syncope.  Pt denies polydipsia, polyuria, or new focal neuro s/s.    Pt denies fever, wt loss, night sweats, loss of appetite, or other constitutional symptoms   Needs new referral to neurology, as missed last appt. Wt Readings from Last 3 Encounters:  11/10/21 183 lb (83 kg)  06/22/21 181 lb (82.1 kg)  06/07/21 181 lb (82.1 kg)   BP Readings from Last 3 Encounters:  11/10/21 128/76  06/22/21 134/84  02/18/21 106/72   Immunization History  Administered Date(s) Administered   Fluad Quad(high Dose 65+) 01/14/2019, 11/10/2021   PFIZER(Purple Top)SARS-COV-2 Vaccination 05/03/2019, 05/28/2019   Pneumococcal Conjugate-13 04/19/2017   Pneumococcal Polysaccharide-23 07/12/2018   Td (Adult) 09/24/2013   Tdap 09/24/2013, 03/26/2015   There are no preventive care reminders to display for this patient.     Past Medical History:  Diagnosis Date   Difficult airway for intubation    Hyperlipidemia    Nocturia    Prostate cancer [facility]) urologist-- dr Janice Norrie   active survellance since dx Feb 2014--  now has Stage T1c, Gleason (3+4)7, PSA 7.75   Recovering alcoholic in remission ([place])    since rehab 20 yrs ago-- approx 1995   S/P radiation therapy 07/15/2014   Radioactive Seed Implant- Prostate/proximal seminal vesicles, 14,500 cGy utilizing 74 I-125 seeds implanted in 30 needles with an  individual seed activity 0.658 mCi per seed for a total implant activity of 48.6.millicuries.   TIA (transient ischemic attack) 11/20/2020   Tinnitus of both ears    Type 2 diabetes, diet controlled ([place])    Past Surgical History:  Procedure Laterality Date   COLONOSCOPY  2004   Dr.Stark   COLONOSCOPY WITH PROPOFOL N/A 06/22/2021   Procedure: COLONOSCOPY WITH PROPOFOL;  Surgeon: Ladene Artist, MD;  Location: [facility];  Service: Gastroenterology;  Laterality: N/A;   POLYPECTOMY  06/22/2021   Procedure: POLYPECTOMY;  Surgeon: Ladene Artist, MD;  Location: [facility];  Service: Gastroenterology;;   PROSTATE BIOPSY  03/26/2014   RADIOACTIVE SEED IMPLANT N/A 07/15/2014   Procedure: RADIOACTIVE SEED IMPLANT/BRACHYTHERAPY IMPLANT;  Surgeon: Lowella Bandy, MD;  Location: [facility];  Service: Urology;  Laterality: N/A;    reports that he quit smoking about 21 years ago. His smoking use included cigarettes. He has never used smokeless tobacco. He reports that he does not drink alcohol and does not use drugs. family history includes Alcohol abuse in his mother; HIV/AIDS in his brother; Heart disease in his mother; Hypertension in his mother; Mental illness in an other family member. Allergies  Allergen Reactions   Jardiance [Empagliflozin] Diarrhea   Metformin And Related Other (See Comments)    Chest tightness  Shrimp [Shellfish Allergy] Nausea Only and Other (See Comments)    "strange GI reaction"/stomach turns over- no breathing issues, however   Current Outpatient Medications on File Prior to Visit  Medication Sig Dispense Refill   Blood Glucose Monitoring Suppl (ONETOUCH VERIO) w/Device KIT Use as directed twice daily E11.9 1 kit 0   Capsicum, Cayenne, (CAYENNE PEPPER PO) Take by mouth.     Cholecalciferol (VITAMIN D-3 PO) Take 1 capsule by mouth 2 (two) times a week.     Continuous Blood Gluc Receiver (FREESTYLE LIBRE 2 READER) DEVI USE TO CHECK BLOOD SUGAR TWICE  DAILY 1 each 0   Continuous Blood Gluc Sensor (FREESTYLE LIBRE 2 SENSOR) MISC Use to check blood sugar twice daily E11.9 12 each 3   Lancets MISC Use as directed twice daily E11.9 200 each 3   Omega-3 Fatty Acids (FISH OIL PO) Take by mouth.     No current facility-administered medications on file prior to visit.        ROS:  All others reviewed and negative.  Objective        PE:  BP 128/76   Pulse 86   Ht 5' 10"  (1.778 m)   Wt 183 lb (83 kg)   SpO2 97%   BMI 26.26 kg/m                 Constitutional: Pt appears in NAD               HENT: Head: NCAT.                Right Ear: External ear normal.                 Left Ear: External ear normal.                Eyes: . Pupils are equal, round, and reactive to light. Conjunctivae and EOM are normal               Nose: without d/c or deformity               Neck: Neck supple. Gross normal ROM               Cardiovascular: Normal rate and regular rhythm.                 Pulmonary/Chest: Effort normal and breath sounds without rales or wheezing.                Abd:  Soft, NT, ND, + BS, no organomegaly               Neurological: Pt is alert. At baseline orientation, motor grossly intact               Skin: Skin is warm. No rashes, no other new lesions, LE edema - none               Psychiatric: Pt behavior is normal without agitation   Micro: none  Cardiac tracings I have personally interpreted today:  none  Pertinent Radiological findings (summarize): none   Lab Results  Component Value Date   WBC 6.8 11/10/2021   HGB 13.5 11/10/2021   HCT 40.9 11/10/2021   PLT 171.0 11/10/2021   GLUCOSE 148 (H) 11/10/2021   CHOL 131 11/10/2021   TRIG 161.0 (H) 11/10/2021   HDL 66.20 11/10/2021   LDLDIRECT 98.0 08/02/2019   LDLCALC 33 11/10/2021   ALT 11 11/10/2021   AST 22  11/10/2021   NA 140 11/10/2021   K 4.2 11/10/2021   CL 104 11/10/2021   CREATININE 1.10 11/10/2021   BUN 17 11/10/2021   CO2 31 11/10/2021   TSH 0.28 (L)  11/10/2021   PSA 0.00 (L) 11/10/2021   INR 0.9 11/20/2020   HGBA1C 7.0 (H) 11/10/2021   MICROALBUR 0.7 11/10/2021   Assessment/Plan:  Samuel Krueger is a 76 y.o. Black or African American [2] male with  has a past medical history of Difficult airway for intubation, Hyperlipidemia, Nocturia, Prostate cancer ([place]) (urologist-- dr Janice Norrie), Recovering alcoholic in remission [facility]), S/P radiation therapy (07/15/2014), TIA (transient ischemic attack) (11/20/2020), Tinnitus of both ears, and Type 2 diabetes, diet controlled ([place]).  Vitamin D deficiency Last vitamin D Lab Results  Component Value Date   VD25OH 43.54 12/25/2020   Stable, cont oral replacement   History of stroke Stable overall, but needs new referral as has missed an appt per pt; cont lipitor and asa 81  Encounter for well adult exam with abnormal findings Age and sex appropriate education and counseling updated with regular exercise and diet Referrals for preventative services - none needed Immunizations addressed - declines covid booster, to have shingrx at pharmacy, for flu shot Smoking counseling  - none needed Evidence for depression or other mood disorder - none significant Most recent labs reviewed. I have personally reviewed and have noted: 1) the patient's medical and social history 2) The patient's current medications and supplements 3) The patient's height, weight, and BMI have been recorded in the chart   Type 2 diabetes, diet controlled ([place]) . Lab Results  Component Value Date   HGBA1C 7.0 (H) 11/10/2021   Stable, pt to continue current medical treatment glucotrol xl 2.5 mg qd   Hyperlipidemia Lab Results  Component Value Date   LDLCALC 33 11/10/2021   Stable, pt to continue current statin lipitor 40 mg qd   Elevated blood pressure reading without diagnosis of hypertension BP Readings from Last 3 Encounters:  11/10/21 128/76  06/22/21 134/84  02/18/21 106/72   Stable, pt to continue  medical treatment - declines low dose arb given DM for now  Followup: Return in about 6 months (around 05/12/2022).  Cathlean Cower, MD 11/13/2021 2:34 PM [facility]

## 2021-11-13 ENCOUNTER — Encounter: Payer: Self-pay | Admitting: Internal Medicine

## 2021-11-13 NOTE — Assessment & Plan Note (Addendum)
BP Readings from Last 3 Encounters:  11/10/21 128/76  06/22/21 134/84  02/18/21 106/72   Stable, pt to continue medical treatment - declines low dose arb given DM for now

## 2021-11-13 NOTE — Assessment & Plan Note (Signed)
Stable overall, but needs new referral as has missed an appt per pt; cont lipitor and asa 81

## 2021-11-13 NOTE — Assessment & Plan Note (Addendum)
Age and sex appropriate education and counseling updated with regular exercise and diet Referrals for preventative services - none needed Immunizations addressed - declines covid booster, to have shingrx at pharmacy, for flu shot Smoking counseling  - none needed Evidence for depression or other mood disorder - none significant Most recent labs reviewed. I have personally reviewed and have noted: 1) the patient's medical and social history 2) The patient's current medications and supplements 3) The patient's height, weight, and BMI have been recorded in the chart

## 2021-11-13 NOTE — Assessment & Plan Note (Signed)
.   Lab Results  Component Value Date   HGBA1C 7.0 (H) 11/10/2021   Stable, pt to continue current medical treatment glucotrol xl 2.5 mg qd

## 2021-11-13 NOTE — Assessment & Plan Note (Signed)
Lab Results  Component Value Date   LDLCALC 33 11/10/2021   Stable, pt to continue current statin lipitor 40 mg qd

## 2021-11-19 ENCOUNTER — Telehealth: Payer: Self-pay | Admitting: Internal Medicine

## 2021-11-19 NOTE — Telephone Encounter (Signed)
N/A unable to leave a message for patient to call back to schedule Medicare Annual Wellness Visit   Last AWV  68/18  Please schedule at anytime with [facility] if patient calls the office back.     Any questions, please call me at [phone]

## 2021-12-13 ENCOUNTER — Emergency Department (HOSPITAL_COMMUNITY)
Admission: EM | Admit: 2021-12-13 | Discharge: 2021-12-13 | Discharge status: Against Medical Advice | Payer: HMO | Attending: Emergency Medicine | Admitting: Emergency Medicine

## 2021-12-13 ENCOUNTER — Encounter (HOSPITAL_COMMUNITY): Payer: Self-pay | Admitting: Emergency Medicine

## 2021-12-13 ENCOUNTER — Other Ambulatory Visit: Payer: Self-pay

## 2021-12-13 ENCOUNTER — Emergency Department (HOSPITAL_COMMUNITY): Payer: HMO

## 2021-12-13 DIAGNOSIS — M25512 Pain in left shoulder: Secondary | ICD-10-CM | POA: Diagnosis not present

## 2021-12-13 DIAGNOSIS — X500XXA Overexertion from strenuous movement or load, initial encounter: Secondary | ICD-10-CM | POA: Insufficient documentation

## 2021-12-13 DIAGNOSIS — R0789 Other chest pain: Secondary | ICD-10-CM | POA: Diagnosis present

## 2021-12-13 DIAGNOSIS — Z5321 Procedure and treatment not carried out due to patient leaving prior to being seen by health care provider: Secondary | ICD-10-CM | POA: Diagnosis not present

## 2021-12-13 DIAGNOSIS — Z7902 Long term (current) use of antithrombotics/antiplatelets: Secondary | ICD-10-CM | POA: Diagnosis not present

## 2021-12-13 DIAGNOSIS — Z7982 Long term (current) use of aspirin: Secondary | ICD-10-CM | POA: Diagnosis not present

## 2021-12-13 DIAGNOSIS — R0602 Shortness of breath: Secondary | ICD-10-CM | POA: Diagnosis not present

## 2021-12-13 LAB — CBC WITH DIFFERENTIAL/PLATELET
Abs Immature Granulocytes: 0.02 10*3/uL (ref 0.00–0.07)
Basophils Absolute: 0.1 10*3/uL (ref 0.0–0.1)
Basophils Relative: 1 %
Eosinophils Absolute: 0.2 10*3/uL (ref 0.0–0.5)
Eosinophils Relative: 3 %
HCT: 42 % (ref 39.0–52.0)
Hemoglobin: 13.4 g/dL (ref 13.0–17.0)
Immature Granulocytes: 0 %
Lymphocytes Relative: 10 %
Lymphs Abs: 0.8 10*3/uL (ref 0.7–4.0)
MCH: 26.8 pg (ref 26.0–34.0)
MCHC: 31.9 g/dL (ref 30.0–36.0)
MCV: 84 fL (ref 80.0–100.0)
Monocytes Absolute: 0.5 10*3/uL (ref 0.1–1.0)
Monocytes Relative: 7 %
Neutro Abs: 6.3 10*3/uL (ref 1.7–7.7)
Neutrophils Relative %: 79 %
Platelets: 180 10*3/uL (ref 150–400)
RBC: 5 MIL/uL (ref 4.22–5.81)
RDW: 13.4 % (ref 11.5–15.5)
WBC: 7.9 10*3/uL (ref 4.0–10.5)
nRBC: 0 % (ref 0.0–0.2)

## 2021-12-13 LAB — COMPREHENSIVE METABOLIC PANEL
ALT: 12 U/L (ref 0–44)
AST: 22 U/L (ref 15–41)
Albumin: 3.8 g/dL (ref 3.5–5.0)
Alkaline Phosphatase: 45 U/L (ref 38–126)
Anion gap: 8 (ref 5–15)
BUN: 12 mg/dL (ref 8–23)
CO2: 27 mmol/L (ref 22–32)
Calcium: 9.3 mg/dL (ref 8.9–10.3)
Chloride: 105 mmol/L (ref 98–111)
Creatinine, Ser: 1.03 mg/dL (ref 0.61–1.24)
GFR, Estimated: >60 mL/min (ref >60)
Glucose, Bld: 156 mg/dL — ABNORMAL HIGH (ref 70–99)
Potassium: 4.3 mmol/L (ref 3.5–5.1)
Sodium: 140 mmol/L (ref 135–145)
Total Bilirubin: 0.8 mg/dL (ref 0.3–1.2)
Total Protein: 6.4 g/dL — ABNORMAL LOW (ref 6.5–8.1)

## 2021-12-13 LAB — TROPONIN I (HIGH SENSITIVITY): Troponin I (High Sensitivity): 6 ng/L (ref <18)

## 2021-12-13 LAB — PROTIME-INR
INR: 1 (ref 0.8–1.2)
Prothrombin Time: 13.4 seconds (ref 11.4–15.2)

## 2021-12-13 NOTE — ED Triage Notes (Signed)
Per EMS, pt reported left sided chest pain and left sided shoulder pain.  Reports pain started after he lifted something heavy.  CP has now resolved.  EKG was good.  Pt is on plavix, hx of TIA.  Pt reports he took 324 ASA prior to EMS arrival  No nitro  20G L hand.  150/105 HR 88 regular 97% RA

## 2021-12-13 NOTE — ED Notes (Signed)
Pt stated that he was unable to wait this long to see a doctor, he called his sister and she came to pick him up. This tech removed his IV prior to him leaving. Moving PT OTF.

## 2021-12-13 NOTE — ED Provider Triage Note (Signed)
Emergency Medicine Provider Triage Evaluation Note  Samuel Krueger , a 76 y.o. male  was evaluated in triage.  Pt complains of who presents with concern for chest pain that started yesterday and persisted throughout the day under the left breast radiating to the left upper back.  Pain was intermittent but present with shortness of breath, got worse.  After lifting a 24 pack of bottled water.  Patient states that he is poorly compliant with his aspirin and Plavix, states that he has not taken it for over a week.  States that he takes it "when I feel like I need it".  Review of Systems  Positive: As above Negative: As above  Physical Exam  BP (!) 133/96 (BP Location: Right Arm)   Pulse 63   Temp 99 F (37.2 C)   Resp 17   Ht '5\' 10"'$  (1.778 m)   Wt 83.3 kg   SpO2 98%   BMI 26.35 kg/m  Gen:   Awake, no distress   Resp:  Normal effort  MSK:   Moves extremities without difficulty  Other:  Lungs CTA B.  RRR   Medical Decision Making  Medically screening exam initiated at 4:49 AM.  Appropriate orders placed.  Samuel Krueger was informed that the remainder of the evaluation will be completed by another provider, this initial triage assessment does not replace that evaluation, and the importance of remaining in the ED until their evaluation is complete.  This chart was dictated using voice recognition software, Dragon. Despite the best efforts of this provider to proofread and correct errors, errors may still occur which can change documentation meaning.    Emeline Darling, PA-C 12/13/21 0451

## 2021-12-23 ENCOUNTER — Other Ambulatory Visit: Payer: Self-pay | Admitting: Internal Medicine

## 2021-12-23 NOTE — Telephone Encounter (Signed)
Please ask pt:  Is he taking Plavix (clopidogrel) now? (This was not on his med list at his oct 4 visit)  Has he seen Neurology yet and maybe they started it, or perhaps they wanted him to take aspirin only?  thanks

## 2022-08-29 ENCOUNTER — Telehealth: Payer: Self-pay | Admitting: *Deleted

## 2022-08-29 NOTE — Telephone Encounter (Signed)
I attempted to contact patient by telephone but was unsuccessful. According to the patient's chart they are due for follow up with  [facility]. I have left a HIPAA compliant message advising the patient to contact [facility] at [phone]. I will continue to follow up with the patient to make sure this appointment is scheduled.

## 2022-09-01 NOTE — Telephone Encounter (Signed)
I connected with Samuel Krueger on 7/25 at 1500 by telephone and verified that I am speaking with the correct person using two identifiers. According to the patient's chart they are due for follow up with [facility]. Pt scheduled. There are no transportation issues at this time. Nothing further was needed at the end of our conversation.

## 2022-09-13 ENCOUNTER — Ambulatory Visit: Payer: HMO | Admitting: Internal Medicine

## 2022-09-29 ENCOUNTER — Ambulatory Visit (INDEPENDENT_AMBULATORY_CARE_PROVIDER_SITE_OTHER): Payer: HMO

## 2022-09-29 VITALS — Ht 70.0 in | Wt 180.0 lb

## 2022-09-29 DIAGNOSIS — Z0000 Encounter for general adult medical examination without abnormal findings: Secondary | ICD-10-CM

## 2022-09-29 NOTE — Patient Instructions (Addendum)
Mr. Pelon , Thank you for taking time to come for your Medicare Wellness Visit. I appreciate your ongoing commitment to your health goals. Please review the following plan we discussed and let me know if I can assist you in the future.   Referrals/Orders/Follow-Ups/Clinician Recommendations: No  This is a list of the screening recommended for you and due dates:  Health Maintenance  Topic Date Due   Zoster (Shingles) Vaccine (1 of 2) Never done   COVID-19 Vaccine (3 - Pfizer risk series) 06/25/2019   Hemoglobin A1C  05/12/2022   Flu Shot  09/08/2022   Eye exam for diabetics  11/10/2022   Yearly kidney health urinalysis for diabetes  11/11/2022   Complete foot exam   11/11/2022   Yearly kidney function blood test for diabetes  12/14/2022   Medicare Annual Wellness Visit  09/29/2023   DTaP/Tdap/Td vaccine (3 - Td or Tdap) 03/25/2025   Pneumonia Vaccine  Completed   Hepatitis C Screening  Completed   HPV Vaccine  Aged Out   Colon Cancer Screening  Discontinued    Advanced directives: (Declined) Advance directive discussed with you today. Even though you declined this today, please call our office should you change your mind, and we can give you the proper paperwork for you to fill out.  Next Medicare Annual Wellness Visit scheduled for next year: Yes

## 2022-09-29 NOTE — Progress Notes (Signed)
Subjective:   Samuel Krueger is a 77 y.o. male who presents for Medicare Annual/Subsequent preventive examination.  Visit Complete: Virtual  I connected with  Liana Crocker on 09/29/22 by a audio enabled telemedicine application and verified that I am speaking with the correct person using two identifiers.  Patient Location: Home  Provider Location: Office/Clinic  I discussed the limitations of evaluation and management by telemedicine. The patient expressed understanding and agreed to proceed.  Vital Signs: Because this visit was a virtual/telehealth visit, some criteria may be missing or patient reported. Any vitals not documented were not able to be obtained and vitals that have been documented are patient reported.    Review of Systems     Cardiac Risk Factors include: advanced age (>9men, >55 women);diabetes mellitus;dyslipidemia;family history of premature cardiovascular disease;male gender;sedentary lifestyle     Objective:    Today's Vitals   09/29/22 1332  Weight: 180 lb (81.6 kg)  Height: 5\' 10"  (1.778 m)  PainSc: 0-No pain   Body mass index is 25.83 kg/m.     09/29/2022    1:35 PM 06/22/2021    6:35 AM 11/21/2020    2:02 PM 11/21/2020    2:00 PM 08/14/2018   11:00 AM 03/30/2018   10:50 AM 07/15/2016    3:42 PM  Advanced Directives  Does Patient Have a Medical Advance Directive? No No  No No No No  Does patient want to make changes to medical advance directive?       Yes (ED - Information included in AVS)  Would patient like information on creating a medical advance directive? No - Patient declined No - Patient declined No - Patient declined  Yes (MAU/Ambulatory/Procedural Areas - Information given) Yes (MAU/Ambulatory/Procedural Areas - Information given)     Current Medications (verified) Outpatient Encounter Medications as of 09/29/2022  Medication Sig   atorvastatin (LIPITOR) 40 MG tablet Take 1 tablet (40 mg total) by mouth daily.   Blood Glucose  Monitoring Suppl (ONETOUCH VERIO) w/Device KIT Use as directed twice daily E11.9   Capsicum, Cayenne, (CAYENNE PEPPER PO) Take by mouth.   Cholecalciferol (VITAMIN D-3 PO) Take 1 capsule by mouth 2 (two) times a week.   clopidogrel (PLAVIX) 75 MG tablet TAKE 1 TABLET BY MOUTH EVERY DAY   Continuous Blood Gluc Receiver (FREESTYLE LIBRE 2 READER) DEVI USE TO CHECK BLOOD SUGAR TWICE DAILY   Continuous Blood Gluc Sensor (FREESTYLE LIBRE 2 SENSOR) MISC Use to check blood sugar twice daily E11.9   glipiZIDE (GLUCOTROL XL) 2.5 MG 24 hr tablet Take 1 tablet (2.5 mg total) by mouth daily with breakfast.   Lancets MISC Use as directed twice daily E11.9   Omega-3 Fatty Acids (FISH OIL PO) Take by mouth.   ONETOUCH VERIO test strip USE AS DIRECTED TWICE A DAY   No facility-administered encounter medications on file as of 09/29/2022.    Allergies (verified) Jardiance [empagliflozin], Metformin and related, and Shrimp [shellfish allergy]   History: Past Medical History:  Diagnosis Date   Difficult airway for intubation    Hyperlipidemia    Nocturia    Prostate cancer [facility]) urologist-- dr Brunilda Payor   active survellance since dx Feb 2014--  now has Stage T1c, Gleason (3+4)7, PSA 7.75   Recovering alcoholic in remission (HCC)    since rehab 20 yrs ago-- approx 1995   S/P radiation therapy 07/15/2014   Radioactive Seed Implant- Prostate/proximal seminal vesicles, 14,500 cGy utilizing 74 I-125 seeds implanted in 30 needles with an  individual seed activity 0.658 mCi per seed for a total implant activity of 48.6.millicuries.   TIA (transient ischemic attack) 11/20/2020   Tinnitus of both ears    Type 2 diabetes, diet controlled (HCC)    Past Surgical History:  Procedure Laterality Date   COLONOSCOPY  2004   Dr.Stark   COLONOSCOPY WITH PROPOFOL N/A 06/22/2021   Procedure: COLONOSCOPY WITH PROPOFOL;  Surgeon: Meryl Dare, MD;  Location: [facility];  Service: Gastroenterology;  Laterality: N/A;    POLYPECTOMY  06/22/2021   Procedure: POLYPECTOMY;  Surgeon: Meryl Dare, MD;  Location: [facility];  Service: Gastroenterology;;   PROSTATE BIOPSY  03/26/2014   RADIOACTIVE SEED IMPLANT N/A 07/15/2014   Procedure: RADIOACTIVE SEED IMPLANT/BRACHYTHERAPY IMPLANT;  Surgeon: Su Grand, MD;  Location: [facility];  Service: Urology;  Laterality: N/A;   Family History  Problem Relation Age of Onset   Alcohol abuse Mother    Heart disease Mother    Hypertension Mother    HIV/AIDS Brother    Mental illness Other    Colon cancer Neg Hx    Esophageal cancer Neg Hx    Pancreatic cancer Neg Hx    Stomach cancer Neg Hx    Social History   Socioeconomic History   Marital status: Widowed    Spouse name: single   Number of children: 1   Years of education: 12   Highest education level: Not on file  Occupational History   Occupation: retired  Tobacco Use   Smoking status: Former    Current packs/day: 0.00    Types: Cigarettes    Start date: 09/19/1999    Quit date: 07/07/2000    Years since quitting: 22.2   Smokeless tobacco: Never  Vaping Use   Vaping status: Never Used  Substance and Sexual Activity   Alcohol use: No    Comment: Recovering alcoholic since 1993   Drug use: No   Sexual activity: Not on file  Other Topics Concern   Not on file  Social History Narrative   Not on file   Social Determinants of Health   Financial Resource Strain: Low Risk  (09/29/2022)   Overall Financial Resource Strain (CARDIA)    Difficulty of Paying Living Expenses: Not hard at all  Food Insecurity: No Food Insecurity (09/29/2022)   Hunger Vital Sign    Worried About Running Out of Food in the Last Year: Never true    Ran Out of Food in the Last Year: Never true  Transportation Needs: No Transportation Needs (09/29/2022)   PRAPARE - Administrator, Civil Service (Medical): No    Lack of Transportation (Non-Medical): No  Physical Activity: Inactive (09/29/2022)    Exercise Vital Sign    Days of Exercise per Week: 0 days    Minutes of Exercise per Session: 0 min  Stress: No Stress Concern Present (09/29/2022)   [employer] of Occupational Health - Occupational Stress Questionnaire    Feeling of Stress : Not at all  Social Connections: Moderately Integrated (09/29/2022)   Social Connection and Isolation Panel [NHANES]    Frequency of Communication with Friends and Family: More than three times a week    Frequency of Social Gatherings with Friends and Family: More than three times a week    Attends Religious Services: More than 4 times per year    Active Member of [employer] or Organizations: Yes    Attends Banker Meetings: More than 4 times per year  Marital Status: Widowed    Tobacco Counseling Counseling given: Not Answered   Clinical Intake:  Pre-visit preparation completed: Yes  Pain : No/denies pain Pain Score: 0-No pain     BMI - recorded: 25.83 Nutritional Status: BMI 25 -29 Overweight Nutritional Risks: None Diabetes: Yes CBG done?: No Did pt. bring in CBG monitor from home?: No  How often do you need to have someone help you when you read instructions, pamphlets, or other written materials from your doctor or pharmacy?: 1 - Never What is the last grade level you completed in school?: HSG  Interpreter Needed?: No  Information entered by :: Dyonna Jaspers N. Osborn Pullin, LPN.   Activities of Daily Living    09/29/2022    1:36 PM  In your present state of health, do you have any difficulty performing the following activities:  Hearing? 0  Vision? 0  Difficulty concentrating or making decisions? 0  Walking or climbing stairs? 0  Dressing or bathing? 0  Doing errands, shopping? 0  Preparing Food and eating ? N  Using the Toilet? N  In the past six months, have you accidently leaked urine? N  Do you have problems with loss of bowel control? N  Managing your Medications? N  Managing your Finances? N   Housekeeping or managing your Housekeeping? N    Patient Care Team: Corwin Levins, MD as PCP - General (Internal Medicine)  Indicate any recent Medical Services you may have received from other than [facility] providers in the past year (date may be approximate).     Assessment:   This is a routine wellness examination for [facility].  Hearing/Vision screen Hearing Screening - Comments:: Patient denied any hearing difficulty.   No hearing aids.  Vision Screening - Comments:: Patient does wear otc readers.  [facility] exam done by: [facility]   Dietary issues and exercise activities discussed:     Goals Addressed   None   Depression Screen    09/29/2022    1:36 PM 11/10/2021    1:19 PM 08/18/2021   12:25 PM 05/25/2021   11:30 AM 01/22/2021   11:45 AM 07/23/2020    9:08 AM 08/06/2019   11:31 AM  PHQ 2/9 Scores  PHQ - 2 Score 0 1 0 0 0 1 0  PHQ- 9 Score 0          Fall Risk    09/29/2022    1:36 PM 11/10/2021    1:19 PM 08/18/2021   12:25 PM 05/25/2021   11:30 AM 02/23/2021   11:30 AM  Fall Risk   Falls in the past year? 0 0 0 0 0  Comment   continunes to deny falls x last 12 months; does not use assistive devices    Number falls in past yr: 0 0 0 0 0  Injury with Fall? 0 0 0 0 0  Comment   N/A- no falls reported x last 12 months N/A- no falls reported x 12 months; currently not using assistive devices N/A- no falls reported  Risk for fall due to : No Fall Risks  No Fall Risks Medication side effect Other (Comment)  Risk for fall due to: Comment     reports occasional balance issues  Follow up Falls prevention discussed  Falls prevention discussed Falls prevention discussed Falls prevention discussed  Comment     reports has, but does not rooutinely use assistive devices (cane/ walker)    MEDICARE RISK AT HOME: Medicare Risk at Home Any  stairs in or around the home?: No If so, are there any without handrails?: No Home free of loose throw rugs in walkways, pet beds, electrical  cords, etc?: Yes Adequate lighting in your home to reduce risk of falls?: Yes Life alert?: No Use of a cane, walker or w/c?: No Grab bars in the bathroom?: No Shower chair or bench in shower?: No Elevated toilet seat or a handicapped toilet?: No  TIMED UP AND GO:  Was the test performed?  No    Cognitive Function:        09/29/2022    1:37 PM  6CIT Screen  What Year? 0 points  What month? 0 points  What time? 0 points  Count back from 20 0 points  Months in reverse 0 points  Repeat phrase 0 points  Total Score 0 points    Immunizations Immunization History  Administered Date(s) Administered   Fluad Quad(high Dose 65+) 01/14/2019, 11/10/2021   PFIZER(Purple Top)SARS-COV-2 Vaccination 05/03/2019, 05/28/2019   Pneumococcal Conjugate-13 04/19/2017   Pneumococcal Polysaccharide-23 07/12/2018   Td (Adult) 09/24/2013   Tdap 09/24/2013, 03/26/2015    TDAP status: Up to date  Flu Vaccine status: Up to date  Pneumococcal vaccine status: Up to date  Covid-19 vaccine status: Completed vaccines  Qualifies for Shingles Vaccine? Yes   Zostavax completed No   Shingrix Completed?: No.    Education has been provided regarding the importance of this vaccine. Patient has been advised to call insurance company to determine out of pocket expense if they have not yet received this vaccine. Advised may also receive vaccine at local pharmacy or Health Dept. Verbalized acceptance and understanding.  Screening Tests Health Maintenance  Topic Date Due   Zoster Vaccines- Shingrix (1 of 2) Never done   COVID-19 Vaccine (3 - Pfizer risk series) 06/25/2019   HEMOGLOBIN A1C  05/12/2022   INFLUENZA VACCINE  09/08/2022   OPHTHALMOLOGY EXAM  11/10/2022   Diabetic kidney evaluation - Urine ACR  11/11/2022   FOOT EXAM  11/11/2022   Diabetic kidney evaluation - eGFR measurement  12/14/2022   Medicare Annual Wellness (AWV)  09/29/2023   DTaP/Tdap/Td (3 - Td or Tdap) 03/25/2025   Pneumonia  Vaccine 87+ Years old  Completed   Hepatitis C Screening  Completed   HPV VACCINES  Aged Out   Colonoscopy  Discontinued    Health Maintenance  Health Maintenance Due  Topic Date Due   Zoster Vaccines- Shingrix (1 of 2) Never done   COVID-19 Vaccine (3 - Pfizer risk series) 06/25/2019   HEMOGLOBIN A1C  05/12/2022   INFLUENZA VACCINE  09/08/2022    Colorectal cancer screening: No longer required.   Lung Cancer Screening: (Low Dose CT Chest recommended if Age 41-80 years, 20 pack-year currently smoking OR have quit w/in 15years.) does not qualify.   Lung Cancer Screening Referral: no  Additional Screening:  Hepatitis C Screening: does qualify; Completed 03/26/2015  Vision Screening: Recommended annual ophthalmology exams for early detection of glaucoma and other disorders of the eye. Is the patient up to date with their annual eye exam?  Yes  Who is the provider or what is the name of the office in which the patient attends annual eye exams? Richard Fabian Sharp, MD. If pt is not established with a provider, would they like to be referred to a provider to establish care? No .   Dental Screening: Recommended annual dental exams for proper oral hygiene  Diabetic Foot Exam: Diabetic Foot Exam: Completed 11/10/2021  Community Resource Referral / Chronic Care Management: CRR required this visit?  No   CCM required this visit?  No     Plan:     I have personally reviewed and noted the following in the patient's chart:   Medical and social history Use of alcohol, tobacco or illicit drugs  Current medications and supplements including opioid prescriptions. Patient is not currently taking opioid prescriptions. Functional ability and status Nutritional status Physical activity Advanced directives List of other physicians Hospitalizations, surgeries, and ER visits in previous 12 months Vitals Screenings to include cognitive, depression, and falls Referrals and  appointments  In addition, I have reviewed and discussed with patient certain preventive protocols, quality metrics, and best practice recommendations. A written personalized care plan for preventive services as well as general preventive health recommendations were provided to patient.     Mickeal Needy, LPN   1/61/0960   After Visit Summary: (Mail) Due to this being a telephonic visit, the after visit summary with patients personalized plan was offered to patient via mail   Nurse Notes: Normal cognitive status assessed by direct observation via telephone conversation by this Nurse Health Advisor. No abnormalities found.

## 2022-10-03 DIAGNOSIS — Z961 Presence of intraocular lens: Secondary | ICD-10-CM | POA: Diagnosis not present

## 2022-10-03 DIAGNOSIS — H40013 Open angle with borderline findings, low risk, bilateral: Secondary | ICD-10-CM | POA: Diagnosis not present

## 2022-10-03 DIAGNOSIS — H43811 Vitreous degeneration, right eye: Secondary | ICD-10-CM | POA: Diagnosis not present

## 2022-10-03 DIAGNOSIS — H04123 Dry eye syndrome of bilateral lacrimal glands: Secondary | ICD-10-CM | POA: Diagnosis not present

## 2022-10-03 DIAGNOSIS — E119 Type 2 diabetes mellitus without complications: Secondary | ICD-10-CM | POA: Diagnosis not present

## 2022-10-03 LAB — HM DIABETES EYE EXAM

## 2022-10-20 DIAGNOSIS — E663 Overweight: Secondary | ICD-10-CM | POA: Diagnosis not present

## 2022-10-20 DIAGNOSIS — C61 Malignant neoplasm of prostate: Secondary | ICD-10-CM | POA: Diagnosis not present

## 2022-10-20 DIAGNOSIS — Z8673 Personal history of transient ischemic attack (TIA), and cerebral infarction without residual deficits: Secondary | ICD-10-CM | POA: Diagnosis not present

## 2022-10-20 DIAGNOSIS — H259 Unspecified age-related cataract: Secondary | ICD-10-CM | POA: Diagnosis not present

## 2022-10-20 DIAGNOSIS — E1136 Type 2 diabetes mellitus with diabetic cataract: Secondary | ICD-10-CM | POA: Diagnosis not present

## 2022-11-11 ENCOUNTER — Other Ambulatory Visit: Payer: Self-pay

## 2022-11-11 ENCOUNTER — Other Ambulatory Visit: Payer: Self-pay | Admitting: Internal Medicine

## 2022-12-11 ENCOUNTER — Other Ambulatory Visit: Payer: Self-pay | Admitting: Internal Medicine

## 2022-12-12 ENCOUNTER — Other Ambulatory Visit: Payer: Self-pay

## 2023-01-06 ENCOUNTER — Other Ambulatory Visit: Payer: Self-pay | Admitting: Internal Medicine

## 2023-04-18 ENCOUNTER — Ambulatory Visit: Admitting: Internal Medicine

## 2023-07-24 ENCOUNTER — Ambulatory Visit (INDEPENDENT_AMBULATORY_CARE_PROVIDER_SITE_OTHER): Admitting: Internal Medicine

## 2023-07-24 ENCOUNTER — Encounter: Payer: Self-pay | Admitting: Internal Medicine

## 2023-07-24 VITALS — BP 128/82 | HR 79 | Temp 98.1°F | Ht 70.0 in | Wt 188.0 lb

## 2023-07-24 DIAGNOSIS — Z7984 Long term (current) use of oral hypoglycemic drugs: Secondary | ICD-10-CM

## 2023-07-24 DIAGNOSIS — Z0001 Encounter for general adult medical examination with abnormal findings: Secondary | ICD-10-CM

## 2023-07-24 DIAGNOSIS — R7989 Other specified abnormal findings of blood chemistry: Secondary | ICD-10-CM | POA: Diagnosis not present

## 2023-07-24 DIAGNOSIS — E119 Type 2 diabetes mellitus without complications: Secondary | ICD-10-CM

## 2023-07-24 DIAGNOSIS — Z0000 Encounter for general adult medical examination without abnormal findings: Secondary | ICD-10-CM

## 2023-07-24 DIAGNOSIS — E559 Vitamin D deficiency, unspecified: Secondary | ICD-10-CM

## 2023-07-24 DIAGNOSIS — M5416 Radiculopathy, lumbar region: Secondary | ICD-10-CM | POA: Diagnosis not present

## 2023-07-24 DIAGNOSIS — E7800 Pure hypercholesterolemia, unspecified: Secondary | ICD-10-CM | POA: Diagnosis not present

## 2023-07-24 DIAGNOSIS — C61 Malignant neoplasm of prostate: Secondary | ICD-10-CM | POA: Diagnosis not present

## 2023-07-24 LAB — URINALYSIS, ROUTINE W REFLEX MICROSCOPIC
Bilirubin Urine: NEGATIVE
Hgb urine dipstick: NEGATIVE
Ketones, ur: NEGATIVE
Leukocytes,Ua: NEGATIVE
Nitrite: NEGATIVE
RBC / HPF: NONE SEEN (ref 0–?)
Specific Gravity, Urine: 1.02 (ref 1.000–1.030)
Total Protein, Urine: NEGATIVE
Urine Glucose: NEGATIVE
Urobilinogen, UA: 0.2 (ref 0.0–1.0)
WBC, UA: NONE SEEN (ref 0–?)
pH: 6 (ref 5.0–8.0)

## 2023-07-24 LAB — CBC WITH DIFFERENTIAL/PLATELET
Basophils Absolute: 0 10*3/uL (ref 0.0–0.1)
Basophils Relative: 0.7 % (ref 0.0–3.0)
Eosinophils Absolute: 0.1 10*3/uL (ref 0.0–0.7)
Eosinophils Relative: 2.3 % (ref 0.0–5.0)
HCT: 42.9 % (ref 39.0–52.0)
Hemoglobin: 13.9 g/dL (ref 13.0–17.0)
Lymphocytes Relative: 15 % (ref 12.0–46.0)
Lymphs Abs: 0.8 10*3/uL (ref 0.7–4.0)
MCHC: 32.4 g/dL (ref 30.0–36.0)
MCV: 79.9 fl (ref 78.0–100.0)
Monocytes Absolute: 0.4 10*3/uL (ref 0.1–1.0)
Monocytes Relative: 6.8 % (ref 3.0–12.0)
Neutro Abs: 4 10*3/uL (ref 1.4–7.7)
Neutrophils Relative %: 75.2 % (ref 43.0–77.0)
Platelets: 191 10*3/uL (ref 150.0–400.0)
RBC: 5.36 Mil/uL (ref 4.22–5.81)
RDW: 13.6 % (ref 11.5–15.5)
WBC: 5.4 10*3/uL (ref 4.0–10.5)

## 2023-07-24 LAB — BASIC METABOLIC PANEL WITH GFR
BUN: 18 mg/dL (ref 6–23)
CO2: 31 meq/L (ref 19–32)
Calcium: 9.2 mg/dL (ref 8.4–10.5)
Chloride: 103 meq/L (ref 96–112)
Creatinine, Ser: 1.09 mg/dL (ref 0.40–1.50)
GFR: 64.8 mL/min (ref >60.00)
Glucose, Bld: 130 mg/dL — ABNORMAL HIGH (ref 70–99)
Potassium: 4.3 meq/L (ref 3.5–5.1)
Sodium: 140 meq/L (ref 135–145)

## 2023-07-24 LAB — HEPATIC FUNCTION PANEL
ALT: 9 U/L (ref 0–53)
AST: 19 U/L (ref 0–37)
Albumin: 4.5 g/dL (ref 3.5–5.2)
Alkaline Phosphatase: 42 U/L (ref 39–117)
Bilirubin, Direct: 0 mg/dL (ref 0.0–0.3)
Total Bilirubin: 0.5 mg/dL (ref 0.2–1.2)
Total Protein: 7.5 g/dL (ref 6.0–8.3)

## 2023-07-24 LAB — MICROALBUMIN / CREATININE URINE RATIO
Creatinine,U: 102.5 mg/dL
Microalb Creat Ratio: UNDETERMINED mg/g (ref 0.0–30.0)
Microalb, Ur: <0.7 mg/dL

## 2023-07-24 LAB — LIPID PANEL
Cholesterol: 207 mg/dL — ABNORMAL HIGH (ref 0–200)
HDL: 59.5 mg/dL (ref >39.00)
LDL Cholesterol: 135 mg/dL — ABNORMAL HIGH (ref 0–99)
NonHDL: 147.52
Total CHOL/HDL Ratio: 3
Triglycerides: 62 mg/dL (ref 0.0–149.0)
VLDL: 12.4 mg/dL (ref 0.0–40.0)

## 2023-07-24 LAB — VITAMIN D 25 HYDROXY (VIT D DEFICIENCY, FRACTURES): VITD: 43.66 ng/mL (ref 30.00–100.00)

## 2023-07-24 LAB — PSA: PSA: 0.02 ng/mL — ABNORMAL LOW (ref 0.10–4.00)

## 2023-07-24 LAB — HEMOGLOBIN A1C: Hgb A1c MFr Bld: 7.4 % — ABNORMAL HIGH (ref 4.6–6.5)

## 2023-07-24 MED ORDER — ATORVASTATIN CALCIUM 40 MG PO TABS: 40.0000 mg | ORAL_TABLET | Freq: Every day | ORAL | 3 refills | Status: AC

## 2023-07-24 MED ORDER — GLIPIZIDE ER 2.5 MG PO TB24: 2.5000 mg | ORAL_TABLET | Freq: Every day | ORAL | 3 refills | Status: DC

## 2023-07-24 MED ORDER — CLOPIDOGREL BISULFATE 75 MG PO TABS: 75.0000 mg | ORAL_TABLET | Freq: Every day | ORAL | 3 refills | Status: AC

## 2023-07-24 NOTE — Progress Notes (Unsigned)
 Patient ID: Samuel Krueger, male   DOB: 09/08/1945, 78 y.o.   MRN: [id number]         Chief Complaint:: wellness exam and hx of prostate ca, abnormal TSH, depression,   DM       HPI:  Samuel Krueger is a 78 y.o. male here for wellness exam; for shingrix at pharmacy, o/w up to date                        Also has had mild worsening depressive symptoms, but no suicidal ideation, or panic; Denies hyper or hypo thyroid  symptoms such as voice, skin or hair change.  Denies urinary symptoms such as dysuria, frequency, urgency, flank pain, hematuria or n/v, fever, chills.  Pt denies chest pain, increased sob or doe, wheezing, orthopnea, PND, increased LE swelling, palpitations, dizziness or syncope.   Pt denies polydipsia, polyuria, or new focal neuro s/s, but states sugars have been labile recently and concerned about low sugars. .   Admits to some increased carbs and less active with wt increase recently.  Also with worsening left lbp with radiation to leg.    Wt Readings from Last 3 Encounters:  07/24/23 188 lb (85.3 kg)  09/29/22 180 lb (81.6 kg)  12/13/21 183 lb 10.3 oz (83.3 kg)   BP Readings from Last 3 Encounters:  07/24/23 128/82  12/13/21 (!) 133/96  11/10/21 128/76   Immunization History  Administered Date(s) Administered   Fluad Quad(high Dose 65+) 01/14/2019, 11/10/2021   PFIZER(Purple Top)SARS-COV-2 Vaccination 05/03/2019, 05/28/2019   Pneumococcal Conjugate-13 04/19/2017   Pneumococcal Polysaccharide-23 07/12/2018   Td (Adult) 09/24/2013   Tdap 09/24/2013, 03/26/2015   Health Maintenance Due  Topic Date Due   Zoster Vaccines- Shingrix (1 of 2) Never done      Past Medical History:  Diagnosis Date   Difficult airway for intubation    Hyperlipidemia    Nocturia    Prostate cancer [facility]) urologist-- dr Levi Real   active survellance since dx Feb 2014--  now has Stage T1c, Gleason (3+4)7, PSA 7.75   Recovering alcoholic in remission (HCC)    since rehab 20 yrs ago-- approx  1995   S/P radiation therapy 07/15/2014   Radioactive Seed Implant- Prostate/proximal seminal vesicles, 14,500 cGy utilizing 74 I-125 seeds implanted in 30 needles with an individual seed activity 0.658 mCi per seed for a total implant activity of 48.6.millicuries.   TIA (transient ischemic attack) 11/20/2020   Tinnitus of both ears    Type 2 diabetes, diet controlled [facility])    Past Surgical History:  Procedure Laterality Date   COLONOSCOPY  2004   Dr.Stark   COLONOSCOPY WITH PROPOFOL  N/A 06/22/2021   Procedure: COLONOSCOPY WITH PROPOFOL ;  Surgeon: Asencion Blacksmith, MD;  Location: [facility];  Service: Gastroenterology;  Laterality: N/A;   POLYPECTOMY  06/22/2021   Procedure: POLYPECTOMY;  Surgeon: Asencion Blacksmith, MD;  Location: [facility];  Service: Gastroenterology;;   PROSTATE BIOPSY  03/26/2014   RADIOACTIVE SEED IMPLANT N/A 07/15/2014   Procedure: RADIOACTIVE SEED IMPLANT/BRACHYTHERAPY IMPLANT;  Surgeon: Jock Muller, MD;  [facility]: [facility];  Service: Urology;  Laterality: N/A;    reports that he quit smoking about 23 years ago. His smoking use included cigarettes. He started smoking about 23 years ago. He has never used smokeless tobacco. He reports that he does not drink alcohol and does not use drugs. family history includes Alcohol abuse in his mother; HIV/AIDS in his brother;  Heart disease in his mother; Hypertension in his mother; Mental illness in an other family member. Allergies  Allergen Reactions   Jardiance  [Empagliflozin ] Diarrhea   Metformin And Related Other (See Comments)    Chest tightness   Shrimp [Shellfish Allergy] Nausea Only and Other (See Comments)    strange GI reaction/stomach turns over- no breathing issues, however   Current Outpatient Medications on File Prior to Visit  Medication Sig Dispense Refill   Blood Glucose Monitoring Suppl (ONETOUCH VERIO) w/Device KIT Use as directed twice daily E11.9 1 kit 0   Continuous Blood Gluc  Receiver (FREESTYLE LIBRE 2 READER) DEVI USE TO CHECK BLOOD SUGAR TWICE DAILY 1 each 0   Continuous Glucose Sensor (FREESTYLE LIBRE 2 SENSOR) MISC USE TO CHECK BLOOD SUGAR TWICE DAILY 6 each 7   Lancets MISC Use as directed twice daily E11.9 200 each 3   Omega-3 Fatty Acids (FISH OIL PO) Take by mouth.     ONETOUCH VERIO test strip USE AS DIRECTED TWICE A DAY 200 strip 12   No current facility-administered medications on file prior to visit.        ROS:  All others reviewed and negative.  Objective        PE:  BP 128/82 (BP Location: Left Arm, Patient Position: Sitting, Cuff Size: Normal)   Pulse 79   Temp 98.1 F (36.7 C) (Oral)   Ht 5' 10 (1.778 m)   Wt 188 lb (85.3 kg)   SpO2 99%   BMI 26.98 kg/m                 Constitutional: Pt appears in NAD               HENT: Head: NCAT.                Right Ear: External ear normal.                 Left Ear: External ear normal.                Eyes: . Pupils are equal, round, and reactive to light. Conjunctivae and EOM are normal               Nose: without d/c or deformity               Neck: Neck supple. Gross normal ROM               Cardiovascular: Normal rate and regular rhythm.                 Pulmonary/Chest: Effort normal and breath sounds without rales or wheezing.                Abd:  Soft, NT, ND, + BS, no organomegaly               Neurological: Pt is alert. At baseline orientation, motor grossly intact               Skin: Skin is warm. No rashes, no other new lesions, LE edema - none               Psychiatric: Pt behavior is normal without agitation   Micro: none  Cardiac tracings I have personally interpreted today:  none  Pertinent Radiological findings (summarize): none   Lab Results  Component Value Date   WBC 5.4 07/24/2023   HGB 13.9 07/24/2023   HCT 42.9 07/24/2023   PLT 191.0 07/24/2023  GLUCOSE 130 (H) 07/24/2023   CHOL 207 (H) 07/24/2023   TRIG 62.0 07/24/2023   HDL 59.50 07/24/2023   LDLDIRECT  98.0 08/02/2019   LDLCALC 135 (H) 07/24/2023   ALT 9 07/24/2023   AST 19 07/24/2023   NA 140 07/24/2023   K 4.3 07/24/2023   CL 103 07/24/2023   CREATININE 1.09 07/24/2023   BUN 18 07/24/2023   CO2 31 07/24/2023   TSH 0.44 07/24/2023   PSA 0.02 (L) 07/24/2023   INR 1.0 12/13/2021   HGBA1C 7.4 (H) 07/24/2023   MICROALBUR <0.7 07/24/2023   Assessment/Plan:  Samuel Krueger is a 78 y.o. Black or African American [2] male with  has a past medical history of Difficult airway for intubation, Hyperlipidemia, Nocturia, Prostate cancer (HCC) (urologist-- dr Levi Real), Recovering alcoholic in remission [facility]), S/P radiation therapy (07/15/2014), TIA (transient ischemic attack) (11/20/2020), Tinnitus of both ears, and Type 2 diabetes, diet controlled (HCC).  Encounter for well adult exam with abnormal findings Age and sex appropriate education and counseling updated with regular exercise and diet Referrals for preventative services - none needed Immunizations addressed - for shingrix at pharmacy Smoking counseling  - none needed Evidence for depression or other mood disorder - none significant Most recent labs reviewed. I have personally reviewed and have noted: 1) the patient's medical and social history 2) The patient's current medications and supplements 3) The patient's height, weight, and BMI have been recorded in the chart   Prostate cancer [facility]) For f/u psa, asymptomatic  Hyperlipidemia Lab Results  Component Value Date   LDLCALC 135 (H) 07/24/2023   uncontrolled, pt to restart lipitor 40 qd   Type 2 diabetes, diet controlled (HCC) Lab Results  Component Value Date   HGBA1C 7.4 (H) 07/24/2023   Uncontrolled, to cont glipizide  ER 2.5 every day, declines other change for now   Vitamin D  deficiency Last vitamin D  Lab Results  Component Value Date   VD25OH 43.66 07/24/2023   Stable, cont oral replacement   Abnormal TSH Mild low last yr, now for thyroid  panel r/o  hyperthyroidism  Left lumbar radiculopathy With 2 wks recent worsening moderate, Now for orthopedic referral.    Followup: Return in about 6 months (around 01/23/2024).  Rosalia Colonel, MD 07/25/2023 7:21 PM [facility] Internal Medicine

## 2023-07-24 NOTE — Patient Instructions (Addendum)
 Please have your Shingrix (shingles) shots done at your local pharmacy.  Please continue all other medications as before, and refills have been done if requested.  Please have the pharmacy call with any other refills you may need.  Please continue your efforts at being more active, low cholesterol diet, and weight control.  You are otherwise up to date with prevention measures today.  Please keep your appointments with your specialists as you may have planned  You will be contacted regarding the referral for: orthopedic  Please go to the LAB at the blood drawing area for the tests to be done  You will be contacted by phone if any changes need to be made immediately.  Otherwise, you will receive a letter about your results with an explanation, but please check with MyChart first.  Please make an Appointment to return in 6 months, or sooner if needed

## 2023-07-25 ENCOUNTER — Ambulatory Visit: Payer: Self-pay | Admitting: Internal Medicine

## 2023-07-25 ENCOUNTER — Encounter: Payer: Self-pay | Admitting: Internal Medicine

## 2023-07-25 DIAGNOSIS — R7989 Other specified abnormal findings of blood chemistry: Secondary | ICD-10-CM | POA: Insufficient documentation

## 2023-07-25 LAB — THYROID PANEL WITH TSH
Free Thyroxine Index: 2.3 (ref 1.4–3.8)
T3 Uptake: 33 % (ref 22–35)
T4, Total: 6.9 ug/dL (ref 4.9–10.5)
TSH: 0.44 m[IU]/L (ref 0.40–4.50)

## 2023-07-25 NOTE — Assessment & Plan Note (Signed)
 For f/u psa, asymptomatic

## 2023-07-25 NOTE — Assessment & Plan Note (Signed)
 Last vitamin D  Lab Results  Component Value Date   VD25OH 43.66 07/24/2023   Stable, cont oral replacement

## 2023-07-25 NOTE — Assessment & Plan Note (Signed)
 Lab Results  Component Value Date   LDLCALC 135 (H) 07/24/2023   uncontrolled, pt to restart lipitor 40 qd

## 2023-07-25 NOTE — Assessment & Plan Note (Signed)
 Mild low last yr, now for thyroid  panel r/o hyperthyroidism

## 2023-07-25 NOTE — Assessment & Plan Note (Signed)

## 2023-07-25 NOTE — Assessment & Plan Note (Signed)
 Lab Results  Component Value Date   HGBA1C 7.4 (H) 07/24/2023   Uncontrolled, to cont glipizide  ER 2.5 every day, declines other change for now

## 2023-07-25 NOTE — Assessment & Plan Note (Addendum)
 With 2 wks recent worsening moderate, Now for orthopedic referral.

## 2023-08-07 ENCOUNTER — Ambulatory Visit (INDEPENDENT_AMBULATORY_CARE_PROVIDER_SITE_OTHER): Admitting: Podiatry

## 2023-08-07 ENCOUNTER — Encounter: Payer: Self-pay | Admitting: Podiatry

## 2023-08-07 VITALS — Ht 70.0 in | Wt 188.0 lb

## 2023-08-07 DIAGNOSIS — M79675 Pain in left toe(s): Secondary | ICD-10-CM | POA: Diagnosis not present

## 2023-08-07 DIAGNOSIS — B351 Tinea unguium: Secondary | ICD-10-CM | POA: Diagnosis not present

## 2023-08-07 DIAGNOSIS — M79674 Pain in right toe(s): Secondary | ICD-10-CM

## 2023-08-07 NOTE — Progress Notes (Signed)
   Chief Complaint  Patient presents with   Nail Problem    Pt is here for [facility] and concern with bilateral toenail fungus.    SUBJECTIVE Patient with a history of diabetes mellitus presents to office today complaining of elongated, thickened nails that cause pain while ambulating in shoes.  Patient is unable to trim their own nails. Patient is here for further evaluation and treatment.  Past Medical History:  Diagnosis Date   Difficult airway for intubation    Hyperlipidemia    Nocturia    Prostate cancer [facility]) urologist-- dr aleene   active survellance since dx Feb 2014--  now has Stage T1c, Gleason (3+4)7, PSA 7.75   Recovering alcoholic in remission (HCC)    since rehab 20 yrs ago-- approx 1995   S/P radiation therapy 07/15/2014   Radioactive Seed Implant- Prostate/proximal seminal vesicles, 14,500 cGy utilizing 74 I-125 seeds implanted in 30 needles with an individual seed activity 0.658 mCi per seed for a total implant activity of 48.6.millicuries.   TIA (transient ischemic attack) 11/20/2020   Tinnitus of both ears    Type 2 diabetes, diet controlled (HCC)     Allergies  Allergen Reactions   Jardiance  [Empagliflozin ] Diarrhea   Metformin And Related Other (See Comments)    Chest tightness   Shrimp [Shellfish Allergy] Nausea Only and Other (See Comments)    strange GI reaction/stomach turns over- no breathing issues, however     OBJECTIVE General Patient is awake, alert, and oriented x 3 and in no acute distress. Derm Skin is dry and supple bilateral. Negative open lesions or macerations. Remaining integument unremarkable. Nails are tender, long, thickened and dystrophic with subungual debris, consistent with onychomycosis, 1-5 bilateral. No signs of infection noted. Vasc  DP and PT pedal pulses palpable bilaterally. Temperature gradient within normal limits.  Neuro Epicritic and protective threshold sensation diminished bilaterally.  Musculoskeletal Exam No symptomatic  pedal deformities noted bilateral. Muscular strength within normal limits.  ASSESSMENT 1. Diabetes Mellitus w/ peripheral neuropathy 2.  Pain due to onychomycosis of toenails bilateral  PLAN OF CARE 1. Patient evaluated today. 2. Instructed to maintain good pedal hygiene and foot care. Stressed importance of controlling blood sugar.  3. Mechanical debridement of nails 1-5 bilaterally performed using a nail nipper. Filed with dremel without incident.  4. Return to clinic in 3 mos.     Thresa EMERSON Sar, DPM [facility]  Dr. Thresa EMERSON Sar, DPM    [address] [place], [place] [address]                Office [phone]  Fax [phone]

## 2023-10-02 ENCOUNTER — Ambulatory Visit (INDEPENDENT_AMBULATORY_CARE_PROVIDER_SITE_OTHER): Payer: HMO

## 2023-10-02 VITALS — Ht 70.0 in | Wt 188.0 lb

## 2023-10-02 DIAGNOSIS — Z0000 Encounter for general adult medical examination without abnormal findings: Secondary | ICD-10-CM | POA: Diagnosis not present

## 2023-10-02 NOTE — Patient Instructions (Addendum)
 [name] , Thank you for taking time out of your busy schedule to complete your Annual Wellness Visit with me. I enjoyed our conversation and look forward to speaking with you again next year. I, as well as your care team,  appreciate your ongoing commitment to your health goals. Please review the following plan we discussed and let me know if I can assist you in the future. Your Game plan/ To Do List    Referrals: If you haven't heard from the office you've been referred to, please reach out to them at the phone provided.   Follow up Visits: We will see or speak with you next year for your Next Medicare AWV with our clinical staff Have you seen your provider in the last 6 months (3 months if uncontrolled diabetes)? Yes  Clinician Recommendations:  Aim for 30 minutes of exercise or brisk walking, 6-8 glasses of water , and 5 servings of fruits and vegetables each day. Educated and advised on getting the Influenza and Shingles vaccines in 2025.      This is a list of the screenings recommended for you:  Health Maintenance  Topic Date Due   Zoster (Shingles) Vaccine (1 of 2) Never done   Flu Shot  09/08/2023   Eye exam for diabetics  10/03/2023   Hemoglobin A1C  01/23/2024   Yearly kidney function blood test for diabetes  07/23/2024   Yearly kidney health urinalysis for diabetes  07/23/2024   Complete foot exam   07/23/2024   Medicare Annual Wellness Visit  10/01/2024   DTaP/Tdap/Td vaccine (3 - Td or Tdap) 03/25/2025   Pneumococcal Vaccine for age over 78  Completed   Hepatitis C Screening  Completed   HPV Vaccine  Aged Out   Meningitis B Vaccine  Aged Out   Colon Cancer Screening  Discontinued   COVID-19 Vaccine  Discontinued    Advanced directives: (Declined) Advance directive discussed with you today. Even though you declined this today, please call our office should you change your mind, and we can give you the proper paperwork for you to fill out. Advance Care Planning is  important because it:  [x]  Makes sure you receive the medical care that is consistent with your values, goals, and preferences  [x]  It provides guidance to your family and loved ones and reduces their decisional burden about whether or not they are making the right decisions based on your wishes.  Follow the link provided in your after visit summary or read over the paperwork we have mailed to you to help you started getting your Advance Directives in place. If you need assistance in completing these, please reach out to us  so that we can help you!

## 2023-10-02 NOTE — Progress Notes (Signed)
 Subjective:   Samuel Krueger is a 78 y.o. who presents for a Medicare Wellness preventive visit.  As a reminder, Annual Wellness Visits don't include a physical exam, and some assessments may be limited, especially if this visit is performed virtually. We may recommend an in-person follow-up visit with your provider if needed.  Visit Complete: Virtual I connected with  [name] on 10/02/23 by a audio enabled telemedicine application and verified that I am speaking with the correct person using two identifiers.  Patient Location: Home  Provider Location: Office/Clinic  I discussed the limitations of evaluation and management by telemedicine. The patient expressed understanding and agreed to proceed.  Vital Signs: Because this visit was a virtual/telehealth visit, some criteria may be missing or patient reported. Any vitals not documented were not able to be obtained and vitals that have been documented are patient reported.  VideoDeclined- This patient declined Librarian, academic. Therefore the visit was completed with audio only.  Persons Participating in Visit: Patient.  AWV Questionnaire: No: Patient Medicare AWV questionnaire was not completed prior to this visit.  Cardiac Risk Factors include: advanced age (>82men, >1 women);diabetes mellitus;dyslipidemia;male gender     Objective:    Today's Vitals   10/02/23 1537  Weight: 188 lb (85.3 kg)  Height: 5' 10 (1.778 m)   Body mass index is 26.98 kg/m.     10/02/2023    3:50 PM 09/29/2022    1:35 PM 06/22/2021    6:35 AM 11/21/2020    2:02 PM 11/21/2020    2:00 PM 08/14/2018   11:00 AM 03/30/2018   10:50 AM  Advanced Directives  Does Patient Have a Medical Advance Directive? No No No  No No No   Would patient like information on creating a medical advance directive? No - Patient declined No - Patient declined No - Patient declined No - Patient declined  Yes (MAU/Ambulatory/Procedural  Areas - Information given)  Yes (MAU/Ambulatory/Procedural Areas - Information given)      Data saved with a previous flowsheet row definition    Current Medications (verified) Outpatient Encounter Medications as of 10/02/2023  Medication Sig   Blood Glucose Monitoring Suppl (ONETOUCH VERIO) w/Device KIT Use as directed twice daily E11.9   clopidogrel  (PLAVIX ) 75 MG tablet Take 1 tablet (75 mg total) by mouth daily.   Continuous Blood Gluc Receiver (FREESTYLE LIBRE 2 READER) DEVI USE TO CHECK BLOOD SUGAR TWICE DAILY   Continuous Glucose Sensor (FREESTYLE LIBRE 2 SENSOR) MISC USE TO CHECK BLOOD SUGAR TWICE DAILY   glipiZIDE  (GLUCOTROL  XL) 2.5 MG 24 hr tablet Take 1 tablet (2.5 mg total) by mouth daily with breakfast.   Lancets MISC Use as directed twice daily E11.9   Omega-3 Fatty Acids (FISH OIL PO) Take by mouth.   ONETOUCH VERIO test strip USE AS DIRECTED TWICE A DAY   atorvastatin  (LIPITOR) 40 MG tablet Take 1 tablet (40 mg total) by mouth daily.   No facility-administered encounter medications on file as of 10/02/2023.    Allergies (verified) Jardiance  [empagliflozin ], Metformin and related, and Shrimp [shellfish allergy]   History: Past Medical History:  Diagnosis Date   Difficult airway for intubation    Hyperlipidemia    Nocturia    Prostate cancer [facility]) urologist-- dr aleene   active survellance since dx Feb 2014--  now has Stage T1c, Gleason (3+4)7, PSA 7.75   Recovering alcoholic in remission (HCC)    since rehab 20 yrs ago-- approx 1995  S/P radiation therapy 07/15/2014   Radioactive Seed Implant- Prostate/proximal seminal vesicles, 14,500 cGy utilizing 74 I-125 seeds implanted in 30 needles with an individual seed activity 0.658 mCi per seed for a total implant activity of 48.6.millicuries.   TIA (transient ischemic attack) 11/20/2020   Tinnitus of both ears    Type 2 diabetes, diet controlled [facility])    Past Surgical History:  Procedure Laterality Date   COLONOSCOPY   2004   Dr.Stark   COLONOSCOPY WITH PROPOFOL  N/A 06/22/2021   Procedure: COLONOSCOPY WITH PROPOFOL ;  Surgeon: Aneita Gwendlyn DASEN, MD;  Location: [facility];  Service: Gastroenterology;  Laterality: N/A;   POLYPECTOMY  06/22/2021   Procedure: POLYPECTOMY;  Surgeon: Aneita Gwendlyn DASEN, MD;  Location: [facility];  Service: Gastroenterology;;   PROSTATE BIOPSY  03/26/2014   RADIOACTIVE SEED IMPLANT N/A 07/15/2014   Procedure: RADIOACTIVE SEED IMPLANT/BRACHYTHERAPY IMPLANT;  Surgeon: Oliva Oiler, MD;  Location: [facility];  Service: Urology;  Laterality: N/A;   Family History  Problem Relation Age of Onset   Alcohol abuse Mother    Heart disease Mother    Hypertension Mother    HIV/AIDS Brother    Mental illness Other    Colon cancer Neg Hx    Esophageal cancer Neg Hx    Pancreatic cancer Neg Hx    Stomach cancer Neg Hx    Social History   Socioeconomic History   Marital status: Widowed    Spouse name: single   Number of children: 1   Years of education: 12   Highest education level: Not on file  Occupational History   Occupation: retired  Tobacco Use   Smoking status: Former    Current packs/day: 0.00    Average packs/day: 1 pack/day for 0.8 years (0.8 ttl pk-yrs)    Types: Cigarettes    Start date: 09/19/1999    Quit date: 07/07/2000    Years since quitting: 23.2   Smokeless tobacco: Never  Vaping Use   Vaping status: Never Used  Substance and Sexual Activity   Alcohol use: No    Comment: Recovering alcoholic since 1993   Drug use: No   Sexual activity: Not Currently  Other Topics Concern   Not on file  Social History Narrative   Not on file   Social Drivers of Health   Financial Resource Strain: Low Risk  (10/02/2023)   Overall Financial Resource Strain (CARDIA)    Difficulty of Paying Living Expenses: Not hard at all  Food Insecurity: No Food Insecurity (10/02/2023)   Hunger Vital Sign    Worried About Running Out of Food in the Last Year: Never true     Ran Out of Food in the Last Year: Never true  Transportation Needs: No Transportation Needs (10/02/2023)   PRAPARE - Administrator, Civil Service (Medical): No    Lack of Transportation (Non-Medical): No  Physical Activity: Inactive (10/02/2023)   Exercise Vital Sign    Days of Exercise per Week: 0 days    Minutes of Exercise per Session: 0 min  Stress: No Stress Concern Present (10/02/2023)   [employer] of Occupational Health - Occupational Stress Questionnaire    Feeling of Stress: Not at all  Social Connections: Socially Isolated (10/02/2023)   Social Connection and Isolation Panel    Frequency of Communication with Friends and Family: More than three times a week    Frequency of Social Gatherings with Friends and Family: Once a week    Attends Religious Services:  Never    Active Member of Clubs or Organizations: No    Attends Banker Meetings: Never    Marital Status: Widowed    Tobacco Counseling Counseling given: No    Clinical Intake:  Pre-visit preparation completed: Yes  Pain : No/denies pain     BMI - recorded: 26.98 Nutritional Status: BMI 25 -29 Overweight Nutritional Risks: None Diabetes: Yes CBG done?: Yes CBG resulted in Enter/ Edit results?: Yes (fasting - 89) Did pt. bring in CBG monitor from home?: No  Lab Results  Component Value Date   HGBA1C 7.4 (H) 07/24/2023   HGBA1C 7.0 (H) 11/10/2021   HGBA1C 6.8 (H) 12/25/2020     How often do you need to have someone help you when you read instructions, pamphlets, or other written materials from your doctor or pharmacy?: 1 - Never  Interpreter Needed?: No  Information entered by :: Verdie Saba, CMA   Activities of Daily Living     10/02/2023    3:39 PM  In your present state of health, do you have any difficulty performing the following activities:  Hearing? 0  Vision? 0  Difficulty concentrating or making decisions? 0  Walking or climbing stairs? 0  Dressing  or bathing? 0  Doing errands, shopping? 0  Preparing Food and eating ? N  Using the Toilet? N  In the past six months, have you accidently leaked urine? N  Do you have problems with loss of bowel control? N  Managing your Medications? N  Managing your Finances? N  Housekeeping or managing your Housekeeping? N    Patient Care Team: Norleen Lynwood ORN, MD as PCP - General (Internal Medicine)  I have updated your Care Teams any recent Medical Services you may have received from other providers in the past year.     Assessment:   This is a routine wellness examination for [facility].  Hearing/Vision screen Hearing Screening - Comments:: Denies hearing difficulties   Vision Screening - Comments:: Denies vision concerns    Goals Addressed               This Visit's Progress     Patient Stated (pt-stated)        Patient stated he plans to continue managing medications and join a gym       Depression Screen     10/02/2023    3:41 PM 09/29/2022    1:36 PM 11/10/2021    1:19 PM 08/18/2021   12:25 PM 05/25/2021   11:30 AM 01/22/2021   11:45 AM 07/23/2020    9:08 AM  PHQ 2/9 Scores  PHQ - 2 Score 0 0 1 0 0 0 1  PHQ- 9 Score 0 0         Fall Risk     10/02/2023    3:40 PM 07/24/2023   10:43 AM 09/29/2022    1:36 PM 11/10/2021    1:19 PM 08/18/2021   12:25 PM  Fall Risk   Falls in the past year? 0 0 0 0 0  Comment     continunes to deny falls x last 12 months; does not use assistive devices  Number falls in past yr: 0 0 0 0 0  Injury with Fall? 0 0 0 0 0  Comment     N/A- no falls reported x last 12 months  Risk for fall due to : No Fall Risks  No Fall Risks  No Fall Risks  Follow up Falls evaluation completed;Falls prevention  discussed Falls evaluation completed Falls prevention discussed  Falls prevention discussed      Data saved with a previous flowsheet row definition    MEDICARE RISK AT HOME:  Medicare Risk at Home Any stairs in or around the home?: Yes (outside) If so,  are there any without handrails?: No Home free of loose throw rugs in walkways, pet beds, electrical cords, etc?: Yes Adequate lighting in your home to reduce risk of falls?: Yes Life alert?: No Use of a cane, walker or w/c?: No Grab bars in the bathroom?: No Shower chair or bench in shower?: No Elevated toilet seat or a handicapped toilet?: No  TIMED UP AND GO:  Was the test performed?  No  Cognitive Function: 6CIT completed        10/02/2023    3:45 PM 09/29/2022    1:37 PM  6CIT Screen  What Year? 0 points 0 points  What month? 0 points 0 points  What time? 0 points 0 points  Count back from 20 0 points 0 points  Months in reverse 0 points 0 points  Repeat phrase 0 points 0 points  Total Score 0 points 0 points    Immunizations Immunization History  Administered Date(s) Administered   Fluad Quad(high Dose 65+) 01/14/2019, 11/10/2021   PFIZER(Purple Top)SARS-COV-2 Vaccination 05/03/2019, 05/28/2019   Pneumococcal Conjugate-13 04/19/2017   Pneumococcal Polysaccharide-23 07/12/2018   Td (Adult) 09/24/2013   Tdap 09/24/2013, 03/26/2015    Screening Tests Health Maintenance  Topic Date Due   Zoster Vaccines- Shingrix (1 of 2) Never done   INFLUENZA VACCINE  09/08/2023   OPHTHALMOLOGY EXAM  10/03/2023   HEMOGLOBIN A1C  01/23/2024   Diabetic kidney evaluation - eGFR measurement  07/23/2024   Diabetic kidney evaluation - Urine ACR  07/23/2024   FOOT EXAM  07/23/2024   Medicare Annual Wellness (AWV)  10/01/2024   DTaP/Tdap/Td (3 - Td or Tdap) 03/25/2025   Pneumococcal Vaccine: 50+ Years  Completed   Hepatitis C Screening  Completed   HPV VACCINES  Aged Out   Meningococcal B Vaccine  Aged Out   Colonoscopy  Discontinued   COVID-19 Vaccine  Discontinued    Health Maintenance  Health Maintenance Due  Topic Date Due   Zoster Vaccines- Shingrix (1 of 2) Never done   INFLUENZA VACCINE  09/08/2023   Health Maintenance Items Addressed: 10/02/2023  Additional  Screening:  Vision Screening: Recommended annual ophthalmology exams for early detection of glaucoma and other disorders of the eye. Would you like a referral to an eye doctor? No    Dental Screening: Recommended annual dental exams for proper oral hygiene  Community Resource Referral / Chronic Care Management: CRR required this visit?  No   CCM required this visit?  No   Plan:    I have personally reviewed and noted the following in the patient's chart:   Medical and social history Use of alcohol, tobacco or illicit drugs  Current medications and supplements including opioid prescriptions. Patient is not currently taking opioid prescriptions. Functional ability and status Nutritional status Physical activity Advanced directives List of other physicians Hospitalizations, surgeries, and ER visits in previous 12 months Vitals Screenings to include cognitive, depression, and falls Referrals and appointments  In addition, I have reviewed and discussed with patient certain preventive protocols, quality metrics, and best practice recommendations. A written personalized care plan for preventive services as well as general preventive health recommendations were provided to patient.   Verdie CHRISTELLA Saba, CMA   10/02/2023  After Visit Summary: (MyChart) Due to this being a telephonic visit, the after visit summary with patients personalized plan was offered to patient via MyChart   Notes: Nothing significant to report at this time.

## 2023-11-29 ENCOUNTER — Encounter: Payer: Self-pay | Admitting: Internal Medicine

## 2023-11-29 ENCOUNTER — Ambulatory Visit: Admitting: Internal Medicine

## 2023-11-29 VITALS — BP 138/84 | Temp 98.0°F | Ht 70.0 in | Wt 191.0 lb

## 2023-11-29 DIAGNOSIS — K5900 Constipation, unspecified: Secondary | ICD-10-CM | POA: Diagnosis not present

## 2023-11-29 DIAGNOSIS — E7800 Pure hypercholesterolemia, unspecified: Secondary | ICD-10-CM

## 2023-11-29 DIAGNOSIS — Z7984 Long term (current) use of oral hypoglycemic drugs: Secondary | ICD-10-CM

## 2023-11-29 DIAGNOSIS — M5416 Radiculopathy, lumbar region: Secondary | ICD-10-CM

## 2023-11-29 DIAGNOSIS — E1165 Type 2 diabetes mellitus with hyperglycemia: Secondary | ICD-10-CM

## 2023-11-29 DIAGNOSIS — E119 Type 2 diabetes mellitus without complications: Secondary | ICD-10-CM

## 2023-11-29 DIAGNOSIS — H04123 Dry eye syndrome of bilateral lacrimal glands: Secondary | ICD-10-CM

## 2023-11-29 MED ORDER — DOCUSATE SODIUM 100 MG PO CAPS: 100.0000 mg | ORAL_CAPSULE | Freq: Two times a day (BID) | ORAL | 5 refills | Status: AC

## 2023-11-29 MED ORDER — POLYETHYLENE GLYCOL 3350 17 GM/SCOOP PO POWD: 17.0000 g | Freq: Every day | ORAL | 2 refills | Status: AC

## 2023-11-29 NOTE — Assessment & Plan Note (Signed)
 Worsening recently but afeb, no  pain or n/v or bleeding - ok for start Miralax  17 gm daily, colace 100 mg bid, and dulcolox prn only

## 2023-11-29 NOTE — Assessment & Plan Note (Signed)
 Lab Results  Component Value Date   LDLCALC 135 (H) 07/24/2023   Uncontrolled after pt missed his cholesterol med for some time, now taking it daily, pt to continue current statin lipitor 40 mg and f/u lab next visit, declines lab today

## 2023-11-29 NOTE — Patient Instructions (Addendum)
 Please take all new medication as prescribed - the miralax  every day, and the colace (stool softner) twice per day  Please continue all other medications as before, including the OTC Dulcolox as needed, and the cholesterol medication  Please have the pharmacy call with any other refills you may need.  Please continue your efforts at being more active, low cholesterol diet, and weight control.  Please keep your appointments with your specialists as you may have planned  You will be contacted regarding the referral for: Orthopedic for the left leg, and Eye doctor  We can hold on lab testing today  Please make an Appointment to return in Dec 17, or sooner if needed

## 2023-11-29 NOTE — Assessment & Plan Note (Addendum)
 Lab Results  Component Value Date   HGBA1C 7.4 (H) 07/24/2023   Mild uncontrolled, pt to continue current medical treatment glucotrol  xl 2.5 mg and recheck lab;  Without insulin , with hyperglycemia;  also refer optho (pt asks for change in optho provider)

## 2023-11-29 NOTE — Assessment & Plan Note (Signed)
 Persistent, pt for orthopedic referral

## 2023-11-29 NOTE — Progress Notes (Signed)
 Patient ID: Samuel Krueger, male   DOB: Mar 29, 1945, 78 y.o.   MRN: [id number]        Chief Complaint: follow up HTN, HLD, DM, left lumbar radiculopathy, dry eyes, constipation       HPI:  Samuel Krueger is a 78 y.o. male here with c/o bloating and difficult to pass hard stools ongoing for the past 1-2 months getting worse it seems.  Denies worsening reflux, abd pain, dysphagia, n/v, or blood.  Has tried dulcolox otc last wk with improvement.  Pt denies chest pain, increased sob or doe, wheezing, orthopnea, PND, increased LE swelling, palpitations, dizziness or syncope.   Pt denies polydipsia, polyuria, or new focal neuro s/s.  Still having left leg pain and numbness, pt states he missed calling back orthopedic appt , but willing to do now.         Wt Readings from Last 3 Encounters:  11/29/23 191 lb (86.6 kg)  10/02/23 188 lb (85.3 kg)  08/07/23 188 lb (85.3 kg)   BP Readings from Last 3 Encounters:  11/29/23 138/84  07/24/23 128/82  12/13/21 (!) 133/96         Past Medical History:  Diagnosis Date   Difficult airway for intubation    Hyperlipidemia    Nocturia    Prostate cancer Samuel Krueger) urologist-- dr aleene   active survellance since dx Feb 2014--  now has Stage T1c, Gleason (3+4)7, PSA 7.75   Recovering alcoholic in remission (HCC)    since rehab 20 yrs ago-- approx 1995   S/P radiation therapy 07/15/2014   Radioactive Seed Implant- Prostate/proximal seminal vesicles, 14,500 cGy utilizing 74 I-125 seeds implanted in 30 needles with an individual seed activity 0.658 mCi per seed for a total implant activity of 48.6.millicuries.   TIA (transient ischemic attack) 11/20/2020   Tinnitus of both ears    Type 2 diabetes, diet controlled [facility])    Past Surgical History:  Procedure Laterality Date   COLONOSCOPY  2004   Dr.Stark   COLONOSCOPY WITH PROPOFOL  N/A 06/22/2021   Procedure: COLONOSCOPY WITH PROPOFOL ;  Surgeon: Aneita Gwendlyn DASEN, MD;  Location: [facility];  Service:  Gastroenterology;  Laterality: N/A;   POLYPECTOMY  06/22/2021   Procedure: POLYPECTOMY;  Surgeon: Aneita Gwendlyn DASEN, MD;  Location: [facility];  Service: Gastroenterology;;   PROSTATE BIOPSY  03/26/2014   RADIOACTIVE SEED IMPLANT N/A 07/15/2014   Procedure: RADIOACTIVE SEED IMPLANT/BRACHYTHERAPY IMPLANT;  Surgeon: Oliva aleene, MD;  Location: [facility];  Service: Urology;  Laterality: N/A;    reports that he quit smoking about 23 years ago. His smoking use included cigarettes. He started smoking about 24 years ago. He has a 0.8 pack-year smoking history. He has never used smokeless tobacco. He reports that he does not drink alcohol and does not use drugs. family history includes Alcohol abuse in his mother; HIV/AIDS in his brother; Heart disease in his mother; Hypertension in his mother; Mental illness in an other family member. Allergies  Allergen Reactions   Jardiance  [Empagliflozin ] Diarrhea   Metformin And Related Other (See Comments)    Chest tightness   Shrimp [Shellfish Allergy] Nausea Only and Other (See Comments)    strange GI reaction/stomach turns over- no breathing issues, however   Current Outpatient Medications on File Prior to Visit  Medication Sig Dispense Refill   Blood Glucose Monitoring Suppl (ONETOUCH VERIO) w/Device KIT Use as directed twice daily E11.9 1 kit 0   clopidogrel  (PLAVIX ) 75 MG tablet Take 1 tablet (  75 mg total) by mouth daily. 90 tablet 3   Continuous Blood Gluc Receiver (FREESTYLE LIBRE 2 READER) DEVI USE TO CHECK BLOOD SUGAR TWICE DAILY 1 each 0   Continuous Glucose Sensor (FREESTYLE LIBRE 2 SENSOR) MISC USE TO CHECK BLOOD SUGAR TWICE DAILY 6 each 7   glipiZIDE  (GLUCOTROL  XL) 2.5 MG 24 hr tablet Take 1 tablet (2.5 mg total) by mouth daily with breakfast. 90 tablet 3   Lancets MISC Use as directed twice daily E11.9 200 each 3   Omega-3 Fatty Acids (FISH OIL PO) Take by mouth.     ONETOUCH VERIO test strip USE AS DIRECTED TWICE A DAY 200  strip 12   atorvastatin  (LIPITOR) 40 MG tablet Take 1 tablet (40 mg total) by mouth daily. 90 tablet 3   No current facility-administered medications on file prior to visit.        ROS:  All others reviewed and negative.  Objective        PE:  BP 138/84   Temp 98 F (36.7 C) (Temporal)   Ht 5' 10 (1.778 m)   Wt 191 lb (86.6 kg)   SpO2 97%   BMI 27.41 kg/m                 Constitutional: Pt appears in NAD               HENT: Head: NCAT.                Right Ear: External ear normal.                 Left Ear: External ear normal.                Eyes: . Pupils are equal, round, and reactive to light. Conjunctivae and EOM are normal               Nose: without d/c or deformity               Neck: Neck supple. Gross normal ROM               Cardiovascular: Normal rate and regular rhythm.                 Pulmonary/Chest: Effort normal and breath sounds without rales or wheezing.                Abd:  Soft, NT, mild distension, + BS, no organomegaly               Neurological: Pt is alert. At baseline orientation,               Skin: Skin is warm. No rashes, no other new lesions, LE edema - none               Psychiatric: Pt behavior is normal without agitation   Micro: none  Cardiac tracings I have personally interpreted today:  none  Pertinent Radiological findings (summarize): none   Lab Results  Component Value Date   WBC 5.4 07/24/2023   HGB 13.9 07/24/2023   HCT 42.9 07/24/2023   PLT 191.0 07/24/2023   GLUCOSE 130 (H) 07/24/2023   CHOL 207 (H) 07/24/2023   TRIG 62.0 07/24/2023   HDL 59.50 07/24/2023   LDLDIRECT 98.0 08/02/2019   LDLCALC 135 (H) 07/24/2023   ALT 9 07/24/2023   AST 19 07/24/2023   NA 140 07/24/2023   K 4.3 07/24/2023   CL 103 07/24/2023  CREATININE 1.09 07/24/2023   BUN 18 07/24/2023   CO2 31 07/24/2023   TSH 0.44 07/24/2023   PSA 0.02 (L) 07/24/2023   INR 1.0 12/13/2021   HGBA1C 7.4 (H) 07/24/2023   MICROALBUR <0.7 07/24/2023    Assessment/Plan:  Samuel Krueger is a 78 y.o. Black or African American [2] male with  has a past medical history of Difficult airway for intubation, Hyperlipidemia, Nocturia, Prostate cancer (HCC) (urologist-- dr aleene), Recovering alcoholic in remission [facility] [place]), S/P radiation therapy (07/15/2014), TIA (transient ischemic attack) (11/20/2020), Tinnitus of both ears, and Type 2 diabetes, diet controlled (HCC).  Diabetes (HCC) Lab Results  Component Value Date   HGBA1C 7.4 (H) 07/24/2023   Mild uncontrolled, pt to continue current medical treatment glucotrol  xl 2.5 mg and recheck lab;  Without insulin , with hyperglycemia;  also refer optho (pt asks for change in optho provider)  Hyperlipidemia Lab Results  Component Value Date   LDLCALC 135 (H) 07/24/2023   Uncontrolled after pt missed his cholesterol med for some time, now taking it daily, pt to continue current statin lipitor 40 mg and f/u lab next visit, declines lab today   Left lumbar radiculopathy Persistent, pt for orthopedic referral  Constipation Worsening recently but afeb, no  pain or n/v or bleeding - ok for start Miralax  17 gm daily, colace 100 mg bid, and dulcolox prn only  Followup: Return in about 8 weeks (around 01/24/2024).  Lynwood Rush, MD 11/29/2023 1:49 PM [facility]

## 2023-12-20 ENCOUNTER — Ambulatory Visit: Admitting: Physical Medicine and Rehabilitation

## 2023-12-27 ENCOUNTER — Ambulatory Visit: Admitting: Physical Medicine and Rehabilitation

## 2023-12-27 ENCOUNTER — Encounter: Payer: Self-pay | Admitting: Physical Medicine and Rehabilitation

## 2023-12-27 DIAGNOSIS — M5416 Radiculopathy, lumbar region: Secondary | ICD-10-CM

## 2023-12-27 DIAGNOSIS — M5116 Intervertebral disc disorders with radiculopathy, lumbar region: Secondary | ICD-10-CM | POA: Diagnosis not present

## 2023-12-27 DIAGNOSIS — G8929 Other chronic pain: Secondary | ICD-10-CM

## 2023-12-27 NOTE — Progress Notes (Signed)
 Pain Scale   Average Pain 4 Patient advised she hs chronic lower back pain radiating to left leg and causing numbness to left leg        +Driver, -BT, -Dye Allergies.

## 2023-12-27 NOTE — Progress Notes (Signed)
 Samuel Krueger - 78 y.o. male MRN [id number]  Date of birth: 10-Feb-1945  Office Visit Note: Visit Date: 12/27/2023 PCP: Norleen Lynwood ORN, MD Referred by: Norleen Lynwood ORN, MD  Subjective: Chief Complaint  Patient presents with   Lower Back - Pain   HPI: Samuel Krueger is a 78 y.o. male who comes in today per the request of Dr. Lynwood Norleen for evaluation of chronic, worsening and severe left sided lower back pain radiating to buttock and posterior leg to heel. Also reports numbness/tingling to left lower extremity. Pain ongoing for several years, worsens with prolonged sitting. He describes pain as tight and stiff sensation, currently rates as 5 out of 10. Some relief of pain with home exercise regimen, rest and use of medications. Lumbar MRI imaging from 2021 shows left more than right S1 impingement in the subarticular recesses at L5-S1 due to a disc protrusion. Bulky left-sided endplate and facet spurring with left foraminal impingement. Moderate spinal canal stenosis at L3-L4. No history of lumbar surgery/injections. Patient denies focal weakness. No recent trauma or falls.       Review of Systems  Musculoskeletal:  Positive for back pain.  Neurological:  Positive for tingling. Negative for focal weakness and weakness.  All other systems reviewed and are negative.  Otherwise per HPI.  Assessment & Plan: Visit Diagnoses:    ICD-10-CM   1. Chronic left-sided low back pain with left-sided sciatica  M54.42    G89.29     2. Lumbar radiculopathy  M54.16     3. Intervertebral disc disorders with radiculopathy, lumbar region  M51.16        Plan: Findings:  Chronic, worsening and severe left sided lower back pain radiating to buttock and posterior leg to heel. Paresthesias to left leg. Patient continues to have pain despite good conservative therapies such as home exercise regimen, rest and use of medications. Patients clinical presentation and exam are consistent with lumbar  radiculopathy, more of S1 nerve pattern. We discussed treatment plan in detail today. Prior lumbar MRI imaging from 2021 shows disc herniation at L5-S1 that could be a pain generator for him. I discussed possibility of performing left L5-S1 interlaminar epidural steroid injection, however patient would like some time to think about his options. I did provide him with educational information regarding lumbar epidural steroid injections. I encouraged him to let us  know if he would like to proceed with injection. Should his pain persist post injection would recommend obtaining new lumbar MRI imaging. His exam today is non focal, good strength noted to bilateral lower extremities.     Meds & Orders: No orders of the defined types were placed in this encounter.  No orders of the defined types were placed in this encounter.   Follow-up: Return if symptoms worsen or fail to improve.   Procedures: No procedures performed      Clinical History: Narrative & Impression CLINICAL DATA:  Left leg numbness   EXAM: MRI LUMBAR SPINE WITHOUT CONTRAST   TECHNIQUE: Multiplanar, multisequence MR imaging of the lumbar spine was performed. No intravenous contrast was administered.   COMPARISON:  None.   FINDINGS: Segmentation: Standard lumbar numbering based on the available coverage   Alignment:  Levoscoliosis   Vertebrae:  No fracture, evidence of discitis, or bone lesion.   Conus medullaris and cauda equina: Conus extends to the L1 level. Conus and cauda equina appear normal.   Paraspinal and other soft tissues: Negative   Disc levels:   T12-  L1: Unremarkable.   L1-L2: Mild disc narrowing and bulging.  Minor facet spurring.   L2-L3: Disc narrowing and bulging with mild endplate spurring. Mild facet spurring. No impingement   L3-L4: Disc narrowing and bulging with endplate spurring. Posterior element hypertrophy. Moderate thecal sac narrowing. Moderate right foraminal narrowing.    L4-L5: Disc narrowing and bulging with degenerative facet spurring. Mild spinal stenosis. Symmetric subarticular recess narrowing that is noncompressive. Moderate right foraminal stenosis   L5-S1:Disc narrowing and bulging with a downward pointing protrusion impinging on the right more than left S1 nerve root. Asymmetric left facet osteoarthritis and left foraminal impingement   IMPRESSION: 1. Diffuse degenerative disease with levoscoliosis. 2. L5-S1 left more than right S1 impingement in the subarticular recesses due to a disc protrusion. Bulky left-sided endplate and facet spurring with left foraminal impingement. 3. L3-4 and L4-5 moderate right foraminal narrowing. 4. L3-4 moderate spinal stenosis.     Electronically Signed   By: Cassondra Roulette M.D.   On: 09/05/2019 09:12   He reports that he quit smoking about 23 years ago. His smoking use included cigarettes. He started smoking about 24 years ago. He has a 0.8 pack-year smoking history. He has never used smokeless tobacco.  Recent Labs    07/24/23 1111  HGBA1C 7.4*    Objective:  VS:  HT:    WT:   BMI:     BP:   HR: bpm  TEMP: ( )  RESP:  Physical Exam Vitals and nursing note reviewed.  HENT:     Head: Normocephalic and atraumatic.     Right Ear: External ear normal.     Left Ear: External ear normal.     Nose: Nose normal.     Mouth/Throat:     Mouth: Mucous membranes are moist.  Eyes:     Extraocular Movements: Extraocular movements intact.  Cardiovascular:     Rate and Rhythm: Normal rate.     Pulses: Normal pulses.  Pulmonary:     Effort: Pulmonary effort is normal.  Abdominal:     General: Abdomen is flat. There is no distension.  Musculoskeletal:        General: Tenderness present.     Cervical back: Normal range of motion.     Comments: Patient rises from seated position to standing without difficulty. Good lumbar range of motion. No pain noted with facet loading. 5/5 strength noted with  bilateral hip flexion, knee flexion/extension, ankle dorsiflexion/plantarflexion and EHL. No clonus noted bilaterally. No pain upon palpation of greater trochanters. No pain with internal/external rotation of bilateral hips. Sensation intact bilaterally. Dysesthesias noted to left S1 dermatome. Negative slump test bilaterally. Ambulates without aid, gait steady.     Skin:    General: Skin is warm and dry.     Capillary Refill: Capillary refill takes less than 2 seconds.  Neurological:     General: No focal deficit present.     Mental Status: He is alert and oriented to person, place, and time.  Psychiatric:        Mood and Affect: Mood normal.        Behavior: Behavior normal.     Ortho Exam  Imaging: No results found.  Past Medical/Family/Surgical/Social History: Medications & Allergies reviewed per EMR, new medications updated. Patient Active Problem List   Diagnosis Date Noted   Abnormal TSH 07/25/2023   Encounter for well adult exam with abnormal findings 11/10/2021   Benign neoplasm of ascending colon    Bilateral posterior neck  pain 01/27/2021   CVA (cerebral vascular accident) (HCC) 12/25/2020   Slurred speech 11/20/2020   History of stroke 11/20/2020   Erectile dysfunction 07/23/2020   Carcinoma of prostate (HCC) 07/23/2020   Hypertrophy of prostate without urinary obstruction and other lower urinary tract symptoms (LUTS) 07/23/2020   Irregular heart beat 07/23/2020   Dark stools 07/23/2020   Constipation 07/25/2019   Left lumbar radiculopathy 07/25/2019   Vitamin D  deficiency 01/14/2019   Hyperlipidemia 03/30/2016   Recovering alcoholic in remission [facility])    Prostate cancer (HCC)    Diabetes (HCC)    S/P radiation therapy 07/15/2014   Elevated blood pressure reading without diagnosis of hypertension 11/05/2011   Alcohol use 11/02/2011   Past Medical History:  Diagnosis Date   Difficult airway for intubation    Hyperlipidemia    Nocturia    Prostate cancer  [facility]) urologist-- dr aleene   active survellance since dx Feb 2014--  now has Stage T1c, Gleason (3+4)7, PSA 7.75   Recovering alcoholic in remission (HCC)    since rehab 20 yrs ago-- approx 1995   S/P radiation therapy 07/15/2014   Radioactive Seed Implant- Prostate/proximal seminal vesicles, 14,500 cGy utilizing 74 I-125 seeds implanted in 30 needles with an individual seed activity 0.658 mCi per seed for a total implant activity of 48.6.millicuries.   TIA (transient ischemic attack) 11/20/2020   Tinnitus of both ears    Type 2 diabetes, diet controlled (HCC)    Family History  Problem Relation Age of Onset   Alcohol abuse Mother    Heart disease Mother    Hypertension Mother    HIV/AIDS Brother    Mental illness Other    Colon cancer Neg Hx    Esophageal cancer Neg Hx    Pancreatic cancer Neg Hx    Stomach cancer Neg Hx    Past Surgical History:  Procedure Laterality Date   COLONOSCOPY  2004   Dr.Stark   COLONOSCOPY WITH PROPOFOL  N/A 06/22/2021   Procedure: COLONOSCOPY WITH PROPOFOL ;  Surgeon: Aneita Gwendlyn DASEN, MD;  Location: [facility];  Service: Gastroenterology;  Laterality: N/A;   POLYPECTOMY  06/22/2021   Procedure: POLYPECTOMY;  Surgeon: Aneita Gwendlyn DASEN, MD;  Location: [facility];  Service: Gastroenterology;;   PROSTATE BIOPSY  03/26/2014   RADIOACTIVE SEED IMPLANT N/A 07/15/2014   Procedure: RADIOACTIVE SEED IMPLANT/BRACHYTHERAPY IMPLANT;  Surgeon: Oliva Aleene, MD;  Location: [facility];  Service: Urology;  Laterality: N/A;   Social History   Occupational History   Occupation: retired  Tobacco Use   Smoking status: Former    Current packs/day: 0.00    Average packs/day: 1 pack/day for 0.8 years (0.8 ttl pk-yrs)    Types: Cigarettes    Start date: 09/19/1999    Quit date: 07/07/2000    Years since quitting: 23.4   Smokeless tobacco: Never  Vaping Use   Vaping status: Never Used  Substance and Sexual Activity   Alcohol use: No    Comment:  Recovering alcoholic since 1993   Drug use: No   Sexual activity: Not Currently

## 2024-01-20 ENCOUNTER — Other Ambulatory Visit: Payer: Self-pay | Admitting: Internal Medicine

## 2024-01-22 ENCOUNTER — Other Ambulatory Visit: Payer: Self-pay | Admitting: Internal Medicine

## 2024-01-22 ENCOUNTER — Other Ambulatory Visit: Payer: Self-pay

## 2024-01-22 MED ORDER — FREESTYLE LIBRE 3 PLUS SENSOR MISC: 3 refills | Status: AC

## 2024-01-24 ENCOUNTER — Ambulatory Visit: Admitting: Internal Medicine

## 2024-01-29 ENCOUNTER — Ambulatory Visit: Payer: Self-pay | Admitting: Internal Medicine

## 2024-01-29 ENCOUNTER — Other Ambulatory Visit: Payer: Self-pay | Admitting: Internal Medicine

## 2024-01-29 ENCOUNTER — Ambulatory Visit: Admitting: Internal Medicine

## 2024-01-29 ENCOUNTER — Encounter: Payer: Self-pay | Admitting: Internal Medicine

## 2024-01-29 VITALS — BP 140/100 | HR 77 | Temp 97.8°F | Ht 70.0 in | Wt 191.0 lb

## 2024-01-29 DIAGNOSIS — Z8546 Personal history of malignant neoplasm of prostate: Secondary | ICD-10-CM

## 2024-01-29 DIAGNOSIS — Z7984 Long term (current) use of oral hypoglycemic drugs: Secondary | ICD-10-CM | POA: Diagnosis not present

## 2024-01-29 DIAGNOSIS — E1165 Type 2 diabetes mellitus with hyperglycemia: Secondary | ICD-10-CM

## 2024-01-29 DIAGNOSIS — E7800 Pure hypercholesterolemia, unspecified: Secondary | ICD-10-CM | POA: Diagnosis not present

## 2024-01-29 DIAGNOSIS — E559 Vitamin D deficiency, unspecified: Secondary | ICD-10-CM

## 2024-01-29 DIAGNOSIS — R030 Elevated blood-pressure reading, without diagnosis of hypertension: Secondary | ICD-10-CM | POA: Diagnosis not present

## 2024-01-29 LAB — HEPATIC FUNCTION PANEL
ALT: 9 U/L (ref 3–53)
AST: 18 U/L (ref 5–37)
Albumin: 4.3 g/dL (ref 3.5–5.2)
Alkaline Phosphatase: 42 U/L (ref 39–117)
Bilirubin, Direct: 0.1 mg/dL (ref 0.1–0.3)
Total Bilirubin: 0.4 mg/dL (ref 0.2–1.2)
Total Protein: 7.5 g/dL (ref 6.0–8.3)

## 2024-01-29 LAB — BASIC METABOLIC PANEL WITH GFR
BUN: 18 mg/dL (ref 6–23)
CO2: 31 meq/L (ref 19–32)
Calcium: 9.5 mg/dL (ref 8.4–10.5)
Chloride: 102 meq/L (ref 96–112)
Creatinine, Ser: 1.04 mg/dL (ref 0.40–1.50)
GFR: 68.31 mL/min
Glucose, Bld: 99 mg/dL (ref 70–99)
Potassium: 4.2 meq/L (ref 3.5–5.1)
Sodium: 139 meq/L (ref 135–145)

## 2024-01-29 LAB — LIPID PANEL
Cholesterol: 166 mg/dL (ref 28–200)
HDL: 53.2 mg/dL
LDL Cholesterol: 97 mg/dL (ref 10–99)
NonHDL: 112.66
Total CHOL/HDL Ratio: 3
Triglycerides: 77 mg/dL (ref 10.0–149.0)
VLDL: 15.4 mg/dL (ref 0.0–40.0)

## 2024-01-29 LAB — HEMOGLOBIN A1C: Hgb A1c MFr Bld: 7.8 % — ABNORMAL HIGH (ref 4.6–6.5)

## 2024-01-29 MED ORDER — GLIPIZIDE ER 2.5 MG PO TB24: 2.5000 mg | ORAL_TABLET | Freq: Every day | ORAL | 3 refills | Status: AC

## 2024-01-29 NOTE — Progress Notes (Signed)
 Patient ID: Samuel Krueger, male   DOB: Jul 18, 1945, 78 y.o.   MRN: [id number]        Chief Complaint: follow up DM, elevated BP, hld, low vit d, history of prostate ca       HPI:  Samuel Krueger is a 78 y.o. male overall doing ok.  Pt denies chest pain, increased sob or doe, wheezing, orthopnea, PND, increased LE swelling, palpitations, dizziness or syncope.   Pt denies polydipsia, polyuria, or new focal neuro s/s.    Pt denies fever, wt loss, night sweats, loss of appetite, or other constitutional symptoms         Wt Readings from Last 3 Encounters:  01/29/24 191 lb (86.6 kg)  11/29/23 191 lb (86.6 kg)  10/02/23 188 lb (85.3 kg)   BP Readings from Last 3 Encounters:  01/29/24 (!) 140/100  11/29/23 138/84  07/24/23 128/82         Past Medical History:  Diagnosis Date   Difficult airway for intubation    Hyperlipidemia    Nocturia    Prostate cancer [facility]) urologist-- dr aleene   active survellance since dx Feb 2014--  now has Stage T1c, Gleason (3+4)7, PSA 7.75   Recovering alcoholic in remission (HCC)    since rehab 20 yrs ago-- approx 1995   S/P radiation therapy 07/15/2014   Radioactive Seed Implant- Prostate/proximal seminal vesicles, 14,500 cGy utilizing 74 I-125 seeds implanted in 30 needles with an individual seed activity 0.658 mCi per seed for a total implant activity of 48.6.millicuries.   TIA (transient ischemic attack) 11/20/2020   Tinnitus of both ears    Type 2 diabetes, diet controlled [facility])    Past Surgical History:  Procedure Laterality Date   COLONOSCOPY  2004   Dr.Stark   COLONOSCOPY WITH PROPOFOL  N/A 06/22/2021   Procedure: COLONOSCOPY WITH PROPOFOL ;  Surgeon: Aneita Gwendlyn DASEN, MD;  Location: [facility];  Service: Gastroenterology;  Laterality: N/A;   POLYPECTOMY  06/22/2021   Procedure: POLYPECTOMY;  Surgeon: Aneita Gwendlyn DASEN, MD;  Location: [facility];  Service: Gastroenterology;;   PROSTATE BIOPSY  03/26/2014   RADIOACTIVE SEED IMPLANT N/A 07/15/2014    Procedure: RADIOACTIVE SEED IMPLANT/BRACHYTHERAPY IMPLANT;  Surgeon: Oliva Aleene, MD;  Location: [facility];  Service: Urology;  Laterality: N/A;    reports that he quit smoking about 23 years ago. His smoking use included cigarettes. He started smoking about 24 years ago. He has a 0.8 pack-year smoking history. He has never used smokeless tobacco. He reports that he does not drink alcohol and does not use drugs. family history includes Alcohol abuse in his mother; HIV/AIDS in his brother; Heart disease in his mother; Hypertension in his mother; Mental illness in an other family member. Allergies[1] Medications Ordered Prior to Encounter[2]      ROS:  All others reviewed and negative.  Objective        PE:  BP (!) 140/100 (BP Location: Left Arm, Patient Position: Sitting)   Pulse 77   Temp 97.8 F (36.6 C) (Oral)   Ht 5' 10 (1.778 m)   Wt 191 lb (86.6 kg)   SpO2 99%   BMI 27.41 kg/m                 Constitutional: Pt appears in NAD               HENT: Head: NCAT.                Right  Ear: External ear normal.                 Left Ear: External ear normal.                Eyes: . Pupils are equal, round, and reactive to light. Conjunctivae and EOM are normal               Nose: without d/c or deformity               Neck: Neck supple. Gross normal ROM               Cardiovascular: Normal rate and regular rhythm.                 Pulmonary/Chest: Effort normal and breath sounds without rales or wheezing.                Abd:  Soft, NT, ND, + BS, no organomegaly               Neurological: Pt is alert. At baseline orientation, motor grossly intact               Skin: Skin is warm. No rashes, no other new lesions, LE edema - none               Psychiatric: Pt behavior is normal without agitation   Micro: none  Cardiac tracings I have personally interpreted today:  none  Pertinent Radiological findings (summarize): none   Lab Results  Component Value Date   WBC 5.4  07/24/2023   HGB 13.9 07/24/2023   HCT 42.9 07/24/2023   PLT 191.0 07/24/2023   GLUCOSE 99 01/29/2024   CHOL 166 01/29/2024   TRIG 77.0 01/29/2024   HDL 53.20 01/29/2024   LDLDIRECT 98.0 08/02/2019   LDLCALC 97 01/29/2024   ALT 9 01/29/2024   AST 18 01/29/2024   NA 139 01/29/2024   K 4.2 01/29/2024   CL 102 01/29/2024   CREATININE 1.04 01/29/2024   BUN 18 01/29/2024   CO2 31 01/29/2024   TSH 0.44 07/24/2023   PSA 0.02 (L) 07/24/2023   INR 1.0 12/13/2021   HGBA1C 7.8 (H) 01/29/2024   MICROALBUR <0.7 07/24/2023   Assessment/Plan:  Samuel Krueger is a 78 y.o. Black or African American [2] male with  has a past medical history of Difficult airway for intubation, Hyperlipidemia, Nocturia, Prostate cancer (HCC) (urologist-- dr aleene), Recovering alcoholic in remission [facility]), S/P radiation therapy (07/15/2014), TIA (transient ischemic attack) (11/20/2020), Tinnitus of both ears, and Type 2 diabetes, diet controlled (HCC).  Diabetes (HCC) With hyperglycemia  Lab Results  Component Value Date   HGBA1C 7.8 (H) 01/29/2024   uncontrolled, pt to increase glucotrol  xl 5 mg every day, also refer optho for yearly exam   History of prostate cancer Lab Results  Component Value Date   PSA 0.02 (L) 07/24/2023   PSA 0.00 (L) 11/10/2021   PSA 0.02 (L) 12/25/2020   Stable, continue current active surveillance  Vitamin D  deficiency Last vitamin D  Lab Results  Component Value Date   VD25OH 43.66 07/24/2023   Stable, cont oral replacement   Hyperlipidemia Lab Results  Component Value Date   LDLCALC 97 01/29/2024   uncontrolled, pt to continue current statin lipitor 40 mg every day and lower chol diet, declines other change for now   Elevated blood pressure reading without diagnosis of hypertension BP Readings from Last 3 Encounters:  01/29/24 (!) 140/100  11/29/23  138/84  07/24/23 128/82   Uncontrolled here, but pt states controlled at home, pt to continue diet, wt  control  Followup: Return in about 6 months (around 07/29/2024).  Samuel Rush, MD 01/31/2024 10:16 PM [facility]     [1]  Allergies Allergen Reactions   Jardiance  [Empagliflozin ] Diarrhea   Metformin And Related Other (See Comments)    Chest tightness   Shrimp [Shellfish Allergy] Nausea Only and Other (See Comments)    strange GI reaction/stomach turns over- no breathing issues, however  [2]  Current Outpatient Medications on File Prior to Visit  Medication Sig Dispense Refill   atorvastatin  (LIPITOR) 40 MG tablet Take 1 tablet (40 mg total) by mouth daily. 90 tablet 3   clopidogrel  (PLAVIX ) 75 MG tablet Take 1 tablet (75 mg total) by mouth daily. 90 tablet 3   Continuous Blood Gluc Receiver (FREESTYLE LIBRE 2 READER) DEVI USE TO CHECK BLOOD SUGAR TWICE DAILY 1 each 0   Continuous Glucose Sensor (FREESTYLE LIBRE 3 PLUS SENSOR) MISC Change sensor every 15 days. E11.9 6 each 3   docusate sodium  (COLACE) 100 MG capsule Take 1 capsule (100 mg total) by mouth 2 (two) times daily. 60 capsule 5   Lancets MISC Use as directed twice daily E11.9 200 each 3   Omega-3 Fatty Acids (FISH OIL PO) Take by mouth.     ONETOUCH VERIO test strip USE AS DIRECTED TWICE A DAY 200 strip 12   polyethylene glycol powder (GLYCOLAX /MIRALAX ) 17 GM/SCOOP powder Take 17 g by mouth daily. Dissolve 1 capful (17g) in 4-8 ounces of liquid and take by mouth daily. 238 g 2   No current facility-administered medications on file prior to visit.

## 2024-01-29 NOTE — Assessment & Plan Note (Addendum)
 With hyperglycemia  Lab Results  Component Value Date   HGBA1C 7.8 (H) 01/29/2024   uncontrolled, pt to increase glucotrol  xl 5 mg every day, also refer optho for yearly exam

## 2024-01-29 NOTE — Patient Instructions (Signed)
 Please continue all other medications as before, and refills have been done if requested.  Please have the pharmacy call with any other refills you may need.  Please continue your efforts at being more active, low cholesterol diet, and weight control.  You are otherwise up to date with prevention measures today.  Please keep your appointments with your specialists as you may have planned  You will be contacted regarding the referral for: Eye doctor  Please go to the LAB at the blood drawing area for the tests to be done  You will be contacted by phone if any changes need to be made immediately.  Otherwise, you will receive a letter about your results with an explanation, but please check with MyChart first.  Please make an Appointment to return in 6 months, or sooner if needed

## 2024-01-31 ENCOUNTER — Encounter: Payer: Self-pay | Admitting: Internal Medicine

## 2024-01-31 NOTE — Assessment & Plan Note (Signed)
 BP Readings from Last 3 Encounters:  01/29/24 (!) 140/100  11/29/23 138/84  07/24/23 128/82   Uncontrolled here, but pt states controlled at home, pt to continue diet, wt control

## 2024-01-31 NOTE — Assessment & Plan Note (Signed)
 Last vitamin D  Lab Results  Component Value Date   VD25OH 43.66 07/24/2023   Stable, cont oral replacement

## 2024-01-31 NOTE — Assessment & Plan Note (Signed)
 Lab Results  Component Value Date   LDLCALC 97 01/29/2024   uncontrolled, pt to continue current statin lipitor 40 mg every day and lower chol diet, declines other change for now

## 2024-01-31 NOTE — Assessment & Plan Note (Signed)
 Lab Results  Component Value Date   PSA 0.02 (L) 07/24/2023   PSA 0.00 (L) 11/10/2021   PSA 0.02 (L) 12/25/2020   Stable, continue current active surveillance

## 2024-10-04 ENCOUNTER — Ambulatory Visit
